# Patient Record
Sex: Female | Born: 1940 | Race: White | Hispanic: No | Marital: Married | Smoking: Never smoker
Health system: Southern US, Community
[De-identification: ages and names within clinical notes are randomized; demographics above are authoritative.]

## PROBLEM LIST (undated history)

## (undated) DIAGNOSIS — M542 Cervicalgia: Secondary | ICD-10-CM

## (undated) DIAGNOSIS — E039 Hypothyroidism, unspecified: Secondary | ICD-10-CM

## (undated) DIAGNOSIS — I951 Orthostatic hypotension: Secondary | ICD-10-CM

## (undated) DIAGNOSIS — N289 Disorder of kidney and ureter, unspecified: Secondary | ICD-10-CM

## (undated) DIAGNOSIS — F32A Depression, unspecified: Secondary | ICD-10-CM

## (undated) DIAGNOSIS — Z8659 Personal history of other mental and behavioral disorders: Secondary | ICD-10-CM

## (undated) DIAGNOSIS — G43909 Migraine, unspecified, not intractable, without status migrainosus: Secondary | ICD-10-CM

## (undated) DIAGNOSIS — F329 Major depressive disorder, single episode, unspecified: Secondary | ICD-10-CM

## (undated) DIAGNOSIS — M25512 Pain in left shoulder: Secondary | ICD-10-CM

## (undated) DIAGNOSIS — Z87898 Personal history of other specified conditions: Secondary | ICD-10-CM

## (undated) DIAGNOSIS — F419 Anxiety disorder, unspecified: Secondary | ICD-10-CM

## (undated) DIAGNOSIS — E785 Hyperlipidemia, unspecified: Secondary | ICD-10-CM

## (undated) DIAGNOSIS — I4891 Unspecified atrial fibrillation: Secondary | ICD-10-CM

## (undated) DIAGNOSIS — U071 COVID-19: Secondary | ICD-10-CM

## (undated) HISTORY — PX: COLONOSCOPY: SHX174

## (undated) HISTORY — DX: COVID-19: U07.1

## (undated) HISTORY — PX: ABDOMINAL HYSTERECTOMY: SHX81

## (undated) HISTORY — PX: THYROID SURGERY: SHX805

## (undated) HISTORY — DX: Orthostatic hypotension: I95.1

## (undated) HISTORY — PX: CHOLECYSTECTOMY: SHX55

## (undated) HISTORY — DX: Cervicalgia: M54.2

## (undated) HISTORY — PX: JOINT REPLACEMENT: SHX530

## (undated) HISTORY — DX: Anxiety disorder, unspecified: F41.9

## (undated) HISTORY — DX: Disorder of kidney and ureter, unspecified: N28.9

## (undated) HISTORY — DX: Personal history of other mental and behavioral disorders: Z86.59

## (undated) HISTORY — DX: Personal history of other specified conditions: Z87.898

## (undated) HISTORY — PX: TONSILLECTOMY: SUR1361

## (undated) HISTORY — PX: BREAST LUMPECTOMY: SHX2

## (undated) HISTORY — DX: Pain in left shoulder: M25.512

---

## 2002-09-10 ENCOUNTER — Encounter: Admission: RE | Admit: 2002-09-10 | Discharge: 2002-09-20 | Payer: Self-pay | Admitting: Orthopaedic Surgery

## 2007-12-19 ENCOUNTER — Encounter: Admission: RE | Admit: 2007-12-19 | Discharge: 2007-12-19 | Payer: Self-pay | Admitting: Neurology

## 2008-10-01 ENCOUNTER — Telehealth: Payer: Self-pay | Admitting: Internal Medicine

## 2008-10-01 ENCOUNTER — Encounter: Payer: Self-pay | Admitting: Internal Medicine

## 2008-10-02 ENCOUNTER — Ambulatory Visit: Payer: Self-pay | Admitting: Internal Medicine

## 2008-10-02 DIAGNOSIS — J302 Other seasonal allergic rhinitis: Secondary | ICD-10-CM

## 2008-10-02 DIAGNOSIS — R519 Headache, unspecified: Secondary | ICD-10-CM | POA: Insufficient documentation

## 2008-10-02 DIAGNOSIS — J42 Unspecified chronic bronchitis: Secondary | ICD-10-CM | POA: Insufficient documentation

## 2008-10-02 DIAGNOSIS — J329 Chronic sinusitis, unspecified: Secondary | ICD-10-CM | POA: Insufficient documentation

## 2008-10-02 DIAGNOSIS — R51 Headache: Secondary | ICD-10-CM

## 2008-10-02 DIAGNOSIS — J3089 Other allergic rhinitis: Secondary | ICD-10-CM

## 2008-10-02 LAB — CONVERTED CEMR LAB
Basophils Absolute: 0 10*3/uL (ref 0.0–0.1)
Basophils Relative: 0.2 % (ref 0.0–3.0)
Eosinophils Absolute: 0.3 10*3/uL (ref 0.0–0.7)
Eosinophils Relative: 2.8 % (ref 0.0–5.0)
HCT: 39 % (ref 36.0–46.0)
Hemoglobin: 13.5 g/dL (ref 12.0–15.0)
IgE (Immunoglobulin E), Serum: 3 intl units/mL (ref 0.0–180.0)
Lymphocytes Relative: 28.3 % (ref 12.0–46.0)
Lymphs Abs: 3.5 10*3/uL (ref 0.7–4.0)
MCHC: 34.6 g/dL (ref 30.0–36.0)
MCV: 91.9 fL (ref 78.0–100.0)
Monocytes Absolute: 1.5 10*3/uL — ABNORMAL HIGH (ref 0.1–1.0)
Monocytes Relative: 12.2 % — ABNORMAL HIGH (ref 3.0–12.0)
Neutro Abs: 7 10*3/uL (ref 1.4–7.7)
Neutrophils Relative %: 56.5 % (ref 43.0–77.0)
Platelets: 266 10*3/uL (ref 150.0–400.0)
RBC: 4.24 M/uL (ref 3.87–5.11)
RDW: 13 % (ref 11.5–14.6)
WBC: 12.3 10*3/uL — ABNORMAL HIGH (ref 4.5–10.5)

## 2008-10-04 ENCOUNTER — Telehealth: Payer: Self-pay | Admitting: Internal Medicine

## 2008-10-09 ENCOUNTER — Ambulatory Visit: Payer: Self-pay | Admitting: Internal Medicine

## 2008-10-15 ENCOUNTER — Telehealth (INDEPENDENT_AMBULATORY_CARE_PROVIDER_SITE_OTHER): Payer: Self-pay | Admitting: *Deleted

## 2008-10-16 ENCOUNTER — Ambulatory Visit: Payer: Self-pay | Admitting: Internal Medicine

## 2008-10-28 ENCOUNTER — Ambulatory Visit: Payer: Self-pay | Admitting: Internal Medicine

## 2009-03-20 ENCOUNTER — Ambulatory Visit: Payer: Self-pay | Admitting: Internal Medicine

## 2009-04-30 ENCOUNTER — Ambulatory Visit: Payer: Self-pay | Admitting: Internal Medicine

## 2009-09-04 ENCOUNTER — Encounter: Admission: RE | Admit: 2009-09-04 | Discharge: 2009-09-04 | Payer: Self-pay | Admitting: Neurology

## 2010-04-30 ENCOUNTER — Ambulatory Visit: Payer: Self-pay | Admitting: Internal Medicine

## 2011-10-20 ENCOUNTER — Other Ambulatory Visit: Payer: Self-pay | Admitting: Neurology

## 2011-10-20 DIAGNOSIS — R51 Headache: Secondary | ICD-10-CM

## 2011-10-20 DIAGNOSIS — R42 Dizziness and giddiness: Secondary | ICD-10-CM

## 2011-10-23 ENCOUNTER — Ambulatory Visit
Admission: RE | Admit: 2011-10-23 | Discharge: 2011-10-23 | Disposition: A | Discharge status: Home / Self Care | Payer: Medicare Other | Source: Ambulatory Visit | Attending: Neurology | Admitting: Neurology

## 2011-10-23 DIAGNOSIS — R51 Headache: Secondary | ICD-10-CM

## 2011-10-23 DIAGNOSIS — R42 Dizziness and giddiness: Secondary | ICD-10-CM

## 2012-01-11 ENCOUNTER — Emergency Department (HOSPITAL_BASED_OUTPATIENT_CLINIC_OR_DEPARTMENT_OTHER)
Admission: EM | Admit: 2012-01-11 | Discharge: 2012-01-11 | Disposition: A | Discharge status: Home / Self Care | Payer: Medicare Other | Attending: Emergency Medicine | Admitting: Emergency Medicine

## 2012-01-11 ENCOUNTER — Encounter (HOSPITAL_BASED_OUTPATIENT_CLINIC_OR_DEPARTMENT_OTHER): Payer: Self-pay

## 2012-01-11 ENCOUNTER — Emergency Department (HOSPITAL_BASED_OUTPATIENT_CLINIC_OR_DEPARTMENT_OTHER): Payer: Medicare Other

## 2012-01-11 DIAGNOSIS — E785 Hyperlipidemia, unspecified: Secondary | ICD-10-CM | POA: Insufficient documentation

## 2012-01-11 DIAGNOSIS — G43809 Other migraine, not intractable, without status migrainosus: Secondary | ICD-10-CM

## 2012-01-11 DIAGNOSIS — E079 Disorder of thyroid, unspecified: Secondary | ICD-10-CM | POA: Insufficient documentation

## 2012-01-11 DIAGNOSIS — R4182 Altered mental status, unspecified: Secondary | ICD-10-CM

## 2012-01-11 HISTORY — DX: Depression, unspecified: F32.A

## 2012-01-11 HISTORY — DX: Hyperlipidemia, unspecified: E78.5

## 2012-01-11 HISTORY — DX: Migraine, unspecified, not intractable, without status migrainosus: G43.909

## 2012-01-11 HISTORY — DX: Major depressive disorder, single episode, unspecified: F32.9

## 2012-01-11 HISTORY — DX: Hypothyroidism, unspecified: E03.9

## 2012-01-11 LAB — CBC WITH DIFFERENTIAL/PLATELET
Basophils Absolute: 0 10*3/uL (ref 0.0–0.1)
Basophils Relative: 0 % (ref 0–1)
Eosinophils Absolute: 0.2 10*3/uL (ref 0.0–0.7)
Eosinophils Relative: 3 % (ref 0–5)
Hemoglobin: 13.4 g/dL (ref 12.0–15.0)
MCV: 90.7 fL (ref 78.0–100.0)
Monocytes Absolute: 0.5 10*3/uL (ref 0.1–1.0)
Neutro Abs: 3.4 10*3/uL (ref 1.7–7.7)
RBC: 4.51 MIL/uL (ref 3.87–5.11)
WBC: 5.6 10*3/uL (ref 4.0–10.5)

## 2012-01-11 LAB — COMPREHENSIVE METABOLIC PANEL
ALT: 11 U/L (ref 0–35)
AST: 21 U/L (ref 0–37)
Alkaline Phosphatase: 58 U/L (ref 39–117)
GFR calc Af Amer: 73 mL/min — ABNORMAL LOW (ref >90)
Sodium: 137 mEq/L (ref 135–145)
Total Bilirubin: 0.3 mg/dL (ref 0.3–1.2)
Total Protein: 6.4 g/dL (ref 6.0–8.3)

## 2012-01-11 LAB — URINALYSIS, ROUTINE W REFLEX MICROSCOPIC
Hgb urine dipstick: NEGATIVE
Ketones, ur: NEGATIVE mg/dL
Leukocytes, UA: NEGATIVE
Nitrite: NEGATIVE
Protein, ur: NEGATIVE mg/dL
Urobilinogen, UA: 0.2 mg/dL (ref 0.0–1.0)
pH: 7 (ref 5.0–8.0)

## 2012-01-11 MED ORDER — SODIUM CHLORIDE 0.9 % IV BOLUS (SEPSIS)
1000.0000 mL | Freq: Once | INTRAVENOUS | Status: AC
  Administered 2012-01-11: 1000 mL via INTRAVENOUS

## 2012-01-11 NOTE — ED Notes (Signed)
Husband reports pt was CAOx4 at 11am today, he left the house, returned at 1400 and was confused and not acting right.

## 2012-01-11 NOTE — ED Notes (Signed)
Patient ambulatory to the restroom with assistance

## 2012-01-11 NOTE — ED Provider Notes (Signed)
History     CSN: [id number]  Arrival date & time 01/11/12  1557   First MD Initiated Contact with Patient 01/11/12 1630      Chief Complaint  Patient presents with  . Altered Mental Status    (Consider location/radiation/quality/duration/timing/severity/associated sxs/prior treatment) HPI Comments: Patient presents after husband found her to be confused and disoriented.  She was sitting in a recliner with spilled food on her chest and abdomen.  He left her around 11AM and returned to find her this way at about 2PM.  She reports having had a headache this morning but denies this at present.  There is no arm or leg weakness.    She has a history of migraines and recently was seen by Dr. Anne Hahn.  She had an mri about 3 months ago that was okay.    Patient is a 71 y.o. female presenting with altered mental status. The history is provided by the patient.  Altered Mental Status This is a new problem. The problem occurs constantly. The problem has not changed since onset.Associated symptoms include headaches. Pertinent negatives include no chest pain. Nothing aggravates the symptoms. Nothing relieves the symptoms. She has tried nothing for the symptoms. The treatment provided no relief.    Past Medical History  Diagnosis Date  . Migraines   . Thyroid disease   . Hyperlipidemia   . Depression   . Hypothyroid     Past Surgical History  Procedure Date  . Joint replacement   . Abdominal hysterectomy   . Thyroid surgery   . Cholecystectomy     No family history on file.  History  Substance Use Topics  . Smoking status: Never Smoker   . Smokeless tobacco: Never Used  . Alcohol Use: No    OB History    Grav Para Term Preterm Abortions TAB SAB Ect Mult Living                  Review of Systems  Cardiovascular: Negative for chest pain.  Neurological: Positive for headaches.  Psychiatric/Behavioral: Positive for altered mental status.  All other systems reviewed and are  negative.    Allergies  Review of patient's allergies indicates no known allergies.  Home Medications  No current outpatient prescriptions on file.  BP 122/67  Pulse 65  Resp 16  Ht 5\' 8"  (1.727 m)  Wt 210 lb (95.255 kg)  BMI 31.93 kg/m2  SpO2 97%  Physical Exam  Nursing note and vitals reviewed. Constitutional: She is oriented to person, place, and time. She appears well-developed and well-nourished. No distress.  HENT:  Head: Normocephalic and atraumatic.  Mouth/Throat: Oropharynx is clear and moist.  Eyes: EOM are normal. Pupils are equal, round, and reactive to light.  Neck: Normal range of motion. Neck supple.  Cardiovascular: Normal rate and regular rhythm.   No murmur heard. Pulmonary/Chest: Effort normal and breath sounds normal. No respiratory distress. She has no wheezes.  Abdominal: Soft. Bowel sounds are normal. She exhibits no distension. There is no tenderness.  Musculoskeletal: Normal range of motion. She exhibits no edema.  Neurological: She is alert and oriented to person, place, and time. No cranial nerve deficit. Coordination normal.  Skin: Skin is warm and dry. She is not diaphoretic.    ED Course  Procedures (including critical care time)   Labs Reviewed  CBC WITH DIFFERENTIAL  COMPREHENSIVE METABOLIC PANEL  URINALYSIS, ROUTINE W REFLEX MICROSCOPIC   No results found.   No diagnosis found.  MDM  The patient presents here for eval after her husband left for a while and found her altered at home in a recliner.  She came here with confusion, then complained of double vision.  Her ct of the head was okay and the labs were unremarkable.  Her symptoms persisted and I elected to pursue this with an mri even though she had a negative in the recent past.  This was unremarkable today as well.  She is feeling better and her symptoms have resolved.  I believe she is stable for discharge with the presumptive diagnosis of complex migraine.  Also considered  was tia/cva which I feel the mri has ruled out.  She was encourage to expedite follow up with Dr. Anne Hahn in the future.        Geoffery Lyons, MD 01/11/12 2004

## 2012-01-11 NOTE — ED Notes (Signed)
Patient transported to MRI 

## 2012-01-11 NOTE — ED Notes (Signed)
Pt. Reports for 1 wk she has been having diarrhea every morning.  Pt. Husband said he came home and found her in the recliner asleep.  Pt. At times is unable to remember things.  Pt. Daughter is nurse at [employer] long.

## 2012-01-11 NOTE — ED Notes (Signed)
Pt. Is unstedy on her feet when standing.

## 2012-03-01 ENCOUNTER — Ambulatory Visit (INDEPENDENT_AMBULATORY_CARE_PROVIDER_SITE_OTHER): Payer: Medicare Other | Admitting: Cardiology

## 2012-03-01 ENCOUNTER — Encounter: Payer: Self-pay | Admitting: Cardiology

## 2012-03-01 VITALS — BP 110/82 | HR 77 | Ht 67.0 in | Wt 205.0 lb

## 2012-03-01 DIAGNOSIS — E785 Hyperlipidemia, unspecified: Secondary | ICD-10-CM

## 2012-03-01 DIAGNOSIS — R51 Headache: Secondary | ICD-10-CM

## 2012-03-01 DIAGNOSIS — I951 Orthostatic hypotension: Secondary | ICD-10-CM

## 2012-03-01 NOTE — Progress Notes (Signed)
HPI: 71 year old female for evaluation of syncope and orthostasis. The patient typically does not have dyspnea on exertion, orthopnea, PND, pedal edema, chest pain or palpitations. On 01/11/2012 the patient was in her kitchen fixing lunch. She has no recall of any events for the subsequent 5 hours. Her husband found her confused. She was taken to the emergency room. She ultimately had an MRI/MRA that was normal. An EEG was unremarkable. Hemoglobin and renal function were normal. Urinalysis negative. She has had no further events. She does state that over the past 4-5 months she notices dizziness with standing. She has not had syncope. There is no associated chest pain or palpitations. Her symptoms resolved after standing for several minutes. If she works for 30 minutes she does begin to notice lightheadedness requiring her to sit down as well. She has lost 10 pounds over the past one year. Her appetite is not good. She has loose bowel movements but no diarrhea. She does not consume significant amounts of fluid. Because of the above cardiology is asked to evaluate. Note neurology has ordered a cortisol level with results pending.  Current Outpatient Prescriptions  Medication Sig Dispense Refill  . buPROPion (WELLBUTRIN XL) 150 MG 24 hr tablet Take 150 mg by mouth daily.      . divalproex (DEPAKOTE) 500 MG DR tablet Take 500 mg by mouth daily.      Marland Kitchen gabapentin (NEURONTIN) 300 MG capsule Take 600 mg by mouth 2 (two) times daily.      Marland Kitchen levothyroxine (SYNTHROID, LEVOTHROID) 150 MCG tablet Take 150 mcg by mouth daily.      . Multiple Vitamins-Minerals (ICAPS PO) Take 1 tablet by mouth daily.      . Omega-3 Fatty Acids (FISH OIL PO) Take 4 tablets by mouth daily.      . sertraline (ZOLOFT) 100 MG tablet 1 1/2  Tab po qd      . simvastatin (ZOCOR) 40 MG tablet Take 40 mg by mouth every evening.      . SUMAtriptan (IMITREX) 100 MG tablet Take 100 mg by mouth every 2 (two) hours as needed.      . zolpidem  (AMBIEN) 10 MG tablet Take 10 mg by mouth at bedtime as needed.        No Known Allergies  Past Medical History  Diagnosis Date  . Migraines   . Hyperlipidemia   . Depression   . Hypothyroid   . Renal insufficiency     Past Surgical History  Procedure Date  . Joint replacement   . Abdominal hysterectomy   . Thyroid surgery   . Cholecystectomy   . Tonsillectomy     History   Social History  . Marital Status: Married    Spouse Name: N/A    Number of Children: 2  . Years of Education: N/A   Occupational History  .     Social History Main Topics  . Smoking status: Never Smoker   . Smokeless tobacco: Never Used  . Alcohol Use: No  . Drug Use: No  . Sexually Active:    Other Topics Concern  . Not on file   Social History Narrative  . No narrative on file    Family History  Problem Relation Age of Onset  . Heart disease Mother     Angina    ROS: no fevers or chills, productive cough, hemoptysis, dysphasia, odynophagia, melena, hematochezia, dysuria, hematuria, rash, seizure activity, orthopnea, PND, pedal edema, claudication. Remaining systems are negative.  Physical  Exam:   Blood pressure 110/82, pulse 77, height 5\' 7"  (1.702 m), weight 205 lb (92.987 kg).  General:  Well developed/well nourished in NAD Skin warm/dry Patient not depressed No peripheral clubbing Back-normal HEENT-normal/normal eyelids Neck supple/normal carotid upstroke bilaterally; no bruits; no JVD; no thyromegaly chest - CTA/ normal expansion CV - RRR/normal S1 and S2; no murmurs, rubs or gallops;  PMI nondisplaced Abdomen -NT/ND, no HSM, no mass, + bowel sounds, no bruit 2+ femoral pulses, no bruits Ext-no edema, chords, 2+ DP Neuro-grossly nonfocal  ECG sinus rhythm at a rate of 78. Nonspecific ST changes.

## 2012-03-01 NOTE — Assessment & Plan Note (Signed)
Patient is describing orthostatic symptoms. She was noted to be orthostatic when evaluated by neurology and is mildly orthostatic today with SBP lying 109 and standing 98. We will plan an echocardiogram to assess LV function. Note it is not clear that she had syncope on August 27 as she apparently was confused for 5 hours. This would make cardiac etiology seem unlikely. Because of her orthostasis I have asked her to increase her by mouth fluid intake and sodium intake. Her laboratories were normal. A cortisol level is pending. If she has continuing symptoms despite the above measures we will plan potentially to Florinef or midodrine.

## 2012-03-01 NOTE — Assessment & Plan Note (Signed)
Continue statin. 

## 2012-03-01 NOTE — Patient Instructions (Addendum)
Your physician recommends that you schedule a follow-up appointment in: 6-8 WEEKS WITH DR Jens Som IN [place]  Your physician has requested that you have an echocardiogram. Echocardiography is a painless test that uses sound waves to create images of your heart. It provides your doctor with information about the size and shape of your heart and how well your heart's chambers and valves are working. This procedure takes approximately one hour. There are no restrictions for this procedure.AT THE [place] OFFICE

## 2012-03-01 NOTE — Assessment & Plan Note (Signed)
Management per neurology.

## 2012-03-09 ENCOUNTER — Ambulatory Visit (HOSPITAL_COMMUNITY): Payer: Medicare Other | Attending: Cardiovascular Disease

## 2012-03-09 DIAGNOSIS — I951 Orthostatic hypotension: Secondary | ICD-10-CM | POA: Insufficient documentation

## 2012-03-09 DIAGNOSIS — R55 Syncope and collapse: Secondary | ICD-10-CM

## 2012-03-09 DIAGNOSIS — I369 Nonrheumatic tricuspid valve disorder, unspecified: Secondary | ICD-10-CM | POA: Insufficient documentation

## 2012-03-09 DIAGNOSIS — I379 Nonrheumatic pulmonary valve disorder, unspecified: Secondary | ICD-10-CM | POA: Insufficient documentation

## 2012-03-09 DIAGNOSIS — E785 Hyperlipidemia, unspecified: Secondary | ICD-10-CM

## 2012-03-09 DIAGNOSIS — R51 Headache: Secondary | ICD-10-CM

## 2012-03-09 NOTE — Progress Notes (Signed)
Echocardiogram performed.  

## 2012-03-13 ENCOUNTER — Ambulatory Visit: Payer: 59 | Admitting: Cardiology

## 2012-03-16 ENCOUNTER — Telehealth: Payer: Self-pay | Admitting: Cardiology

## 2012-03-16 NOTE — Telephone Encounter (Signed)
Spoke with pt, aware of echo results. 

## 2012-03-16 NOTE — Telephone Encounter (Signed)
New Problem:    Patient returned your call regarding her latest ECHO.  Please call back.

## 2012-03-17 ENCOUNTER — Encounter: Payer: Self-pay | Admitting: Internal Medicine

## 2012-03-17 ENCOUNTER — Ambulatory Visit (INDEPENDENT_AMBULATORY_CARE_PROVIDER_SITE_OTHER)
Admission: RE | Admit: 2012-03-17 | Discharge: 2012-03-17 | Disposition: A | Discharge status: Home / Self Care | Payer: Medicare Other | Source: Ambulatory Visit | Attending: Internal Medicine | Admitting: Internal Medicine

## 2012-03-17 ENCOUNTER — Telehealth: Payer: Self-pay | Admitting: Internal Medicine

## 2012-03-17 ENCOUNTER — Ambulatory Visit (INDEPENDENT_AMBULATORY_CARE_PROVIDER_SITE_OTHER): Payer: Medicare Other | Admitting: Internal Medicine

## 2012-03-17 VITALS — BP 112/74 | HR 97 | Ht 67.0 in | Wt 208.0 lb

## 2012-03-17 DIAGNOSIS — J209 Acute bronchitis, unspecified: Secondary | ICD-10-CM

## 2012-03-17 DIAGNOSIS — J329 Chronic sinusitis, unspecified: Secondary | ICD-10-CM

## 2012-03-17 DIAGNOSIS — J42 Unspecified chronic bronchitis: Secondary | ICD-10-CM

## 2012-03-17 MED ORDER — DOXYCYCLINE HYCLATE 100 MG PO TABS: ORAL_TABLET | ORAL | Status: DC

## 2012-03-17 MED ORDER — METHYLPREDNISOLONE ACETATE 80 MG/ML IJ SUSP
80.0000 mg | Freq: Once | INTRAMUSCULAR | Status: AC
  Administered 2012-03-17: 80 mg via INTRAMUSCULAR

## 2012-03-17 MED ORDER — PROMETHAZINE-CODEINE 6.25-10 MG/5ML PO SYRP: 5.0000 mL | ORAL_SOLUTION | Freq: Four times a day (QID) | ORAL | Status: DC | PRN

## 2012-03-17 MED ORDER — LEVALBUTEROL HCL 0.63 MG/3ML IN NEBU
0.6300 mg | INHALATION_SOLUTION | Freq: Once | RESPIRATORY_TRACT | Status: AC
  Administered 2012-03-17: 0.63 mg via RESPIRATORY_TRACT

## 2012-03-17 NOTE — Telephone Encounter (Signed)
Pt's daughter, Angelique Blonder,  called back.  Set appt today w/ CY at 1:45 pm.  Verbalized understanding & stated nothing further needed at this time.  Antionette Fairy

## 2012-03-17 NOTE — Patient Instructions (Addendum)
Neb xop 0.63  Depo 80  Order CXR- dx acute bronchitis  Script for promethazine with codeine cough syrup for use if needed

## 2012-03-17 NOTE — Telephone Encounter (Signed)
[id number] to offer 1:45 appt today with CY. Carron Curie, CMA

## 2012-03-17 NOTE — Progress Notes (Signed)
03/17/12- 71 yoF never smoker followed for rhinitis, bronchitis  Husband here LOV- 04/30/09  has had flu vaccine. Had pneumonia vaccine 2011. PCP Dr Clide Cliff [place]ut[place]/ [place] Acute visit-for 2 weeks has had sore throat progressing to chest congestion sinus congestion. Had pneumonia treated as an outpatient in spring of 2013 so she is concerned. 1 week ago her primary physician gave Z-Pak and albuterol which are now finished. She felt worse 3 days ago but today complains of wheeze, scant phlegm with dry cough and no fever or sweat. Frontal headache with pressure has resolved. She did take Sudafed  ROS-see HPI Constitutional:   No-   weight loss, night sweats, fevers, chills, fatigue, lassitude. HEENT:   No-  headaches, difficulty swallowing, tooth/dental problems, sore throat,       No-  sneezing, itching, ear ache, +nasal congestion, post nasal drip,  CV:  No-   chest pain, orthopnea, PND, swelling in lower extremities, anasarca, dizziness, palpitations Resp: + shortness of breath with exertion or at rest.              No-   productive cough,  + non-productive cough,  No- coughing up of blood.              No-   change in color of mucus.  No- wheezing.   Skin: No-   rash or lesions. GI:  No-   heartburn, indigestion, abdominal pain, nausea, vomiting, GU:  MS:  No-   joint pain or swelling.   Neuro-     nothing unusual Psych:  No- change in mood or affect. No depression or anxiety.  No memory loss.  OBJ- Physical Exam General- Alert, Oriented, Affect-appropriate, Distress- none acute. Looks pale and tired Skin- rash-none, lesions- none, excoriation- none Lymphadenopathy- none Head- atraumatic            Eyes- Gross vision intact, PERRLA, conjunctivae and secretions clear            Ears- Hearing, canals-normal            Nose- Clear, no-Septal dev, mucus, polyps, erosion, perforation             Throat- Mallampati II , mucosa clear , drainage- none, tonsils- atrophic Neck- flexible ,  trachea midline, no stridor , thyroid nl, carotid no bruit Chest - symmetrical excursion , unlabored           Heart/CV- RRR , no murmur , no gallop  , no rub, nl s1 s2                           - JVD- none , edema- none, stasis changes- none, varices- none           Lung- clear to P&A, wheeze-+ mild, + coarse raspy cough , dullness-none, rub- none           Chest wall-  Abd-  Br/ Gen/ Rectal- Not done, not indicated Extrem- cyanosis- none, clubbing, none, atrophy- none, strength- nl Neuro- grossly intact to observation

## 2012-03-20 ENCOUNTER — Telehealth: Payer: Self-pay | Admitting: Internal Medicine

## 2012-03-20 MED ORDER — HYDROCOD POLST-CHLORPHEN POLST 10-8 MG/5ML PO LQCR: ORAL | Status: DC

## 2012-03-20 MED ORDER — AMOXICILLIN 500 MG PO TABS: 500.0000 mg | ORAL_TABLET | Freq: Three times a day (TID) | ORAL | Status: DC

## 2012-03-20 NOTE — Telephone Encounter (Signed)
Per CY - Tussionex 1 tsp Q 12hrs PRN, Amoxicillin 500mg  1 TID x7days #21  I have spoke to pt husband and he is aware that we will send in medications.

## 2012-03-20 NOTE — Telephone Encounter (Signed)
I spoke with spouse and he stated pt saw CDY on 03/17/12. Her breathing is slightly better this morning but she is still wheezing, and coughing up yellow-brown phlem. Sometimes pt coughs for a constant hour at a time. The promethazine w/ codeine cough syrup is not helping. Spouse is requesting further recs. Please advise Dr. Maple Hudson thanks  No Known Allergies

## 2012-03-23 ENCOUNTER — Telehealth: Payer: Self-pay | Admitting: Internal Medicine

## 2012-03-23 MED ORDER — AZITHROMYCIN 250 MG PO TABS: ORAL_TABLET | ORAL | Status: DC

## 2012-03-23 MED ORDER — PHENYLEPH-PROMETHAZINE-COD 5-6.25-10 MG/5ML PO SYRP: ORAL_SOLUTION | ORAL | Status: DC

## 2012-03-23 NOTE — Telephone Encounter (Signed)
I have spoke with patients husband, he states that patients cough is still not improving even with the added amoxicillin and tussionex on 03/20/12.  Patient is still having nonproductive dry cough, wheezing and now has low grade temp at 100 degrees. Only meds patient has taken for these symptoms are the ones prescribed above. I have asked husband does patient feel she needs to come back in to be seen, however patient would like to know what we be different in her coming in.  Patient would like more recs from Dr. Maple Hudson for these symptoms.  Dr. Maple Hudson please advise, thank you.  Last OV: 03/17/12 Next OV: 03/20/13  No Known Allergies

## 2012-03-23 NOTE — Telephone Encounter (Signed)
Per CY-please have patient continue Amoxicillin and add Zpak #1 take as directed with it. Also, give Promethazine with codeine cough syrup # 1 tsp every 6 hours prn cough no refills. Lots of FLUIDS

## 2012-03-23 NOTE — Telephone Encounter (Signed)
Pt's husband given Dr Roxy Cedar instructions and verified that rx was sent to [facility].

## 2012-03-29 NOTE — Assessment & Plan Note (Signed)
Mild rhinitis today should respond to some of the treatment being given for chest. We will wait to see how she does

## 2012-03-29 NOTE — Assessment & Plan Note (Signed)
Acute asthmatic bronchitis Plan cough syrup, nebulizer treatment, Depo-Medrol, chest x-ray

## 2012-04-18 DIAGNOSIS — F29 Unspecified psychosis not due to a substance or known physiological condition: Secondary | ICD-10-CM | POA: Insufficient documentation

## 2012-04-18 DIAGNOSIS — M79609 Pain in unspecified limb: Secondary | ICD-10-CM | POA: Insufficient documentation

## 2012-04-18 DIAGNOSIS — M19019 Primary osteoarthritis, unspecified shoulder: Secondary | ICD-10-CM | POA: Insufficient documentation

## 2012-04-18 DIAGNOSIS — Z5181 Encounter for therapeutic drug level monitoring: Secondary | ICD-10-CM | POA: Insufficient documentation

## 2012-04-18 DIAGNOSIS — M47812 Spondylosis without myelopathy or radiculopathy, cervical region: Secondary | ICD-10-CM | POA: Insufficient documentation

## 2012-04-26 ENCOUNTER — Encounter: Payer: Self-pay | Admitting: Cardiology

## 2012-04-26 ENCOUNTER — Ambulatory Visit (INDEPENDENT_AMBULATORY_CARE_PROVIDER_SITE_OTHER): Payer: Medicare Other | Admitting: Cardiology

## 2012-04-26 VITALS — BP 122/80 | HR 94 | Ht 67.0 in | Wt 187.0 lb

## 2012-04-26 DIAGNOSIS — R55 Syncope and collapse: Secondary | ICD-10-CM

## 2012-04-26 DIAGNOSIS — E785 Hyperlipidemia, unspecified: Secondary | ICD-10-CM

## 2012-04-26 DIAGNOSIS — R42 Dizziness and giddiness: Secondary | ICD-10-CM

## 2012-04-26 NOTE — Patient Instructions (Addendum)
Your physician recommends that you schedule a follow-up appointment in: 8 WEEKS WITH DR CRENSHAW IN [place]  Your physician has recommended that you wear an event monitor. Event monitors are medical devices that record the heart's electrical activity. Doctors most often [place] these monitors to diagnose arrhythmias. Arrhythmias are problems with the speed or rhythm of the heartbeat. The monitor is a small, portable device. You can wear one while you do your normal daily activities. This is usually used to diagnose what is causing palpitations/syncope (passing out). WILL BE MAILED TO YOUR HOME.

## 2012-04-26 NOTE — Assessment & Plan Note (Signed)
Management per primary care. 

## 2012-04-26 NOTE — Progress Notes (Signed)
HPI: 71 year old female for fu of orthostasic hypotension. On 01/11/2012 the patient was in her kitchen fixing lunch. She has no recall of any events for the subsequent 5 hours. Her husband found her confused. She was taken to the emergency room. She ultimately had an MRI/MRA that was normal. An EEG was unremarkable. Hemoglobin and renal function were normal. Urinalysis negative. Echocardiogram in October 2013 showed normal LV function. When I previously saw her we asked her to increase her sodium and fluid intake. Note further laboratories showed a low TSH and her cortisol level was 17.6. Since she was last seen, she feels somewhat better. She does state that she has had several less severe episodes of dizziness. She was walking in the store and suddenly felt the energy rushing out. She felt like she was going to pass out. She felt her heart rate slowed down. No chest pain or dyspnea. No syncope. She sat down and she felt like she was going to pass out for approximately 10 minutes and then fell week for another 30 minutes. She has some dyspnea on exertion but no orthopnea or PND. No exertional chest pain.   Current Outpatient Prescriptions  Medication Sig Dispense Refill  . buPROPion (WELLBUTRIN XL) 150 MG 24 hr tablet Take 150 mg by mouth daily.      . divalproex (DEPAKOTE) 500 MG DR tablet Take 500 mg by mouth daily.      Marland Kitchen gabapentin (NEURONTIN) 300 MG capsule Take 600 mg by mouth 2 (two) times daily.      Marland Kitchen levothyroxine (SYNTHROID, LEVOTHROID) 150 MCG tablet Take 150 mcg by mouth daily.      . NON FORMULARY EYE VITAMIN  1 TAB PO QD      . Omega-3 Fatty Acids (FISH OIL PO) Take 4 tablets by mouth daily.      . sertraline (ZOLOFT) 100 MG tablet 1 1/2  Tab po qd      . simvastatin (ZOCOR) 40 MG tablet Take 40 mg by mouth every evening.      . SUMAtriptan (IMITREX) 100 MG tablet Take 100 mg by mouth every 2 (two) hours as needed.      . zolpidem (AMBIEN) 10 MG tablet Take 10 mg by mouth at  bedtime as needed.         Past Medical History  Diagnosis Date  . Migraines   . Hyperlipidemia   . Depression   . Hypothyroid   . Renal insufficiency   . Orthostatic hypotension     Past Surgical History  Procedure Date  . Joint replacement   . Abdominal hysterectomy   . Thyroid surgery   . Cholecystectomy   . Tonsillectomy     History   Social History  . Marital Status: Married    Spouse Name: N/A    Number of Children: 2  . Years of Education: N/A   Occupational History  .     Social History Main Topics  . Smoking status: Never Smoker   . Smokeless tobacco: Never Used  . Alcohol Use: No  . Drug Use: No  . Sexually Active:    Other Topics Concern  . Not on file   Social History Narrative  . No narrative on file    ROS: no fevers or chills, productive cough, hemoptysis, dysphasia, odynophagia, melena, hematochezia, dysuria, hematuria, rash, seizure activity, orthopnea, PND, pedal edema, claudication. Remaining systems are negative.  Physical Exam: Well-developed well-nourished in no acute distress.  Skin is warm and  dry.  HEENT is normal.  Neck is supple.  Chest is clear to auscultation with normal expansion.  Cardiovascular exam is regular rate and rhythm.  Abdominal exam nontender or distended. No masses palpated. Extremities show no edema. neuro grossly intact

## 2012-04-26 NOTE — Assessment & Plan Note (Signed)
Etiology unclear. She is a difficult historian. She does state that she feels transiently like she will pass out but those symptoms last 10 minutes. She also stated her heart rate slows down. I will put an event monitor in place. Note her LV function is normal.

## 2012-04-28 ENCOUNTER — Telehealth: Payer: Self-pay | Admitting: Internal Medicine

## 2012-04-28 MED ORDER — AZITHROMYCIN 250 MG PO TABS: ORAL_TABLET | ORAL | Status: DC

## 2012-04-28 MED ORDER — PREDNISONE 10 MG PO TABS: ORAL_TABLET | ORAL | Status: DC

## 2012-04-28 NOTE — Telephone Encounter (Signed)
Per CY---pred 8 day taper and zpak.  This has been sent to the pts pharmacy and i called and spoke with pt and she is aware of meds sent to her pharmacy.  Nothing further is needed.

## 2012-04-28 NOTE — Telephone Encounter (Signed)
Called and spoke with pt and she stated that she has had a cough x 2-3 days but last night and this morning she is worse.  Cough with yellow sputum, wheezing last night was bad.  Denies any fever.  Pt is requesting recs from CY.  Please advise. Thanks  Last ov  03/17/2012 Next ov--03/20/2013   No Known Allergies

## 2012-05-01 ENCOUNTER — Telehealth: Payer: Self-pay | Admitting: *Deleted

## 2012-05-01 NOTE — Telephone Encounter (Signed)
Pt was enrolled for event monitor to be mailed home. 05/01/12. TK

## 2012-05-06 ENCOUNTER — Encounter: Payer: Self-pay | Admitting: Cardiology

## 2012-05-31 ENCOUNTER — Encounter: Payer: Self-pay | Admitting: Cardiology

## 2012-05-31 ENCOUNTER — Ambulatory Visit (INDEPENDENT_AMBULATORY_CARE_PROVIDER_SITE_OTHER): Payer: Medicare Other | Admitting: Cardiology

## 2012-05-31 VITALS — BP 122/82 | HR 90 | Ht 67.0 in | Wt 209.0 lb

## 2012-05-31 DIAGNOSIS — R42 Dizziness and giddiness: Secondary | ICD-10-CM

## 2012-05-31 DIAGNOSIS — E785 Hyperlipidemia, unspecified: Secondary | ICD-10-CM

## 2012-05-31 NOTE — Assessment & Plan Note (Signed)
Patient is orthostatic in the office today. Her systolic blood pressure lying was 135 with a diastolic of 79. Pulse 59. Standing she was 100/71 with a pulse of 81. She did begin to feel weakness with this. Her monitor was unremarkable. Echocardiogram showed normal LV function. Long discussion today about measures to avoid above. She's not drinking fluid as much as she should by her report. Increase by mouth fluid intake and sodium intake. I recommended compression hose. If her symptoms do not improve we will consider midodrine.

## 2012-05-31 NOTE — Patient Instructions (Addendum)
Your physician recommends that you schedule a follow-up appointment in: 3 MONTHS WITH DR CRENSHAW IN [place]  

## 2012-05-31 NOTE — Assessment & Plan Note (Signed)
Management per primary care. 

## 2012-05-31 NOTE — Progress Notes (Signed)
HPI: 72 year old female for fu of dizziness. On 01/11/2012 the patient was in her kitchen fixing lunch. She has no recall of any events for the subsequent 5 hours. Her husband found her confused. She was taken to the emergency room. She ultimately had an MRI/MRA that was normal. An EEG was unremarkable. Hemoglobin and renal function were normal. Urinalysis negative. Echocardiogram in October 2013 showed normal LV function. Note further laboratories showed a low TSH and her cortisol level was 17.6. We previously recommended increase sodium and fluid intake for possible orthostasis. A monitor was performed in December 2013 and showed sinus rhythm with a rare PVC. Since last saw her she continues to have dizzy spells. These always occur when she is standing but she can be up for 30 minutes prior to the event. She suddenly feels weak and lightheaded. There is no frank syncope. Her symptoms improved with sitting down. There is some dyspnea but no chest pain. No palpitations. There are no other neurological symptoms.   Current Outpatient Prescriptions  Medication Sig Dispense Refill  . buPROPion (WELLBUTRIN XL) 150 MG 24 hr tablet Take 150 mg by mouth daily.      . divalproex (DEPAKOTE) 500 MG DR tablet Take 500 mg by mouth daily.      Marland Kitchen gabapentin (NEURONTIN) 300 MG capsule Take 600 mg by mouth 2 (two) times daily.      Marland Kitchen levothyroxine (SYNTHROID, LEVOTHROID) 150 MCG tablet Take 150 mcg by mouth daily.      . NON FORMULARY EYE VITAMIN  1 TAB PO QD      . Omega-3 Fatty Acids (FISH OIL PO) Take 4 tablets by mouth daily.      . sertraline (ZOLOFT) 100 MG tablet 1 1/2  Tab po qd      . simvastatin (ZOCOR) 40 MG tablet Take 40 mg by mouth every evening.      . SUMAtriptan (IMITREX) 100 MG tablet Take 100 mg by mouth every 2 (two) hours as needed.      . zolpidem (AMBIEN) 10 MG tablet Take 10 mg by mouth at bedtime as needed.         Past Medical History  Diagnosis Date  . Migraines   . Hyperlipidemia    . Depression   . Hypothyroid   . Renal insufficiency   . Orthostatic hypotension     Past Surgical History  Procedure Date  . Joint replacement   . Abdominal hysterectomy   . Thyroid surgery   . Cholecystectomy   . Tonsillectomy     History   Social History  . Marital Status: Married    Spouse Name: N/A    Number of Children: 2  . Years of Education: N/A   Occupational History  .     Social History Main Topics  . Smoking status: Never Smoker   . Smokeless tobacco: Never Used  . Alcohol Use: No  . Drug Use: No  . Sexually Active:    Other Topics Concern  . Not on file   Social History Narrative  . No narrative on file    ROS: no fevers or chills, productive cough, hemoptysis, dysphasia, odynophagia, melena, hematochezia, dysuria, hematuria, rash, seizure activity, orthopnea, PND, pedal edema, claudication. Remaining systems are negative.  Physical Exam: Well-developed well-nourished in no acute distress.  Skin is warm and dry.  HEENT is normal.  Neck is supple.  Chest is clear to auscultation with normal expansion.  Cardiovascular exam is regular rate and rhythm.  Abdominal exam nontender or distended. No masses palpated. Extremities show no edema. neuro grossly intact

## 2012-06-28 ENCOUNTER — Ambulatory Visit: Payer: Medicare Other | Admitting: Cardiology

## 2012-08-23 ENCOUNTER — Telehealth: Payer: Self-pay | Admitting: *Deleted

## 2012-08-23 MED ORDER — PREDNISONE 5 MG PO TABS: ORAL_TABLET | ORAL | Status: DC

## 2012-08-23 MED ORDER — CYCLOBENZAPRINE HCL 10 MG PO TABS: 10.0000 mg | ORAL_TABLET | Freq: Every day | ORAL | Status: DC

## 2012-08-23 NOTE — Telephone Encounter (Signed)
I called patient. The patient has had an increase in her neck pain over the last 4 or 5 days. Patient has chronic neck discomfort, and a history of cervical spondylosis. Within the last day or 2, the patient has had intermittent dull pain behind the ear on the left. This may be a manifestation of an occipital nerve irritation involving the lesser branch. The episodes of pain lasts only a few moments, and then clears. The patient will be placed on a prednisone Dosepak, and Flexeril to take 10 mg at night over the next 2 weeks. The patient will contact me if her symptoms worsen, and she will be sent for physical therapy, and MRI evaluation of the cervical spine. The patient is not having pain down the arm at this point.

## 2012-08-23 NOTE — Telephone Encounter (Signed)
Patient called statng she is having one spot on her head that hurts and the pain is coming from her neck to the spot on her head. Patient would like to speak with physician.

## 2012-08-31 ENCOUNTER — Telehealth: Payer: Self-pay | Admitting: *Deleted

## 2012-08-31 MED ORDER — OXCARBAZEPINE 150 MG PO TABS: 150.0000 mg | ORAL_TABLET | Freq: Two times a day (BID) | ORAL | Status: DC

## 2012-08-31 NOTE — Telephone Encounter (Signed)
I called patient. The patient has had good improvement with the prednisone in terms of treating the neck pain. Initially, the right side occipital neuralgia went away, but now it has come back. The patient is unable to take nonsteroidal anti-inflammatory medications secondary to chronic renal insufficiency. The patient will be placed on low-dose Trileptal. If this is not effective, the patient may require injections.

## 2012-08-31 NOTE — Telephone Encounter (Signed)
Patient called stating the pain she was having in her head that traveled up from her neck has returned, but the neck pain itself is okay.  The one nerve has returned.

## 2012-09-06 ENCOUNTER — Encounter: Payer: Self-pay | Admitting: Cardiology

## 2012-09-06 ENCOUNTER — Ambulatory Visit: Payer: Medicare Other | Admitting: Cardiology

## 2012-09-06 ENCOUNTER — Ambulatory Visit (INDEPENDENT_AMBULATORY_CARE_PROVIDER_SITE_OTHER): Payer: Medicare Other | Admitting: Cardiology

## 2012-09-06 VITALS — BP 124/86 | HR 72 | Wt 217.0 lb

## 2012-09-06 DIAGNOSIS — E785 Hyperlipidemia, unspecified: Secondary | ICD-10-CM

## 2012-09-06 DIAGNOSIS — I951 Orthostatic hypotension: Secondary | ICD-10-CM

## 2012-09-06 NOTE — Assessment & Plan Note (Signed)
Her symptoms are much improved with forcing herself to increase by mouth fluid intake and increasing sodium intake. She also wears compression hose. She will continue with the above maneuvers. If she has symptoms despite the above measures in the future we could consider midodrine.

## 2012-09-06 NOTE — Patient Instructions (Signed)
Your physician wants you to follow-up in: 6 MONTHS WITH DR CRENSHAW IN [place] You will receive a reminder letter in the mail two months in advance. If you don't receive a letter, please call our office to schedule the follow-up appointment.  

## 2012-09-06 NOTE — Assessment & Plan Note (Signed)
Continue statin. Management per primary care. 

## 2012-09-06 NOTE — Progress Notes (Signed)
HPI: Pleasant female for fu of orthostasis. On 01/11/2012 the patient was in her kitchen fixing lunch. She has no recall of any events for the subsequent 5 hours. Her husband found her confused. She was taken to the emergency room. She ultimately had an MRI/MRA that was normal. An EEG was unremarkable. Hemoglobin and renal function were normal. Urinalysis negative. Echocardiogram in October 2013 showed normal LV function. Note further laboratories showed a low TSH and her cortisol level was 17.6. We previously recommended increase sodium and fluid intake for possible orthostasis. A monitor was performed in December 2013 and showed sinus rhythm with a rare PVC. Patient noted to be orthostatic at time of last ov in Jan 2014. Increased fluid intake recommended. Since then, the patient has dyspnea with more extreme activities but not with routine activities. It is relieved with rest. It is not associated with chest pain. There is no orthopnea, PND or pedal edema. There is no syncope or palpitations. There is no exertional chest pain. Her dizziness with standing is much improved and she is forcing herself to increase fluid and sodium intake. She is also wearing compression hose.     Current Outpatient Prescriptions  Medication Sig Dispense Refill  . buPROPion (WELLBUTRIN XL) 150 MG 24 hr tablet Take 150 mg by mouth daily.      . cyclobenzaprine (FLEXERIL) 10 MG tablet Take 1 tablet (10 mg total) by mouth at bedtime.  20 tablet  1  . divalproex (DEPAKOTE) 500 MG DR tablet Take 500 mg by mouth daily.      Marland Kitchen gabapentin (NEURONTIN) 300 MG capsule Take 600 mg by mouth 2 (two) times daily.      Marland Kitchen levothyroxine (SYNTHROID, LEVOTHROID) 150 MCG tablet Take 150 mcg by mouth daily.      . NON FORMULARY EYE VITAMIN  1 TAB PO QD      . Omega-3 Fatty Acids (FISH OIL PO) Take 4 tablets by mouth daily.      . OXcarbazepine (TRILEPTAL) 150 MG tablet Take 1 tablet (150 mg total) by mouth 2 (two) times daily.  60 tablet   1  . sertraline (ZOLOFT) 100 MG tablet 1 1/2  Tab po qd      . simvastatin (ZOCOR) 40 MG tablet Take 40 mg by mouth every evening.      . SUMAtriptan (IMITREX) 100 MG tablet Take 100 mg by mouth every 2 (two) hours as needed.      . zolpidem (AMBIEN) 10 MG tablet Take 10 mg by mouth at bedtime as needed.       No current facility-administered medications for this visit.     Past Medical History  Diagnosis Date  . Migraines   . Hyperlipidemia   . Depression   . Hypothyroid   . Renal insufficiency   . Orthostatic hypotension     Past Surgical History  Procedure Laterality Date  . Joint replacement    . Abdominal hysterectomy    . Thyroid surgery    . Cholecystectomy    . Tonsillectomy      History   Social History  . Marital Status: Married    Spouse Name: N/A    Number of Children: 2  . Years of Education: N/A   Occupational History  .     Social History Main Topics  . Smoking status: Never Smoker   . Smokeless tobacco: Never Used  . Alcohol Use: No  . Drug Use: No  . Sexually Active:  Other Topics Concern  . Not on file   Social History Narrative  . No narrative on file    ROS: no fevers or chills, productive cough, hemoptysis, dysphasia, odynophagia, melena, hematochezia, dysuria, hematuria, rash, seizure activity, orthopnea, PND, pedal edema, claudication. Remaining systems are negative.  Physical Exam: Well-developed well-nourished in no acute distress.  Skin is warm and dry.  HEENT is normal.  Neck is supple.  Chest is clear to auscultation with normal expansion.  Cardiovascular exam is regular rate and rhythm.  Abdominal exam nontender or distended. No masses palpated. Extremities show no edema. neuro grossly intact  ECG sinus rhythm at a rate of 72. RV conduction delay.

## 2012-09-20 ENCOUNTER — Encounter: Payer: Self-pay | Admitting: Neurology

## 2012-09-20 DIAGNOSIS — M19019 Primary osteoarthritis, unspecified shoulder: Secondary | ICD-10-CM

## 2012-09-20 DIAGNOSIS — M79609 Pain in unspecified limb: Secondary | ICD-10-CM

## 2012-09-20 DIAGNOSIS — F29 Unspecified psychosis not due to a substance or known physiological condition: Secondary | ICD-10-CM

## 2012-09-20 DIAGNOSIS — M47812 Spondylosis without myelopathy or radiculopathy, cervical region: Secondary | ICD-10-CM

## 2012-09-20 DIAGNOSIS — Z5181 Encounter for therapeutic drug level monitoring: Secondary | ICD-10-CM

## 2012-09-21 ENCOUNTER — Ambulatory Visit: Payer: Self-pay | Admitting: Neurology

## 2012-10-02 ENCOUNTER — Ambulatory Visit (INDEPENDENT_AMBULATORY_CARE_PROVIDER_SITE_OTHER): Payer: Medicare Other | Admitting: Neurology

## 2012-10-02 ENCOUNTER — Encounter: Payer: Self-pay | Admitting: Neurology

## 2012-10-02 VITALS — BP 133/75 | HR 78 | Wt 218.0 lb

## 2012-10-02 DIAGNOSIS — R51 Headache: Secondary | ICD-10-CM

## 2012-10-02 DIAGNOSIS — M47812 Spondylosis without myelopathy or radiculopathy, cervical region: Secondary | ICD-10-CM

## 2012-10-02 MED ORDER — MELOXICAM 15 MG PO TABS: 15.0000 mg | ORAL_TABLET | Freq: Every day | ORAL | Status: DC

## 2012-10-02 NOTE — Progress Notes (Signed)
Reason for visit: Headache  Mallory Durham is an 72 y.o. female  History of present illness:  Mallory Durham is a 72 year old white female with a history of migraine headache and cervical spondylosis. She has been having symptoms consistent with a left occipital neuralgia and left neck stiffness over the last several weeks. She gained benefit with prednisone, and she is on trileptal. She is having drowsiness and no definite benefit with the Trileptal. She may be getting more headaches on this medication, one or two a week. The patient does not wish to undergo PT for the neck. She has flexeril, but she has not been taking the medication.   Past Medical History  Diagnosis Date  . Migraines   . Hyperlipidemia   . Depression   . Hypothyroid   . Renal insufficiency   . Orthostatic hypotension   . Anxiety   . Neck pain   . Left shoulder pain   . Hx of vertigo   . Confusion, hx of, without neuro findings     Past Surgical History  Procedure Laterality Date  . Joint replacement    . Abdominal hysterectomy    . Thyroid surgery    . Cholecystectomy    . Tonsillectomy    . Abdominal hysterectomy    . Breast lumpectomy      Family History  Problem Relation Age of Onset  . Heart disease Mother     Angina  . Cancer Mother   . Cancer Father   . Heart disease Father   . Cancer Maternal Aunt     Social history:  reports that she has never smoked. She has never used smokeless tobacco. She reports that she does not drink alcohol or use illicit drugs.  Allergies: No Known Allergies  Medications:  Current Outpatient Prescriptions on File Prior to Visit  Medication Sig Dispense Refill  . buPROPion (WELLBUTRIN XL) 150 MG 24 hr tablet Take 150 mg by mouth daily.      . divalproex (DEPAKOTE) 500 MG DR tablet Take 500 mg by mouth daily.      Marland Kitchen gabapentin (NEURONTIN) 300 MG capsule Take 600 mg by mouth 2 (two) times daily.      Marland Kitchen levothyroxine (SYNTHROID, LEVOTHROID) 150 MCG tablet Take  150 mcg by mouth daily.      . NON FORMULARY EYE VITAMIN  1 TAB PO QD      . Omega-3 Fatty Acids (FISH OIL PO) Take 4 tablets by mouth daily.      . sertraline (ZOLOFT) 100 MG tablet 1 1/2  Tab po qd      . SUMAtriptan (IMITREX) 100 MG tablet Take 100 mg by mouth every 2 (two) hours as needed.      . zolpidem (AMBIEN) 10 MG tablet Take 10 mg by mouth at bedtime as needed.       No current facility-administered medications on file prior to visit.    ROS:  Out of a complete 14 system review of symptoms, the patient complains only of the following symptoms, and all other reviewed systems are negative.  Hearing loss Vertigo Snoring Easy bruising Runny nose  Blood pressure 133/75, pulse 78, weight 218 lb (98.884 kg).  Physical Exam  General: The patient is alert and cooperative at the time of the examination.  Neuromuscular: The patient lacks 30 degrees of lateral rotation of the neck to the left, 10 degrees to the right.  Skin: No significant peripheral edema is noted.   Neurologic  Exam  Cranial nerves: Facial symmetry is present. Speech is normal, no aphasia or dysarthria is noted. Extraocular movements are full. Visual fields are full.  Motor: The patient has good strength in all 4 extremities.  Coordination: The patient has good finger-nose-finger and heel-to-shin bilaterally.  Gait and station: The patient has a normal gait. Tandem gait is normal. Romberg is negative. No drift is seen.  Reflexes: Deep tendon reflexes are symmetric.   Assessment/Plan:  1. Migraine headache  2. Left occipital neuralgia  3. Cervical spondylosis  The patient is having ongoing left neck pain, but she does not wish to have physical therapy. I will place her on Mobic, and she will come off of the Trileptal given the side effects. She will follow up in 6 months.  Marlan Palau MD 10/02/2012 1:13 PM  [facility] [address] [place], [place]  [address]  Phone [phone] Fax [phone]

## 2012-10-03 ENCOUNTER — Telehealth: Payer: Self-pay | Admitting: *Deleted

## 2012-10-03 DIAGNOSIS — S139XXD Sprain of joints and ligaments of unspecified parts of neck, subsequent encounter: Secondary | ICD-10-CM

## 2012-10-03 NOTE — Telephone Encounter (Signed)
I called the patient. She indicates that she is followed by [facility], and she was told not to go on NSAIDS due to mild CRI. BUN/CR from 8/13 was 10/0.9. We will not use MOBIC, and go with PT of the cervical spine.

## 2012-10-03 NOTE — Telephone Encounter (Signed)
Patient called stating she can't Mobic because her kidney. Patient would like PT.

## 2012-10-16 ENCOUNTER — Telehealth: Payer: Self-pay | Admitting: Internal Medicine

## 2012-10-16 MED ORDER — DOXYCYCLINE HYCLATE 100 MG PO TABS: ORAL_TABLET | ORAL | Status: DC

## 2012-10-16 NOTE — Telephone Encounter (Signed)
Spoke with patient, Patient aware Rx has been sent to verified pharmacy. Nothing further needed at this time.

## 2012-10-16 NOTE — Telephone Encounter (Signed)
Per Cy-okay to give Doxycycline 100 mg #8 take 2 today then 1 daily no refills.

## 2012-10-16 NOTE — Telephone Encounter (Signed)
Last OV 03-17-2012, next OV 1 year. Pt c/o having sore throat, runny nose with clear mucus, productive cough with pale yellow phlegm x 1 week. Pt denies any wheezing, sinus congestion, Sob at this time. Please advise. Carron Curie, CMA No Known Allergies

## 2012-10-31 ENCOUNTER — Telehealth: Payer: Self-pay | Admitting: Internal Medicine

## 2012-10-31 MED ORDER — DOXYCYCLINE HYCLATE 100 MG PO TABS: ORAL_TABLET | ORAL | Status: DC

## 2012-10-31 NOTE — Telephone Encounter (Signed)
Doxycycline 100 mg #8 take 2 today then 1 daily no refills.  Sent to [facility] in [place]  Pt aware.

## 2012-10-31 NOTE — Telephone Encounter (Signed)
Pt c/o increased chest congestion, prod cough with yellow mucus, PND since completing round of abx. Pt denies sinus pressure, HA, Fever and sore throat.   Pt given abx 10/16/12 for Doxycycline 100 mg #8 take 2 today then 1 daily no refills.  Completed abx and symptoms improved and are now returning. Pt requesting another round of abx.  [facility] [place]  No Known Allergies  Please advise recs Dr Maple Hudson. Thanks.

## 2012-10-31 NOTE — Telephone Encounter (Signed)
Per CY-okay to refill Doxycycline Rx.

## 2013-03-20 ENCOUNTER — Ambulatory Visit: Payer: Medicare Other | Admitting: Internal Medicine

## 2013-04-05 ENCOUNTER — Ambulatory Visit (INDEPENDENT_AMBULATORY_CARE_PROVIDER_SITE_OTHER): Payer: Medicare Other | Admitting: Neurology

## 2013-04-05 ENCOUNTER — Encounter (INDEPENDENT_AMBULATORY_CARE_PROVIDER_SITE_OTHER): Payer: Self-pay

## 2013-04-05 ENCOUNTER — Encounter: Payer: Self-pay | Admitting: Neurology

## 2013-04-05 VITALS — BP 115/74 | HR 71 | Ht 67.0 in | Wt 230.0 lb

## 2013-04-05 DIAGNOSIS — M47812 Spondylosis without myelopathy or radiculopathy, cervical region: Secondary | ICD-10-CM

## 2013-04-05 MED ORDER — HYDROCODONE-ACETAMINOPHEN 5-325 MG PO TABS: 1.0000 | ORAL_TABLET | Freq: Four times a day (QID) | ORAL | Status: DC | PRN

## 2013-04-05 NOTE — Progress Notes (Signed)
Reason for visit: Neck pain  Mallory Durham is an 72 y.o. female  History of present illness:  Mallory Durham is a 72 year old right-handed white female with a history of cervical spondylosis associated with cervicogenic headaches on occasion. Since last seen, the patient was referred to physical therapy for neuromuscular therapy, but this apparently never occurred. The patient has been placed on Mobic, but it is felt that she has mild chronic renal insufficiency, and she never took the medication. The patient indicates that she no longer is having cervicogenic headaches, but she does have some discomfort in the neck when she turns her head. The patient mainly has left sided neck pain, sometimes on the right. The patient denies pain down arms, but she does have some intermittent numbness involving the fourth and fifth fingers of the left hand. The patient denies any weakness of the extremities. The patient comes to this office for an evaluation. The patient remains on gabapentin taking 600 mg twice daily, and she might take Flexeril 10 mg at night on occasion.  Past Medical History  Diagnosis Date  . Migraines   . Hyperlipidemia   . Depression   . Hypothyroid   . Renal insufficiency   . Orthostatic hypotension   . Anxiety   . Neck pain   . Left shoulder pain   . Hx of vertigo   . Confusion, hx of, without neuro findings     Past Surgical History  Procedure Laterality Date  . Joint replacement    . Abdominal hysterectomy    . Thyroid surgery    . Cholecystectomy    . Tonsillectomy    . Abdominal hysterectomy    . Breast lumpectomy      Family History  Problem Relation Age of Onset  . Heart disease Mother     Angina  . Cancer Mother   . Cancer Father   . Heart disease Father   . Cancer Maternal Aunt     Social history:  reports that she has never smoked. She has never used smokeless tobacco. She reports that she does not drink alcohol or use illicit drugs.   No Known  Allergies  Medications:  Current Outpatient Prescriptions on File Prior to Visit  Medication Sig Dispense Refill  . buPROPion (WELLBUTRIN XL) 150 MG 24 hr tablet Take 150 mg by mouth daily.      . cyclobenzaprine (FLEXERIL) 10 MG tablet Take 10 mg by mouth as needed.      . divalproex (DEPAKOTE) 500 MG DR tablet Take 500 mg by mouth daily.      Marland Kitchen gabapentin (NEURONTIN) 300 MG capsule Take 600 mg by mouth 2 (two) times daily.      Marland Kitchen levothyroxine (SYNTHROID, LEVOTHROID) 150 MCG tablet Take 150 mcg by mouth daily.      . NON FORMULARY EYE VITAMIN  1 TAB PO QD      . Omega-3 Fatty Acids (FISH OIL PO) Take 4 tablets by mouth daily.      . sertraline (ZOLOFT) 100 MG tablet 1 1/2  Tab po qd      . simvastatin (ZOCOR) 20 MG tablet Take 20 mg by mouth every evening.      . SUMAtriptan (IMITREX) 100 MG tablet Take 100 mg by mouth every 2 (two) hours as needed.      . zolpidem (AMBIEN) 10 MG tablet Take 10 mg by mouth at bedtime as needed.       No current facility-administered medications on  file prior to visit.    ROS:  Out of a complete 14 system review of symptoms, the patient complains only of the following symptoms, and all other reviewed systems are negative.  Hearing loss Snoring Easy bruising Runny nose Memory loss, headache, numbness Depression  Blood pressure 115/74, pulse 71, height 5\' 7"  (1.702 m), weight 230 lb (104.327 kg).  Physical Exam  General: The patient is alert and cooperative at the time of the examination.  Neuromuscular: The patient lacks about 40 of lateral rotation of the cervical spine bilaterally.  Skin: No significant peripheral edema is noted.   Neurologic Exam  Mental status: The patient is oriented x 3.  Cranial nerves: Facial symmetry is present. Speech is normal, no aphasia or dysarthria is noted. Extraocular movements are full. Visual fields are full.  Motor: The patient has good strength in all 4 extremities.  Sensory examination: Soft  touch sensation on the face, arms, and legs is symmetric.  Coordination: The patient has good finger-nose-finger and heel-to-shin bilaterally.  Gait and station: The patient has a normal gait. Tandem gait is normal. Romberg is negative. No drift is seen.  Reflexes: Deep tendon reflexes are symmetric.   Assessment/Plan:  1. Cervical spondylosis  2. Cervicogenic headache  The patient is doing a bit better with the headaches at this point. The patient has never had MRI evaluation of the cervical spine, but she has had plain x-ray that shows multilevel degenerative disc disease and spondylosis. The patient has limitation of range of motion of the neck. The patient will be set up again for neuromuscular therapy on the neck and shoulders. The patient followup in 6 months. If the pain worsens, epidural steroid injections may be considered.  Marlan Palau MD 04/05/2013 7:35 PM  [facility] [address] [place], [place] [address]  Phone [phone] Fax [phone]

## 2013-04-17 LAB — HM MAMMOGRAPHY: HM MAMMO: NORMAL

## 2013-04-30 ENCOUNTER — Ambulatory Visit: Payer: Medicare Other | Admitting: Internal Medicine

## 2013-05-07 ENCOUNTER — Ambulatory Visit (INDEPENDENT_AMBULATORY_CARE_PROVIDER_SITE_OTHER): Payer: Medicare Other | Admitting: Internal Medicine

## 2013-05-07 ENCOUNTER — Encounter: Payer: Self-pay | Admitting: Internal Medicine

## 2013-05-07 VITALS — BP 120/76 | HR 79 | Ht 67.0 in | Wt 225.0 lb

## 2013-05-07 DIAGNOSIS — J42 Unspecified chronic bronchitis: Secondary | ICD-10-CM

## 2013-05-07 DIAGNOSIS — J309 Allergic rhinitis, unspecified: Secondary | ICD-10-CM

## 2013-05-07 MED ORDER — IPRATROPIUM BROMIDE 0.03 % NA SOLN: NASAL | Status: DC

## 2013-05-07 NOTE — Progress Notes (Signed)
03/17/12- 71 yoF never smoker followed for rhinitis, bronchitis  Husband here LOV- 04/30/09  has had flu vaccine. Had pneumonia vaccine 2011. PCP Dr Clide Cliff [place]/ [place] Acute visit-for 2 weeks has had sore throat progressing to chest congestion sinus congestion. Had pneumonia treated as an outpatient in spring of 2013 so she is concerned. 1 week ago her primary physician gave Z-Pak and albuterol which are now finished. She felt worse 3 days ago but today complains of wheeze, scant phlegm with dry cough and no fever or sweat. Frontal headache with pressure has resolved. She did take Sudafed  05/07/13- 72 yoF never smoker followed for rhinitis, bronchitis   FOLLOWS FOR:  Having cough some days and runny nose x2 months Cough during fall season. Runny nose since spring. Aggravated by 18 or moving her jaw. No wheeze. Coughs with reflux. Scant clear mucus. CXR 03/20/12 IMPRESSION:  Linear right middle lobe opacity may represent atelectasis or  scarring. Otherwise, no acute cardiopulmonary disease.  Original Report Authenticated By: Malachy Moan, M.D  ROS-see HPI Constitutional:   No-   weight loss, night sweats, fevers, chills, fatigue, lassitude. HEENT:   No-  headaches, difficulty swallowing, tooth/dental problems, sore throat,       No-  sneezing, itching, ear ache, +nasal congestion, post nasal drip,  CV:  No-   chest pain, orthopnea, PND, swelling in lower extremities, anasarca, dizziness, palpitations Resp: + shortness of breath with exertion or at rest.            + productive cough,  + non-productive cough,  No- coughing up of blood.              No-   change in color of mucus.  No- wheezing.   Skin: No-   rash or lesions. GI:  + heartburn, indigestion, no-abdominal pain, nausea, vomiting, GU:  MS:  No-   joint pain or swelling.   Neuro-     nothing unusual Psych:  No- change in mood or affect. No depression or anxiety.  No memory loss.  OBJ- Physical Exam General- Alert,  Oriented, Affect-appropriate, Distress- none acute.  Skin- rash-none, lesions- none, excoriation- none Lymphadenopathy- none Head- atraumatic            Eyes- Gross vision intact, PERRLA, conjunctivae and secretions clear            Ears- Hearing, canals-normal            Nose- Clear, no-Septal dev, mucus, polyps, erosion, perforation             Throat- Mallampati II-III , mucosa clear , drainage- none, tonsils- atrophic Neck- flexible , trachea midline, no stridor , thyroid+scar, carotid no bruit Chest - symmetrical excursion , unlabored           Heart/CV- RRR , no murmur , no gallop  , no rub, nl s1 s2                           - JVD- none , edema- none, stasis changes- none, varices- none           Lung- clear to P&A, wheeze-none,  Cough-none , dullness-none, rub- none           Chest wall-  Abd-  Br/ Gen/ Rectal- Not done, not indicated Extrem- cyanosis- none, clubbing, none, atrophy- none, strength- nl Neuro- grossly intact to observation

## 2013-05-07 NOTE — Patient Instructions (Addendum)
Sample Spiriva inhaler   Inhale 1, once daily    Watch to see if this helps with the chronic cough  Try to minimize reflux from stomach. Avoid mints. Don't lie down for 2 hours after eating.  Script for ipratropium nasal spray to use as needed for watery nose. Try using before meals.

## 2013-05-14 ENCOUNTER — Telehealth: Payer: Self-pay | Admitting: Internal Medicine

## 2013-05-14 MED ORDER — DOXYCYCLINE HYCLATE 100 MG PO TABS: ORAL_TABLET | ORAL | Status: DC

## 2013-05-14 NOTE — Telephone Encounter (Signed)
Per CY-give patient Doxycycline 100 mg #8 take 2 today then 1 daily until gone no refills; and if she feels she needs a cough syrup she can have Phenergan with codeine syrup #180 ml 1 tsp every 6 hours prn cough no refills. Thanks.

## 2013-05-14 NOTE — Telephone Encounter (Signed)
Pt aware of recs. She will hold off on the cough syrup for now. Nothing further eeded

## 2013-05-14 NOTE — Telephone Encounter (Signed)
I called spoke with pt. She reports after she started the spiriva she noticed her cough was worse and deeper. She has now developed a HA and coughs up pale yellow phlem. Please advise Dr. Maple Hudson any recs. Thanks  No Known Allergies   Current Outpatient Prescriptions on File Prior to Visit  Medication Sig Dispense Refill  . buPROPion (WELLBUTRIN XL) 150 MG 24 hr tablet Take 150 mg by mouth daily.      . celecoxib (CELEBREX) 200 MG capsule Take 200 mg by mouth 2 (two) times daily.      . divalproex (DEPAKOTE) 500 MG DR tablet Take 500 mg by mouth daily.      Marland Kitchen gabapentin (NEURONTIN) 300 MG capsule Take 600 mg by mouth 2 (two) times daily.      Marland Kitchen HYDROcodone-acetaminophen (NORCO/VICODIN) 5-325 MG per tablet Take 1 tablet by mouth every 6 (six) hours as needed for moderate pain.  30 tablet  0  . ipratropium (ATROVENT) 0.03 % nasal spray 1-2 puffs each nostril up to 3 times daily as needed  30 mL  12  . levothyroxine (SYNTHROID, LEVOTHROID) 150 MCG tablet Take 150 mcg by mouth daily.      . NON FORMULARY EYE VITAMIN  1 TAB PO QD      . Omega-3 Fatty Acids (FISH OIL PO) Take 4 tablets by mouth daily.      . sertraline (ZOLOFT) 100 MG tablet 1 1/2  Tab po qd      . simvastatin (ZOCOR) 20 MG tablet Take 20 mg by mouth every evening.      . SUMAtriptan (IMITREX) 100 MG tablet Take 100 mg by mouth every 2 (two) hours as needed.      . zolpidem (AMBIEN) 10 MG tablet Take 10 mg by mouth at bedtime as needed.       No current facility-administered medications on file prior to visit.

## 2013-06-02 NOTE — Assessment & Plan Note (Addendum)
Tracheobronchitis probably aggravated by postnasal drip and low-grade reflux Plan-education done. Sample Spiriva for trial

## 2013-06-02 NOTE — Assessment & Plan Note (Signed)
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 Plan-ipratropium nasal spray 0.03%

## 2013-06-04 DIAGNOSIS — M7730 Calcaneal spur, unspecified foot: Secondary | ICD-10-CM | POA: Diagnosis not present

## 2013-06-04 DIAGNOSIS — M7660 Achilles tendinitis, unspecified leg: Secondary | ICD-10-CM | POA: Diagnosis not present

## 2013-06-13 DIAGNOSIS — Z1231 Encounter for screening mammogram for malignant neoplasm of breast: Secondary | ICD-10-CM | POA: Diagnosis not present

## 2013-06-20 DIAGNOSIS — R928 Other abnormal and inconclusive findings on diagnostic imaging of breast: Secondary | ICD-10-CM | POA: Diagnosis not present

## 2013-07-18 DIAGNOSIS — IMO0002 Reserved for concepts with insufficient information to code with codable children: Secondary | ICD-10-CM | POA: Diagnosis not present

## 2013-07-18 DIAGNOSIS — M7730 Calcaneal spur, unspecified foot: Secondary | ICD-10-CM | POA: Diagnosis not present

## 2013-08-24 ENCOUNTER — Telehealth: Payer: Self-pay | Admitting: Internal Medicine

## 2013-08-24 MED ORDER — DOXYCYCLINE HYCLATE 100 MG PO TABS: ORAL_TABLET | ORAL | Status: DC

## 2013-08-24 NOTE — Telephone Encounter (Signed)
Spoke with pt. C/o chest congestion, prod cough (yellow), sinus drainage (clear), sorethroat, some wheezing, SOB about the same.  Denies fever. [facility] [place].  Please advise  No Known Allergies  Current Outpatient Prescriptions on File Prior to Visit  Medication Sig Dispense Refill  . buPROPion (WELLBUTRIN XL) 150 MG 24 hr tablet Take 150 mg by mouth daily.      . celecoxib (CELEBREX) 200 MG capsule Take 200 mg by mouth 2 (two) times daily.      . divalproex (DEPAKOTE) 500 MG DR tablet Take 500 mg by mouth daily.      Marland Kitchen. doxycycline (VIBRA-TABS) 100 MG tablet Take 2 today then 1 daily until gone  8 tablet  0  . gabapentin (NEURONTIN) 300 MG capsule Take 600 mg by mouth 2 (two) times daily.      Marland Kitchen. HYDROcodone-acetaminophen (NORCO/VICODIN) 5-325 MG per tablet Take 1 tablet by mouth every 6 (six) hours as needed for moderate pain.  30 tablet  0  . ipratropium (ATROVENT) 0.03 % nasal spray 1-2 puffs each nostril up to 3 times daily as needed  30 mL  12  . levothyroxine (SYNTHROID, LEVOTHROID) 150 MCG tablet Take 150 mcg by mouth daily.      . NON FORMULARY EYE VITAMIN  1 TAB PO QD      . Omega-3 Fatty Acids (FISH OIL PO) Take 4 tablets by mouth daily.      . sertraline (ZOLOFT) 100 MG tablet 1 1/2  Tab po qd      . simvastatin (ZOCOR) 20 MG tablet Take 20 mg by mouth every evening.      . SUMAtriptan (IMITREX) 100 MG tablet Take 100 mg by mouth every 2 (two) hours as needed.      . zolpidem (AMBIEN) 10 MG tablet Take 10 mg by mouth at bedtime as needed.       No current facility-administered medications on file prior to visit.

## 2013-08-24 NOTE — Telephone Encounter (Signed)
Offer refill doxycycline 100 mg, # 8, 2 today then one daily  Also suggest Allegra-D from pharmacist counter- 1 each morning while needed

## 2013-08-24 NOTE — Telephone Encounter (Signed)
Pt aware of recs. RX sent. Nothing further needed 

## 2013-09-19 DIAGNOSIS — M7730 Calcaneal spur, unspecified foot: Secondary | ICD-10-CM | POA: Diagnosis not present

## 2013-09-19 DIAGNOSIS — IMO0002 Reserved for concepts with insufficient information to code with codable children: Secondary | ICD-10-CM | POA: Diagnosis not present

## 2013-09-25 DIAGNOSIS — M25579 Pain in unspecified ankle and joints of unspecified foot: Secondary | ICD-10-CM | POA: Diagnosis not present

## 2013-10-01 ENCOUNTER — Telehealth: Payer: Self-pay | Admitting: Nurse Practitioner

## 2013-10-01 ENCOUNTER — Ambulatory Visit: Payer: Medicare Other | Admitting: Nurse Practitioner

## 2013-10-01 NOTE — Telephone Encounter (Signed)
Patient's husband called to state that his wife is too sick to make her appointment this afternoon with stomach upset and vomiting.

## 2013-10-03 DIAGNOSIS — R946 Abnormal results of thyroid function studies: Secondary | ICD-10-CM | POA: Diagnosis not present

## 2013-10-03 DIAGNOSIS — M6790 Unspecified disorder of synovium and tendon, unspecified site: Secondary | ICD-10-CM | POA: Diagnosis not present

## 2013-10-03 DIAGNOSIS — N181 Chronic kidney disease, stage 1: Secondary | ICD-10-CM | POA: Diagnosis not present

## 2013-10-03 DIAGNOSIS — M719 Bursopathy, unspecified: Secondary | ICD-10-CM | POA: Diagnosis not present

## 2013-10-03 DIAGNOSIS — E038 Other specified hypothyroidism: Secondary | ICD-10-CM | POA: Diagnosis not present

## 2013-10-03 DIAGNOSIS — E785 Hyperlipidemia, unspecified: Secondary | ICD-10-CM | POA: Diagnosis not present

## 2013-10-03 DIAGNOSIS — E0789 Other specified disorders of thyroid: Secondary | ICD-10-CM | POA: Diagnosis not present

## 2013-10-03 LAB — LIPID PANEL
CHOLESTEROL: 151 mg/dL (ref 0–200)
HDL: 61 mg/dL (ref 35–70)
LDL CALC: 70 mg/dL
TRIGLYCERIDES: 100 mg/dL (ref 40–160)

## 2013-10-04 DIAGNOSIS — H353190 Nonexudative age-related macular degeneration, unspecified eye, stage unspecified: Secondary | ICD-10-CM | POA: Diagnosis not present

## 2013-10-04 DIAGNOSIS — H43819 Vitreous degeneration, unspecified eye: Secondary | ICD-10-CM | POA: Diagnosis not present

## 2013-10-04 DIAGNOSIS — Z961 Presence of intraocular lens: Secondary | ICD-10-CM | POA: Diagnosis not present

## 2013-10-04 DIAGNOSIS — H40019 Open angle with borderline findings, low risk, unspecified eye: Secondary | ICD-10-CM | POA: Diagnosis not present

## 2013-10-09 ENCOUNTER — Other Ambulatory Visit: Payer: Self-pay

## 2013-10-16 ENCOUNTER — Ambulatory Visit: Payer: Medicare Other | Admitting: Nurse Practitioner

## 2013-11-07 DIAGNOSIS — M79609 Pain in unspecified limb: Secondary | ICD-10-CM | POA: Diagnosis not present

## 2013-11-15 DIAGNOSIS — M7660 Achilles tendinitis, unspecified leg: Secondary | ICD-10-CM | POA: Diagnosis not present

## 2013-11-15 DIAGNOSIS — Z4789 Encounter for other orthopedic aftercare: Secondary | ICD-10-CM | POA: Diagnosis not present

## 2013-11-15 DIAGNOSIS — E785 Hyperlipidemia, unspecified: Secondary | ICD-10-CM | POA: Diagnosis not present

## 2013-11-27 DIAGNOSIS — Z9889 Other specified postprocedural states: Secondary | ICD-10-CM | POA: Diagnosis not present

## 2013-11-27 DIAGNOSIS — M7660 Achilles tendinitis, unspecified leg: Secondary | ICD-10-CM | POA: Diagnosis not present

## 2013-12-05 DIAGNOSIS — Z9889 Other specified postprocedural states: Secondary | ICD-10-CM | POA: Diagnosis not present

## 2013-12-05 DIAGNOSIS — M7660 Achilles tendinitis, unspecified leg: Secondary | ICD-10-CM | POA: Diagnosis not present

## 2013-12-12 DIAGNOSIS — G890 Central pain syndrome: Secondary | ICD-10-CM | POA: Diagnosis not present

## 2013-12-18 DIAGNOSIS — Z9889 Other specified postprocedural states: Secondary | ICD-10-CM | POA: Diagnosis not present

## 2013-12-18 DIAGNOSIS — Z5189 Encounter for other specified aftercare: Secondary | ICD-10-CM | POA: Diagnosis not present

## 2014-01-14 DIAGNOSIS — R6889 Other general symptoms and signs: Secondary | ICD-10-CM | POA: Diagnosis not present

## 2014-01-14 DIAGNOSIS — M7660 Achilles tendinitis, unspecified leg: Secondary | ICD-10-CM | POA: Diagnosis not present

## 2014-01-14 DIAGNOSIS — M25676 Stiffness of unspecified foot, not elsewhere classified: Secondary | ICD-10-CM | POA: Diagnosis not present

## 2014-01-14 DIAGNOSIS — M25673 Stiffness of unspecified ankle, not elsewhere classified: Secondary | ICD-10-CM | POA: Diagnosis not present

## 2014-01-14 DIAGNOSIS — M6281 Muscle weakness (generalized): Secondary | ICD-10-CM | POA: Diagnosis not present

## 2014-01-23 DIAGNOSIS — R6889 Other general symptoms and signs: Secondary | ICD-10-CM | POA: Diagnosis not present

## 2014-01-23 DIAGNOSIS — M25676 Stiffness of unspecified foot, not elsewhere classified: Secondary | ICD-10-CM | POA: Diagnosis not present

## 2014-01-23 DIAGNOSIS — M7660 Achilles tendinitis, unspecified leg: Secondary | ICD-10-CM | POA: Diagnosis not present

## 2014-01-23 DIAGNOSIS — M25673 Stiffness of unspecified ankle, not elsewhere classified: Secondary | ICD-10-CM | POA: Diagnosis not present

## 2014-01-23 DIAGNOSIS — M6281 Muscle weakness (generalized): Secondary | ICD-10-CM | POA: Diagnosis not present

## 2014-01-30 DIAGNOSIS — Z23 Encounter for immunization: Secondary | ICD-10-CM | POA: Diagnosis not present

## 2014-01-31 DIAGNOSIS — M6281 Muscle weakness (generalized): Secondary | ICD-10-CM | POA: Diagnosis not present

## 2014-01-31 DIAGNOSIS — M25676 Stiffness of unspecified foot, not elsewhere classified: Secondary | ICD-10-CM | POA: Diagnosis not present

## 2014-01-31 DIAGNOSIS — M25673 Stiffness of unspecified ankle, not elsewhere classified: Secondary | ICD-10-CM | POA: Diagnosis not present

## 2014-02-04 DIAGNOSIS — M79609 Pain in unspecified limb: Secondary | ICD-10-CM | POA: Diagnosis not present

## 2014-02-04 DIAGNOSIS — Z9889 Other specified postprocedural states: Secondary | ICD-10-CM | POA: Diagnosis not present

## 2014-02-18 DIAGNOSIS — Z23 Encounter for immunization: Secondary | ICD-10-CM | POA: Diagnosis not present

## 2014-02-20 DIAGNOSIS — M79672 Pain in left foot: Secondary | ICD-10-CM | POA: Diagnosis not present

## 2014-02-20 DIAGNOSIS — Z9889 Other specified postprocedural states: Secondary | ICD-10-CM | POA: Diagnosis not present

## 2014-03-05 DIAGNOSIS — H40011 Open angle with borderline findings, low risk, right eye: Secondary | ICD-10-CM | POA: Diagnosis not present

## 2014-03-06 DIAGNOSIS — S92352S Displaced fracture of fifth metatarsal bone, left foot, sequela: Secondary | ICD-10-CM | POA: Diagnosis not present

## 2014-03-29 DIAGNOSIS — M79672 Pain in left foot: Secondary | ICD-10-CM | POA: Diagnosis not present

## 2014-04-05 DIAGNOSIS — S39012A Strain of muscle, fascia and tendon of lower back, initial encounter: Secondary | ICD-10-CM | POA: Diagnosis not present

## 2014-04-05 DIAGNOSIS — R8290 Unspecified abnormal findings in urine: Secondary | ICD-10-CM | POA: Diagnosis not present

## 2014-04-05 DIAGNOSIS — Z0000 Encounter for general adult medical examination without abnormal findings: Secondary | ICD-10-CM | POA: Diagnosis not present

## 2014-04-05 DIAGNOSIS — R0602 Shortness of breath: Secondary | ICD-10-CM | POA: Diagnosis not present

## 2014-04-05 DIAGNOSIS — R5383 Other fatigue: Secondary | ICD-10-CM | POA: Diagnosis not present

## 2014-04-05 DIAGNOSIS — E039 Hypothyroidism, unspecified: Secondary | ICD-10-CM | POA: Diagnosis not present

## 2014-04-05 DIAGNOSIS — E785 Hyperlipidemia, unspecified: Secondary | ICD-10-CM | POA: Diagnosis not present

## 2014-04-19 DIAGNOSIS — S92352S Displaced fracture of fifth metatarsal bone, left foot, sequela: Secondary | ICD-10-CM | POA: Diagnosis not present

## 2014-04-26 DIAGNOSIS — M1611 Unilateral primary osteoarthritis, right hip: Secondary | ICD-10-CM | POA: Diagnosis not present

## 2014-04-26 DIAGNOSIS — M25551 Pain in right hip: Secondary | ICD-10-CM | POA: Diagnosis not present

## 2014-05-06 DIAGNOSIS — M545 Low back pain: Secondary | ICD-10-CM | POA: Diagnosis not present

## 2014-05-06 DIAGNOSIS — M25551 Pain in right hip: Secondary | ICD-10-CM | POA: Diagnosis not present

## 2014-05-07 ENCOUNTER — Ambulatory Visit (INDEPENDENT_AMBULATORY_CARE_PROVIDER_SITE_OTHER): Payer: Medicare Other | Admitting: Internal Medicine

## 2014-05-07 ENCOUNTER — Encounter: Payer: Self-pay | Admitting: Internal Medicine

## 2014-05-07 VITALS — BP 124/78 | HR 66 | Ht 67.0 in | Wt 223.4 lb

## 2014-05-07 DIAGNOSIS — J410 Simple chronic bronchitis: Secondary | ICD-10-CM | POA: Diagnosis not present

## 2014-05-07 DIAGNOSIS — J309 Allergic rhinitis, unspecified: Secondary | ICD-10-CM | POA: Diagnosis not present

## 2014-05-07 DIAGNOSIS — J302 Other seasonal allergic rhinitis: Secondary | ICD-10-CM

## 2014-05-07 DIAGNOSIS — J3089 Other allergic rhinitis: Principal | ICD-10-CM

## 2014-05-07 NOTE — Assessment & Plan Note (Signed)
Better control with fewer acute exacerbations. Not clear how much of this is due to medication and how much due to weather and other factors. Plan-as long as she remains stable, we can see her back as needed and she can follow with her primary physician for routine refills.

## 2014-05-07 NOTE — Patient Instructions (Signed)
Good luck with everything ! We will be happy to see you again if we can help.

## 2014-05-07 NOTE — Assessment & Plan Note (Signed)
Antihistamines have been sufficient

## 2014-05-07 NOTE — Progress Notes (Signed)
03/17/12- 71 yoF never smoker followed for rhinitis, bronchitis  Husband here LOV- 04/30/09  has had flu vaccine. Had pneumonia vaccine 2011. PCP Dr Clide Clifficky [place]/ [place] Acute visit-for 2 weeks has had sore throat progressing to chest congestion sinus congestion. Had pneumonia treated as an outpatient in spring of 2013 so she is concerned. 1 week ago her primary physician gave Z-Pak and albuterol which are now finished. She felt worse 3 days ago but today complains of wheeze, scant phlegm with dry cough and no fever or sweat. Frontal headache with pressure has resolved. She did take Sudafed  05/07/13- 72 yoF never smoker followed for rhinitis, bronchitis   FOLLOWS FOR:  Having cough some days and runny nose x2 months Cough during fall season. Runny nose since spring. Aggravated by eating or moving her jaw. No wheeze. Coughs with reflux. Scant clear mucus. CXR 03/20/12 IMPRESSION:  Linear right middle lobe opacity may represent atelectasis or  scarring. Otherwise, no acute cardiopulmonary disease.  Original Report Authenticated By: Malachy MoanHeath McCullough, M.D  05/07/14- 89 yoF never smoker followed for rhinitis, bronchitis   FOLLOWS FOR: Cough at times; denies any wheezing, SOB, or congestion. Less nasal drip. Says her breathing is doing well. Se has had more orthopedic problems this year.  ROS-see HPI Constitutional:   No-   weight loss, night sweats, fevers, chills, fatigue, lassitude. HEENT:   No-  headaches, difficulty swallowing, tooth/dental problems, sore throat,       No-  sneezing, itching, ear ache, +nasal congestion, post nasal drip,  CV:  No-   chest pain, orthopnea, PND, swelling in lower extremities, anasarca, dizziness, palpitations Resp: no-shortness of breath with exertion or at rest.            No- productive cough,  N0- non-productive cough,  No- coughing up of blood.              No-   change in color of mucus.  No- wheezing.   Skin: No-   rash or lesions. GI:  +  heartburn, indigestion, no-abdominal pain, nausea, vomiting, GU:  MS:  + joint pain or swelling.   Neuro-     nothing unusual Psych:  No- change in mood or affect. No depression or anxiety.  No memory loss.  05/07/14-   OBJ- Physical Exam General- Alert, Oriented, Affect-appropriate, Distress- none acute.  Skin- rash-none, lesions- none, excoriation- none Lymphadenopathy- none Head- atraumatic            Eyes- Gross vision intact, PERRLA, conjunctivae and secretions clear            Ears- Hearing, canals-normal            Nose- Clear, no-Septal dev, mucus, polyps, erosion, perforation             Throat- Mallampati II-III , mucosa clear , drainage- none, tonsils- atrophic Neck- flexible , trachea midline, no stridor , thyroid+scar, carotid no bruit Chest - symmetrical excursion , unlabored           Heart/CV- RRR , no murmur , no gallop  , no rub, nl s1 s2                           - JVD- none , edema- none, stasis changes- none, varices- none           Lung- clear to P&A, wheeze-none,  Cough-none , dullness-none, rub- none  Chest wall-  Abd-  Br/ Gen/ Rectal- Not done, not indicated Extrem- cyanosis- none, clubbing, none, atrophy- none, strength- nl Neuro- grossly intact to observation

## 2014-05-14 ENCOUNTER — Ambulatory Visit: Payer: Medicare Other | Admitting: Nurse Practitioner

## 2014-05-16 DIAGNOSIS — M47817 Spondylosis without myelopathy or radiculopathy, lumbosacral region: Secondary | ICD-10-CM | POA: Diagnosis not present

## 2014-05-16 DIAGNOSIS — M545 Low back pain: Secondary | ICD-10-CM | POA: Diagnosis not present

## 2014-05-16 DIAGNOSIS — M47816 Spondylosis without myelopathy or radiculopathy, lumbar region: Secondary | ICD-10-CM | POA: Diagnosis not present

## 2014-05-21 DIAGNOSIS — F5104 Psychophysiologic insomnia: Secondary | ICD-10-CM | POA: Diagnosis not present

## 2014-05-21 DIAGNOSIS — R946 Abnormal results of thyroid function studies: Secondary | ICD-10-CM | POA: Diagnosis not present

## 2014-05-29 DIAGNOSIS — M5136 Other intervertebral disc degeneration, lumbar region: Secondary | ICD-10-CM | POA: Diagnosis not present

## 2014-05-29 DIAGNOSIS — M545 Low back pain: Secondary | ICD-10-CM | POA: Diagnosis not present

## 2014-06-19 DIAGNOSIS — N189 Chronic kidney disease, unspecified: Secondary | ICD-10-CM | POA: Diagnosis not present

## 2014-06-19 DIAGNOSIS — R8290 Unspecified abnormal findings in urine: Secondary | ICD-10-CM | POA: Diagnosis not present

## 2014-06-19 DIAGNOSIS — E039 Hypothyroidism, unspecified: Secondary | ICD-10-CM | POA: Diagnosis not present

## 2014-07-16 DIAGNOSIS — Z1231 Encounter for screening mammogram for malignant neoplasm of breast: Secondary | ICD-10-CM | POA: Diagnosis not present

## 2014-09-16 DIAGNOSIS — Z8669 Personal history of other diseases of the nervous system and sense organs: Secondary | ICD-10-CM | POA: Diagnosis not present

## 2014-09-16 DIAGNOSIS — R110 Nausea: Secondary | ICD-10-CM | POA: Diagnosis not present

## 2014-09-16 DIAGNOSIS — G43019 Migraine without aura, intractable, without status migrainosus: Secondary | ICD-10-CM | POA: Diagnosis not present

## 2014-09-29 DIAGNOSIS — N390 Urinary tract infection, site not specified: Secondary | ICD-10-CM | POA: Diagnosis not present

## 2014-09-29 DIAGNOSIS — R300 Dysuria: Secondary | ICD-10-CM | POA: Diagnosis not present

## 2014-10-07 DIAGNOSIS — R300 Dysuria: Secondary | ICD-10-CM | POA: Diagnosis not present

## 2014-10-07 DIAGNOSIS — S92514A Nondisplaced fracture of proximal phalanx of right lesser toe(s), initial encounter for closed fracture: Secondary | ICD-10-CM | POA: Diagnosis not present

## 2014-10-07 DIAGNOSIS — E039 Hypothyroidism, unspecified: Secondary | ICD-10-CM | POA: Diagnosis not present

## 2014-10-07 DIAGNOSIS — S99921A Unspecified injury of right foot, initial encounter: Secondary | ICD-10-CM | POA: Diagnosis not present

## 2014-10-07 DIAGNOSIS — M545 Low back pain: Secondary | ICD-10-CM | POA: Diagnosis not present

## 2014-10-17 DIAGNOSIS — H35033 Hypertensive retinopathy, bilateral: Secondary | ICD-10-CM | POA: Diagnosis not present

## 2014-10-17 DIAGNOSIS — Z961 Presence of intraocular lens: Secondary | ICD-10-CM | POA: Diagnosis not present

## 2014-10-17 DIAGNOSIS — H3531 Nonexudative age-related macular degeneration: Secondary | ICD-10-CM | POA: Diagnosis not present

## 2014-10-17 DIAGNOSIS — H04123 Dry eye syndrome of bilateral lacrimal glands: Secondary | ICD-10-CM | POA: Diagnosis not present

## 2014-10-17 DIAGNOSIS — H40011 Open angle with borderline findings, low risk, right eye: Secondary | ICD-10-CM | POA: Diagnosis not present

## 2014-11-28 DIAGNOSIS — R7989 Other specified abnormal findings of blood chemistry: Secondary | ICD-10-CM | POA: Diagnosis not present

## 2014-11-28 DIAGNOSIS — E039 Hypothyroidism, unspecified: Secondary | ICD-10-CM | POA: Diagnosis not present

## 2015-01-22 DIAGNOSIS — R5383 Other fatigue: Secondary | ICD-10-CM | POA: Diagnosis not present

## 2015-01-22 DIAGNOSIS — Z23 Encounter for immunization: Secondary | ICD-10-CM | POA: Diagnosis not present

## 2015-01-22 DIAGNOSIS — E039 Hypothyroidism, unspecified: Secondary | ICD-10-CM | POA: Diagnosis not present

## 2015-01-22 DIAGNOSIS — M25551 Pain in right hip: Secondary | ICD-10-CM | POA: Diagnosis not present

## 2015-01-22 DIAGNOSIS — M545 Low back pain: Secondary | ICD-10-CM | POA: Diagnosis not present

## 2015-01-28 DIAGNOSIS — Z23 Encounter for immunization: Secondary | ICD-10-CM | POA: Diagnosis not present

## 2015-03-31 DIAGNOSIS — R5382 Chronic fatigue, unspecified: Secondary | ICD-10-CM | POA: Diagnosis not present

## 2015-03-31 DIAGNOSIS — E782 Mixed hyperlipidemia: Secondary | ICD-10-CM | POA: Diagnosis not present

## 2015-03-31 DIAGNOSIS — K581 Irritable bowel syndrome with constipation: Secondary | ICD-10-CM | POA: Diagnosis not present

## 2015-03-31 DIAGNOSIS — F5104 Psychophysiologic insomnia: Secondary | ICD-10-CM | POA: Diagnosis not present

## 2015-03-31 DIAGNOSIS — E890 Postprocedural hypothyroidism: Secondary | ICD-10-CM | POA: Diagnosis not present

## 2015-03-31 DIAGNOSIS — R946 Abnormal results of thyroid function studies: Secondary | ICD-10-CM | POA: Diagnosis not present

## 2015-04-24 DIAGNOSIS — G8929 Other chronic pain: Secondary | ICD-10-CM | POA: Diagnosis not present

## 2015-04-24 DIAGNOSIS — M545 Low back pain: Secondary | ICD-10-CM | POA: Diagnosis not present

## 2015-04-24 DIAGNOSIS — M533 Sacrococcygeal disorders, not elsewhere classified: Secondary | ICD-10-CM | POA: Diagnosis not present

## 2015-04-24 DIAGNOSIS — M25551 Pain in right hip: Secondary | ICD-10-CM | POA: Diagnosis not present

## 2015-05-29 ENCOUNTER — Encounter: Payer: Self-pay | Admitting: Medical

## 2015-05-29 ENCOUNTER — Ambulatory Visit (INDEPENDENT_AMBULATORY_CARE_PROVIDER_SITE_OTHER): Payer: Medicare Other | Admitting: Medical

## 2015-05-29 VITALS — BP 116/80 | HR 86 | Temp 97.7°F | Ht 67.5 in | Wt 211.8 lb

## 2015-05-29 DIAGNOSIS — J0100 Acute maxillary sinusitis, unspecified: Secondary | ICD-10-CM

## 2015-05-29 DIAGNOSIS — J209 Acute bronchitis, unspecified: Secondary | ICD-10-CM

## 2015-05-29 MED ORDER — AZITHROMYCIN 250 MG PO TABS: ORAL_TABLET | ORAL | Status: DC

## 2015-05-29 MED ORDER — FLUTICASONE PROPIONATE 50 MCG/ACT NA SUSP: 2.0000 | Freq: Every day | NASAL | Status: DC

## 2015-05-29 MED ORDER — BENZONATATE 100 MG PO CAPS
100.0000 mg | ORAL_CAPSULE | Freq: Three times a day (TID) | ORAL | Status: DC | PRN
Start: 2015-05-29 — End: 2015-06-05

## 2015-05-29 MED FILL — FLUTICASONE PROP 50 MCG SPR: 50 | 30 days supply | Qty: 16 | Fill #0

## 2015-05-29 MED FILL — AZITHROMYCIN 250 MG TABLET: 250 | 5 days supply | Qty: 6 | Fill #0

## 2015-05-29 MED FILL — BENZONATATE 100 MG CAPSULE: 100 | 7 days supply | Qty: 21 | Fill #0

## 2015-05-29 NOTE — Progress Notes (Signed)
Pre visit review using our clinic review tool, if applicable. No additional management support is needed unless otherwise documented below in the visit note. 

## 2015-05-29 NOTE — Patient Instructions (Addendum)
You appear to have bronchitis and sinusitis. Rest hydrate and tylenol for fever. I am prescribing cough medicine bezonatate, and azithromycin antibiotic. For your nasal congestion I prescribed flonase.  You should gradually get better. If not then notify us and would consider a chest xray.  Follow up in 7-10 days or as needed(for above illness)   Please schedule appointment with me end of February. Will get thyroid study and review health maintenance when we get your prior records.

## 2015-05-29 NOTE — Progress Notes (Signed)
Subjective:    Patient ID: Mallory RoyaltyLinda Becherer, female    DOB: 06-04-1940, 75 y.o.   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  Pt in for sinus pressure and pain. Some chest congestion. Symptoms since Saturday. Pt states she has history of sinus infection and bronchitis pretty easily. No sinus infection or bronchitis for past 2 years. Mild wheeze last night. Hx of seeing pulmonologist in the past.  No fever, no chills or sweats. When blows nose will get mucous. When cough gets up mucous.   Pt has no recent inhaler use.   Pt has pcp with [facility]. She has had some labs with them over past past year. Pt hs hyperlipidemia and  hypothyroid. Pt is scheduled to have check end of February.  Last lipid check was within last year. She states lipid were farily well controlled.  Pt has some depression in the past. She is on Wellbutrin and sertraline.  Pt is new and we don't have full records yet.    Review of Systems  Constitutional: Negative for chills and fatigue.  HENT: Positive for congestion and sinus pressure. Negative for ear pain, facial swelling, hearing loss, postnasal drip, rhinorrhea and sneezing.   Respiratory: Positive for cough. Negative for shortness of breath and wheezing.   Cardiovascular: Negative for chest pain and palpitations.  Musculoskeletal: Negative for back pain.  Neurological: Negative for dizziness and headaches.  Hematological: Negative for adenopathy. Does not bruise/bleed easily.  Psychiatric/Behavioral: Negative for behavioral problems and confusion.    Past Medical History  Diagnosis Date  . Migraines   . Hyperlipidemia   . Depression   . Hypothyroid   . Renal insufficiency   . Orthostatic hypotension   . Anxiety   . Neck pain   . Left shoulder pain   . Hx of vertigo   . Confusion, hx of, without neuro findings     Social History   Social History  .  Marital Status: Married    Spouse Name: N/A  . Number of Children: 2  . Years of Education: N/A   Occupational History  .     Social History Main Topics  . Smoking status: Never Smoker   . Smokeless tobacco: Never Used  . Alcohol Use: No  . Drug Use: No  . Sexual Activity: Not on file   Other Topics Concern  . Not on file   Social History Narrative    Past Surgical History  Procedure Laterality Date  . Joint replacement    . Abdominal hysterectomy    . Thyroid surgery    . Cholecystectomy    . Tonsillectomy    . Abdominal hysterectomy    . Breast lumpectomy      Family History  Problem Relation Age of Onset  . Heart disease Mother     Angina  . Cancer Mother   . Cancer Father   . Heart disease Father   . Cancer Maternal Aunt     No Known Allergies  Current Outpatient Prescriptions on File Prior to Visit  Medication Sig Dispense Refill  . buPROPion (WELLBUTRIN XL) 150 MG 24 hr tablet Take 150 mg by mouth daily.    . divalproex (DEPAKOTE) 500 MG DR tablet Take 500 mg by mouth daily.    Marland Kitchen. gabapentin (NEURONTIN) 300 MG capsule Take 600 mg by mouth 2 (two) times daily.    Marland Kitchen. HYDROcodone-acetaminophen (NORCO/VICODIN) 5-325 MG  per tablet Take 1 tablet by mouth every 6 (six) hours as needed for moderate pain. 30 tablet 0  . sertraline (ZOLOFT) 100 MG tablet 1 1/2  Tab po qd    . simvastatin (ZOCOR) 20 MG tablet Take 20 mg by mouth every evening.    . SUMAtriptan (IMITREX) 100 MG tablet Take 100 mg by mouth every 2 (two) hours as needed.    . zolpidem (AMBIEN) 10 MG tablet Take 10 mg by mouth at bedtime as needed.     No current facility-administered medications on file prior to visit.    BP 116/80 mmHg  Pulse 86  Temp(Src) 97.7 F (36.5 C) (Oral)  Ht 5' 7.5" (1.715 m)  Wt 211 lb 12.8 oz (96.072 kg)  BMI 32.66 kg/m2  SpO2 97%  LMP        Objective:   Physical Exam  General  Mental Status - Alert. General Appearance - Well groomed. Not in acute  distress.  Skin Rashes- No Rashes.  HEENT Head- Normal. Ear Auditory Canal - Left- Normal. Right - Normal.Tympanic Membrane- Left- Normal. Right- Normal. Eye Sclera/Conjunctiva- Left- Normal. Right- Normal. Nose & Sinuses Nasal Mucosa- Left-  Boggy and Congested. Right-  Boggy and  Congested.Bilateral maxillary and frontal sinus pressure. Mouth & Throat Lips: Upper Lip- Normal: no dryness, cracking, pallor, cyanosis, or vesicular eruption. Lower Lip-Normal: no dryness, cracking, pallor, cyanosis or vesicular eruption. Buccal Mucosa- Bilateral- No Aphthous ulcers. Oropharynx- No Discharge or Erythema. Tonsils: Characteristics- Bilateral- No Erythema or Congestion. Size/Enlargement- Bilateral- No enlargement. Discharge- bilateral-None.  Neck Neck- Supple. No Masses.   Chest and Lung Exam Auscultation: Breath Sounds:-Clear even and unlabored.  Cardiovascular Auscultation:Rythm- Regular, rate and rhythm. Murmurs & Other Heart Sounds:Ausculatation of the heart reveal- No Murmurs.  Lymphatic Head & Neck General Head & Neck Lymphatics: Bilateral: Description- No Localized lymphadenopathy.       Assessment & Plan:  You appear to have bronchitis and sinusitis. Rest hydrate and tylenol for fever. I am prescribing cough medicine bezonatate, and azithromycin antibiotic. For your nasal congestion I prescribed flonase.  You should gradually get better. If not then notify us and would consider a chest xray.  Follow up in 7-10 days or as needed(for above illness)   Please schedule appointment with me end of February. Will get thyroid study and review health maintenance when we get your prior records.

## 2015-06-05 ENCOUNTER — Ambulatory Visit (INDEPENDENT_AMBULATORY_CARE_PROVIDER_SITE_OTHER): Payer: Medicare Other | Admitting: Medical

## 2015-06-05 ENCOUNTER — Encounter: Payer: Self-pay | Admitting: Medical

## 2015-06-05 VITALS — BP 110/70 | HR 81 | Temp 97.1°F | Ht 67.5 in | Wt 211.0 lb

## 2015-06-05 DIAGNOSIS — R5383 Other fatigue: Secondary | ICD-10-CM

## 2015-06-05 DIAGNOSIS — J0100 Acute maxillary sinusitis, unspecified: Secondary | ICD-10-CM

## 2015-06-05 LAB — COMPREHENSIVE METABOLIC PANEL
ALBUMIN: 3.7 g/dL (ref 3.5–5.2)
ALT: 11 U/L (ref 0–35)
AST: 16 U/L (ref 0–37)
Alkaline Phosphatase: 76 U/L (ref 39–117)
BUN: 7 mg/dL (ref 6–23)
CALCIUM: 8.7 mg/dL (ref 8.4–10.5)
CHLORIDE: 106 meq/L (ref 96–112)
CO2: 26 meq/L (ref 19–32)
CREATININE: 0.93 mg/dL (ref 0.40–1.20)
GFR: 62.55 mL/min (ref >60.00)
Glucose, Bld: 82 mg/dL (ref 70–99)
POTASSIUM: 3.7 meq/L (ref 3.5–5.1)
SODIUM: 141 meq/L (ref 135–145)
Total Bilirubin: 0.3 mg/dL (ref 0.2–1.2)
Total Protein: 6.4 g/dL (ref 6.0–8.3)

## 2015-06-05 LAB — CBC WITH DIFFERENTIAL/PLATELET
BASOS PCT: 0.6 % (ref 0.0–3.0)
Basophils Absolute: 0 10*3/uL (ref 0.0–0.1)
EOS ABS: 0.2 10*3/uL (ref 0.0–0.7)
EOS PCT: 3.3 % (ref 0.0–5.0)
HEMATOCRIT: 41.3 % (ref 36.0–46.0)
HEMOGLOBIN: 13.2 g/dL (ref 12.0–15.0)
Lymphocytes Relative: 29.7 % (ref 12.0–46.0)
Lymphs Abs: 1.9 10*3/uL (ref 0.7–4.0)
MCHC: 32.1 g/dL (ref 30.0–36.0)
MCV: 89.2 fl (ref 78.0–100.0)
MONO ABS: 0.5 10*3/uL (ref 0.1–1.0)
Monocytes Relative: 8.1 % (ref 3.0–12.0)
NEUTROS ABS: 3.7 10*3/uL (ref 1.4–7.7)
Neutrophils Relative %: 58.3 % (ref 43.0–77.0)
PLATELETS: 318 10*3/uL (ref 150.0–400.0)
RBC: 4.63 Mil/uL (ref 3.87–5.11)
RDW: 13 % (ref 11.5–15.5)
WBC: 6.3 10*3/uL (ref 4.0–10.5)

## 2015-06-05 LAB — TSH: TSH: 0.17 u[IU]/mL — AB (ref 0.35–4.50)

## 2015-06-05 MED ORDER — DOXYCYCLINE HYCLATE 100 MG PO TABS: 100.0000 mg | ORAL_TABLET | Freq: Two times a day (BID) | ORAL | Status: DC

## 2015-06-05 MED FILL — DOXYCYCLINE 100 MG TABLET: 100 | 10 days supply | Qty: 20 | Fill #0

## 2015-06-05 NOTE — Progress Notes (Signed)
Pre visit review using our clinic review tool, if applicable. No additional management support is needed unless otherwise documented below in the visit note. 

## 2015-06-05 NOTE — Progress Notes (Signed)
Subjective:    Patient ID: Mallory Durham, female    DOB: 11-Aug-1940, 75 y.o.   MRN: [id number]  HPI  Pt in for follow up. Yesterday she states had severe sinus pressure. Pt states on monday this week like she got better after the 5 day of zithromycin. Today her sinus feel better but some pressure still.  Hx of chronic sinus infection in past. So she states sometimes infection will persist.   Mild cough on and off. Not keeping her up at night. Pt not wheezing.   Pt states some fatigue since illness.       Review of Systems  Constitutional: Positive for fatigue. Negative for fever and chills.  HENT: Positive for congestion and sinus pressure. Negative for ear pain, hearing loss, nosebleeds and sneezing.   Respiratory: Positive for cough. Negative for choking, chest tightness, shortness of breath and wheezing.   Cardiovascular: Negative for chest pain and palpitations.  Musculoskeletal: Negative for back pain.  Neurological: Negative for dizziness, seizures, weakness and headaches.  Hematological: Negative for adenopathy. Does not bruise/bleed easily.  Psychiatric/Behavioral: Negative for behavioral problems and confusion.     Past Medical History  Diagnosis Date  . Migraines   . Hyperlipidemia   . Depression   . Hypothyroid   . Renal insufficiency   . Orthostatic hypotension   . Anxiety   . Neck pain   . Left shoulder pain   . Hx of vertigo   . Confusion, hx of, without neuro findings     Social History   Social History  . Marital Status: Married    Spouse Name: N/A  . Number of Children: 2  . Years of Education: N/A   Occupational History  .     Social History Main Topics  . Smoking status: Never Smoker   . Smokeless tobacco: Never Used  . Alcohol Use: No  . Drug Use: No  . Sexual Activity: Not on file   Other Topics Concern  . Not on file   Social History Narrative    Past Surgical History  Procedure Laterality Date  . Joint replacement    .  Abdominal hysterectomy    . Thyroid surgery    . Cholecystectomy    . Tonsillectomy    . Abdominal hysterectomy    . Breast lumpectomy      Family History  Problem Relation Age of Onset  . Heart disease Mother     Angina  . Cancer Mother   . Cancer Father   . Heart disease Father   . Cancer Maternal Aunt     No Known Allergies  Current Outpatient Prescriptions on File Prior to Visit  Medication Sig Dispense Refill  . buPROPion (WELLBUTRIN XL) 150 MG 24 hr tablet Take 150 mg by mouth daily.    . divalproex (DEPAKOTE) 500 MG DR tablet Take 500 mg by mouth daily.    . fluticasone (FLONASE) 50 MCG/ACT nasal spray Place 2 sprays into both nostrils daily. 16 g 1  . gabapentin (NEURONTIN) 300 MG capsule Take 600 mg by mouth 2 (two) times daily.    Marland Kitchen HYDROcodone-acetaminophen (NORCO/VICODIN) 5-325 MG per tablet Take 1 tablet by mouth every 6 (six) hours as needed for moderate pain. 30 tablet 0  . levothyroxine (SYNTHROID, LEVOTHROID) 200 MCG tablet Take 200 mcg by mouth daily before breakfast.    . Multiple Vitamins-Minerals (PRESERVISION AREDS 2) CAPS Take 2 capsules by mouth.    . sertraline (ZOLOFT) 100 MG tablet 1  1/2  Tab po qd    . simvastatin (ZOCOR) 20 MG tablet Take 20 mg by mouth every evening.    . SUMAtriptan (IMITREX) 100 MG tablet Take 100 mg by mouth every 2 (two) hours as needed.    . zolpidem (AMBIEN) 10 MG tablet Take 10 mg by mouth at bedtime as needed.     No current facility-administered medications on file prior to visit.    BP 110/70 mmHg  Pulse 81  Temp(Src) 97.1 F (36.2 C) (Oral)  Ht 5' 7.5" (1.715 m)  Wt 211 lb (95.709 kg)  BMI 32.54 kg/m2  SpO2 98%       Objective:   Physical Exam   General  Mental Status - Alert. General Appearance - Well groomed. Not in acute distress.  Skin Rashes- No Rashes.  HEENT Head- Normal. Ear Auditory Canal - Left- Normal. Right - Normal.Tympanic Membrane- Left- Normal. Right- Normal. Eye  Sclera/Conjunctiva- Left- Normal. Right- Normal. Nose & Sinuses Nasal Mucosa- Left-  Boggy and Congested. Right-  Boggy and  Congested.Bilateral no maxillary and  No frontal sinus pressure. Mouth & Throat Lips: Upper Lip- Normal: no dryness, cracking, pallor, cyanosis, or vesicular eruption. Lower Lip-Normal: no dryness, cracking, pallor, cyanosis or vesicular eruption. Buccal Mucosa- Bilateral- No Aphthous ulcers. Oropharynx- No Discharge or Erythema. Tonsils: Characteristics- Bilateral- No Erythema or Congestion. Size/Enlargement- Bilateral- No enlargement. Discharge- bilateral-None.  Neck Neck- Supple. No Masses.   Chest and Lung Exam Auscultation: Breath Sounds:-Clear even and unlabored.  Cardiovascular Auscultation:Rythm- Regular, rate and rhythm. Murmurs & Other Heart Sounds:Ausculatation of the heart reveal- No Murmurs.  Lymphatic Head & Neck General Head & Neck Lymphatics: Bilateral: Description- No Localized lymphadenopathy.      Assessment & Plan:  You appear to have possible persisting  sinus infection . I am prescribing doxycycline  antibiotic for the infection. To help with the nasal congestion congestion continue flonase. For your associated cough, I prescribed cough medicine benzonatate.(continue that last prescription if needed)  For your fatigue will get cbc and cmp. Include tsh  Rest, hydrate, tylenol for fever.  Follow up in 7 days or as needed.

## 2015-06-05 NOTE — Patient Instructions (Addendum)
You appear to have possible persisting  sinus infection . I am prescribing doxycycline  antibiotic for the infection. To help with the nasal congestion congestion continue your flonase. For your associated cough, I prescribed cough medicine benzonatate.(continue that last prescription if needed)  For your fatigue will get cbc and cmp. Include tsh  Rest, hydrate, tylenol for fever.  Follow up in 7 days or as needed.  Otc probiotics over next month

## 2015-06-06 ENCOUNTER — Telehealth: Payer: Self-pay | Admitting: Medical

## 2015-06-06 NOTE — Telephone Encounter (Signed)
Left message about labs and for pt to call back.

## 2015-06-06 NOTE — Telephone Encounter (Signed)
Caller name: Self  Can be reached: [phone]  Reason for call: Requesting lab results from yesterday

## 2015-06-09 ENCOUNTER — Telehealth: Payer: Self-pay | Admitting: Medical

## 2015-06-09 DIAGNOSIS — E039 Hypothyroidism, unspecified: Secondary | ICD-10-CM

## 2015-06-09 NOTE — Telephone Encounter (Signed)
When she calls ask about when she sees specialist. And ask her about any adjustment she has made recently to dosing. She told me she had recently taken more than recommended. If she does not have appointment with any specialist/endocrinologist then I need to write new reduced thyroid dose. I left message with pt on June 09, 2015.

## 2015-06-09 NOTE — Telephone Encounter (Signed)
Patient returned your call from Friday.  FYI: The strength of Synthroid that she currently takes is .

## 2015-06-09 NOTE — Telephone Encounter (Signed)
Mailed letter to patient 06/09/15.

## 2015-06-09 NOTE — Telephone Encounter (Signed)
error:[id number] ° °

## 2015-06-11 NOTE — Telephone Encounter (Signed)
See note below

## 2015-06-11 NOTE — Telephone Encounter (Signed)
Pt called in. Informed pt of message below. Pt says that she doesn't have an appt with a specialist. She says that she hasn't seen a specialist since 2000. She says that she never took the initiative to change her own dosage she says that she has always been under a providers care when adjustments has been made. Pt says that if PCP suggest that she sees a specialist then she will be okay with it. Pt also states that she feels better on her current dosage. Better than any other dosage that she has been on.

## 2015-06-12 MED ORDER — LEVOTHYROXINE SODIUM 150 MCG PO TABS: 150.0000 ug | ORAL_TABLET | Freq: Every day | ORAL | Status: DC

## 2015-06-12 NOTE — Telephone Encounter (Signed)
I want her to stop her 200 mcg dose of levothyroxine. Start on lower dose 150 mcg 1 tab po q day. Repeat tsh in one month. Sooner if with dose change she has any new signs or symptoms.

## 2015-06-12 NOTE — Telephone Encounter (Signed)
Did you get message on her new levothyroxine dose.

## 2015-06-13 NOTE — Telephone Encounter (Signed)
Pt returned a call.   CB: [phone]

## 2015-06-13 NOTE — Telephone Encounter (Signed)
Spoke to the patient and informed of instructions.  She feels better on the 200 mcg than she has in a long time.  She wants to stay on the 200 mcg until you are able to review her medical records once you receive.  She also wondered if she should see an endocrinologist?  Advise once records received.

## 2015-06-13 NOTE — Telephone Encounter (Signed)
Called left message to call back 

## 2015-06-13 NOTE — Telephone Encounter (Signed)
Pt returning call again - try home # first [phone], if no answer call cell [phone].

## 2015-06-13 NOTE — Telephone Encounter (Signed)
With pt tsh being low. I would want endocrinologist opinion before recommending 200 mcg dose or writing rx for that dose. So if she is not going to take the 150 mcg dose let me know and I will go ahead and write referral to endocrinologist.

## 2015-06-16 NOTE — Telephone Encounter (Signed)
See previous note from [place]

## 2015-06-16 NOTE — Telephone Encounter (Signed)
Spoke with pt's spouse and advised him that PCP had put in a referral to endocrinology.

## 2015-06-16 NOTE — Telephone Encounter (Signed)
Pt came to me on thyroid med dose 200 mcg. Her tsh is low. I want to decrease dose. Pt wants to maintain same high dose. Will refer to endocrinologist. During the interim. Will repeat tsh, t3 and t4. Order of labs placed.

## 2015-07-07 ENCOUNTER — Ambulatory Visit: Payer: Medicare Other | Admitting: Endocrinology

## 2015-07-16 ENCOUNTER — Telehealth: Payer: Self-pay | Admitting: Medical

## 2015-07-16 ENCOUNTER — Encounter: Payer: Medicare Other | Admitting: Medical

## 2015-07-16 NOTE — Progress Notes (Signed)
This encounter was created in error - please disregard.

## 2015-07-17 ENCOUNTER — Encounter: Payer: Self-pay | Admitting: Internal Medicine

## 2015-07-17 ENCOUNTER — Ambulatory Visit (INDEPENDENT_AMBULATORY_CARE_PROVIDER_SITE_OTHER): Payer: Medicare Other | Admitting: Internal Medicine

## 2015-07-17 VITALS — BP 112/68 | HR 91 | Temp 97.7°F | Resp 12 | Ht 67.0 in | Wt 214.0 lb

## 2015-07-17 DIAGNOSIS — E890 Postprocedural hypothyroidism: Secondary | ICD-10-CM | POA: Diagnosis not present

## 2015-07-17 MED ORDER — SYNTHROID 175 MCG PO TABS: 175.0000 ug | ORAL_TABLET | Freq: Every day | ORAL | Status: DC

## 2015-07-17 MED FILL — SYNTHROID 175 MCG TABLET: 175 | 45 days supply | Qty: 45 | Fill #0

## 2015-07-17 NOTE — Telephone Encounter (Signed)
Pt was no show 07/16/15 11:00am for follow up appt, pt has not rescheduled, 1st no show, charge or no charge?

## 2015-07-17 NOTE — Progress Notes (Signed)
Patient ID: Mallory Durham, female   DOB: 1940/08/23, 75 y.o.   MRN: [id number]   HPI  Mallory Durham is a 75 y.o.-year-old female, referred by her PCP, Saguier, Kateri Mc, for management of hypothyroidism.  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, complete thyroidectomy in 2000 - recurring thyroid cyst); is on Levothyroxine 200 (frequently fluctuating doses) mcg, taken: - fasting - with water - separated by >30 min from b'fast  - + calcium - Tums at night (occasionally) - no iron - + PPIs OTC at night (occasionally) - + multivitamins at night  I reviewed pt's thyroid tests - last TSH was low -she was advised by PCP to decrease the dose of her levothyroxine from 200 to 150 g, but she did not do this pending this appointment:  Lab Results  Component Value Date   TSH 0.17* 06/05/2015  03/31/2015: TSH 6.82, fT4 1.24 01/22/2015: TSH 0.420 11/28/2014: TSH 5.440  TPO ABs per [facility] records - ? Why checked since pt had thyroidectomy in 2000...:  03/31/2015: TPO Abs <6 10/03/2013: TPO Abs 7 07/02/2010: TPO Abs <6  Pt describes: - no weight gain - + fatigue, + weakness (improved now, not uses a walker anymore) - no cold intolerance - + depression - + constipation - no dry skin - no hair loss  Pt denies feeling nodules in neck, hoarseness,  she does have occasional dysphagia/no odynophagia,  no SOB with lying down.  She has + FH of thyroid disorders in mother and sister: hypothyroidism. No FH of thyroid cancer.  No h/o radiation tx to head or neck. No recent use of iodine supplements.  I reviewed her chart and she also has a history of hysterectomy in 1994. Also, HL, GERD, depression, ruptured Achilles tendon 2015.  ROS: Constitutional: + weight gain, + fatigue, + hot flushes, + poor sleep, no nocturia Eyes: + blurry vision, no xerophthalmia ENT: no sore throat, no nodules palpated in throat, + occasional dysphagia/no  odynophagia, no hoarseness, + tinnitus, + hypoacusis Cardiovascular: no CP/+ SOB/no palpitations/+ leg swelling Respiratory: no cough/+ SOB Gastrointestinal: No N/+ V/D/+ C, + heartburn Musculoskeletal: + muscle aches/+ joint aches Skin: no rashes, + easy bruising, no hair loss Neurological: no tremors/numbness/tingling/dizziness, + HA Psychiatric: + depression/no anxiety + Low libido  Past Medical History  Diagnosis Date  . Migraines   . Hyperlipidemia   . Depression   . Hypothyroid   . Renal insufficiency   . Orthostatic hypotension   . Anxiety   . Neck pain   . Left shoulder pain   . Hx of vertigo   . Confusion, hx of, without neuro findings    Past Surgical History  Procedure Laterality Date  . Joint replacement    . Abdominal hysterectomy    . Thyroid surgery    . Cholecystectomy    . Tonsillectomy    . Abdominal hysterectomy    . Breast lumpectomy     Social History   Social History  . Marital Status: Married    Spouse Name: N/A  . Number of Children: 2  . Years of Education: N/A   Occupational History  .     Social History Main Topics  . Smoking status: Never Smoker   . Smokeless tobacco: Never Used  . Alcohol Use: No  . Drug Use: No  . Sexual Activity: Not on file   Other Topics Concern  . Not on file   Social History Narrative   Current  Outpatient Prescriptions on File Prior to Visit  Medication Sig Dispense Refill  . buPROPion (WELLBUTRIN XL) 150 MG 24 hr tablet Take 150 mg by mouth daily.    . divalproex (DEPAKOTE) 500 MG DR tablet Take 500 mg by mouth daily.    Marland Kitchen doxycycline (VIBRA-TABS) 100 MG tablet Take 1 tablet (100 mg total) by mouth 2 (two) times daily. 20 tablet 0  . fluticasone (FLONASE) 50 MCG/ACT nasal spray Place 2 sprays into both nostrils daily. 16 g 1  . gabapentin (NEURONTIN) 300 MG capsule Take 600 mg by mouth 2 (two) times daily.    Marland Kitchen HYDROcodone-acetaminophen (NORCO/VICODIN) 5-325 MG per tablet Take 1 tablet by mouth every  6 (six) hours as needed for moderate pain. 30 tablet 0  . levothyroxine (SYNTHROID, LEVOTHROID) 150 MCG tablet Take 1 tablet (150 mcg total) by mouth daily. 30 tablet 0  . Multiple Vitamins-Minerals (PRESERVISION AREDS 2) CAPS Take 2 capsules by mouth.    . sertraline (ZOLOFT) 100 MG tablet 1 1/2  Tab po qd    . simvastatin (ZOCOR) 20 MG tablet Take 20 mg by mouth every evening.    . SUMAtriptan (IMITREX) 100 MG tablet Take 100 mg by mouth every 2 (two) hours as needed.    . zolpidem (AMBIEN) 10 MG tablet Take 10 mg by mouth at bedtime as needed.     No current facility-administered medications on file prior to visit.   No Known Allergies Family History  Problem Relation Age of Onset  . Heart disease Mother     Angina  . Cancer Mother   . Cancer Father   . Heart disease Father   . Cancer Maternal Aunt    PE: BP 112/68 mmHg  Pulse 91  Temp(Src) 97.7 F (36.5 C) (Oral)  Resp 12  Ht  (1.702 m)  Wt 214 lb (97.07 kg)  BMI 33.51 kg/m2  SpO2 96% Wt Readings from Last 3 Encounters:  07/17/15 214 lb (97.07 kg)  06/05/15 211 lb (95.709 kg)  05/29/15 211 lb 12.8 oz (96.072 kg)   Constitutional: overweight, in NAD Eyes: PERRLA, EOMI, no exophthalmos ENT: moist mucous membranes, no thyromegaly, thyroid scar healed, no cervical lymphadenopathy Cardiovascular:Tachycardia, RR, No MRG Respiratory: CTA B Gastrointestinal: abdomen soft, NT, ND, BS+ Musculoskeletal: no deformities, strength intact in all 4 Skin: moist, warm, no rashes Neurological: no tremor with outstretched hands, DTR normal in all 4  ASSESSMENT: 1.  Postsurgical Hypothyroidism  PLAN:  1. Patient with long-standing hypothyroidism, on levothyroxine therapy. She had fluctuating levels of TSH over time, with the last level being suppressed. She complains of increased fatigue, shortness of breath, and feeling her heart beating fast during walking.  - We discussed about the fact that we need to try to reduce the TSH  variability. I suggested that she switches from generic levothyroxine to brand name Synthroid. As she has some possible symptoms of thyrotoxicosis (shortness of breath, tachycardia), I suggested to decrease the dose to 175 g daily. She agrees with this changes. - We discussed about correct intake of levothyroxine, fasting, with water, separated by at least 30 minutes from breakfast, and separated by more than 4 hours from calcium, iron, multivitamins, acid reflux medications (PPIs).She is taking it correctly. - will check thyroid testsin 5-6 weeks on Synthroid 175 g daily: TSH, free T4 - I will see her back in 4 months

## 2015-07-17 NOTE — Telephone Encounter (Signed)
Patient confirmed

## 2015-07-17 NOTE — Telephone Encounter (Signed)
No charge. 

## 2015-07-17 NOTE — Patient Instructions (Signed)
Please stop Levothyroxine and start Synthroid 175 mcg daily.  Take the thyroid hormone every day, with water, at least 30 minutes before breakfast, separated by at least 4 hours from: - acid reflux medications - calcium - iron - multivitamins  Please come back for labs in 5-6 weeks.  Please return in 4 months.

## 2015-07-23 MED FILL — SIMVASTATIN 20 MG TABLET: 20 | 30 days supply | Qty: 30 | Fill #0

## 2015-07-23 MED FILL — GABAPENTIN 300 MG CAPSULE: 300 | 30 days supply | Qty: 120 | Fill #0

## 2015-07-23 MED FILL — BUPROPION HCL XL 150 MG TAB: 150 | 30 days supply | Qty: 30 | Fill #0

## 2015-07-24 ENCOUNTER — Encounter: Payer: Self-pay | Admitting: Medical

## 2015-07-24 ENCOUNTER — Ambulatory Visit (INDEPENDENT_AMBULATORY_CARE_PROVIDER_SITE_OTHER): Payer: Medicare Other | Admitting: Medical

## 2015-07-24 VITALS — BP 110/78 | HR 67 | Temp 97.7°F | Ht 67.0 in | Wt 215.8 lb

## 2015-07-24 DIAGNOSIS — R059 Cough, unspecified: Secondary | ICD-10-CM

## 2015-07-24 DIAGNOSIS — R05 Cough: Secondary | ICD-10-CM | POA: Diagnosis not present

## 2015-07-24 MED ORDER — ALBUTEROL SULFATE HFA 108 (90 BASE) MCG/ACT IN AERS: 2.0000 | INHALATION_SPRAY | Freq: Four times a day (QID) | RESPIRATORY_TRACT | Status: DC | PRN

## 2015-07-24 MED ORDER — AZITHROMYCIN 250 MG PO TABS: ORAL_TABLET | ORAL | Status: DC

## 2015-07-24 MED ORDER — BENZONATATE 200 MG PO CAPS: 200.0000 mg | ORAL_CAPSULE | Freq: Three times a day (TID) | ORAL | Status: DC | PRN

## 2015-07-24 MED FILL — BENZONATATE 200 MG CAPSULE: 200 | 10 days supply | Qty: 30 | Fill #0

## 2015-07-24 MED FILL — VENTOLIN HFA 90 MCG INHALER: 108 (90 BAS | 30 days supply | Qty: 18 | Fill #0

## 2015-07-24 MED FILL — AZITHROMYCIN 250 MG TABLET: 250 | 5 days supply | Qty: 6 | Fill #0

## 2015-07-24 NOTE — Patient Instructions (Addendum)
Early cough for one day. Possibly early bronchitis by lung exam. Rest and hydrate. Take benzonatate for cough. Since so early in illness would wait one day and see how your feel. But if same or worse by tomorrow  start zpack.  If any wheezing use albuterol.  If any worse chest congestion by ealry next week then chest xray.  Follow up in 7 days or as needed

## 2015-07-24 NOTE — Progress Notes (Signed)
Subjective:    Patient ID: Mallory Durham, female    DOB: [birth year], 75 y.o.   MRN: [id number]  HPI  Pt states last night some cough. It is dry but getting worse. Feels like has mucous in her chest  but not bringing anything up.  No fever,no chills, no sweats or body aches. No sneezing or itchy eyes.  No nasal congestion. Feels chest congestion.  Pt has history of sinusitis and bronchitis this year. She is concerned will get worse. She states she needed to rounds of antibiotic early this year since was not treated early.  Pt admits to use of inhalers in past with bronchitis but not recently.   Review of Systems  Constitutional: Negative for fever, chills and fatigue.  Respiratory: Positive for cough. Negative for chest tightness, shortness of breath and wheezing.   Cardiovascular: Negative for chest pain and palpitations.  Gastrointestinal: Negative for abdominal pain.  Musculoskeletal: Negative for myalgias and back pain.  Neurological: Negative for dizziness and headaches.  Hematological: Negative for adenopathy. Does not bruise/bleed easily.  Psychiatric/Behavioral: Negative for behavioral problems and confusion.   Past Medical History  Diagnosis Date  . Migraines   . Hyperlipidemia   . Depression   . Hypothyroid   . Renal insufficiency   . Orthostatic hypotension   . Anxiety   . Neck pain   . Left shoulder pain   . Hx of vertigo   . Confusion, hx of, without neuro findings     Social History   Social History  . Marital Status: Married    Spouse Name: N/A  . Number of Children: 2  . Years of Education: N/A   Occupational History  .     Social History Main Topics  . Smoking status: Never Smoker   . Smokeless tobacco: Never Used  . Alcohol Use: No  . Drug Use: No  . Sexual Activity: Not on file   Other Topics Concern  . Not on file   Social History Narrative    Past Surgical History  Procedure Laterality Date  . Joint replacement    . Abdominal  hysterectomy    . Thyroid surgery    . Cholecystectomy    . Tonsillectomy    . Abdominal hysterectomy    . Breast lumpectomy      Family History  Problem Relation Age of Onset  . Heart disease Mother     Angina  . Cancer Mother   . Cancer Father   . Heart disease Father   . Cancer Maternal Aunt     No Known Allergies  Current Outpatient Prescriptions on File Prior to Visit  Medication Sig Dispense Refill  . buPROPion (WELLBUTRIN XL) 150 MG 24 hr tablet Take 150 mg by mouth daily.    . divalproex (DEPAKOTE) 500 MG DR tablet Take 500 mg by mouth daily.    . fluticasone (FLONASE) 50 MCG/ACT nasal spray Place 2 sprays into both nostrils daily. 16 g 1  . gabapentin (NEURONTIN) 300 MG capsule Take 600 mg by mouth 2 (two) times daily.    . Multiple Vitamins-Minerals (PRESERVISION AREDS 2) CAPS Take 2 capsules by mouth.    . sertraline (ZOLOFT) 100 MG tablet 1 1/2  Tab po qd    . simvastatin (ZOCOR) 20 MG tablet Take 20 mg by mouth every evening.    . SUMAtriptan (IMITREX) 100 MG tablet Take 100 mg by mouth every 2 (two) hours as needed.    Marland Kitchen SYNTHROID 175 MCG  tablet Take 1 tablet (175 mcg total) by mouth daily before breakfast. 45 tablet 1  . zolpidem (AMBIEN) 10 MG tablet Take 10 mg by mouth at bedtime as needed.     No current facility-administered medications on file prior to visit.    BP 110/78 mmHg  Pulse 67  Temp(Src) 97.7 F (36.5 C) (Oral)  Ht 5\' 7"  (1.702 m)  Wt 215 lb 12.8 oz (97.886 kg)  BMI 33.79 kg/m2  SpO2 98%       Objective:   Physical Exam  General  Mental Status - Alert. General Appearance - Well groomed. Not in acute distress.  Skin Rashes- No Rashes.  HEENT Head- Normal. Ear Auditory Canal - Left- Normal. Right - Normal.Tympanic Membrane- Left- Normal. Right- Normal. Eye Sclera/Conjunctiva- Left- Normal. Right- Normal. Nose & Sinuses Nasal Mucosa- Left-  Not boggy or Congested. Right-  Not  boggy or Congested. No sinus pressure Mouth &  Throat Lips: Upper Lip- Normal: no dryness, cracking, pallor, cyanosis, or vesicular eruption. Lower Lip-Normal: no dryness, cracking, pallor, cyanosis or vesicular eruption. Buccal Mucosa- Bilateral- No Aphthous ulcers. Oropharynx- No Discharge or Erythema. Tonsils: Characteristics- Bilateral- No Erythema or Congestion. Size/Enlargement- Bilateral- No enlargement. Discharge- bilateral-None.  Neck Neck- Supple. No Masses.   Chest and Lung Exam Auscultation: Breath Sounds:- even and unlabored, but mild rough breath sounds at base of both lungs.  Cardiovascular Auscultation:Rythm- Regular, rate and rhythm. Murmurs & Other Heart Sounds:Ausculatation of the heart reveal- No Murmurs.  Lymphatic Head & Neck General Head & Neck Lymphatics: Bilateral: Description- No Localized lymphadenopathy. .      Assessment & Plan:  Early cough for one day. Possibly early bronchitis by lung exam. Rest and hydrate. Take benzonatate for cough. Since so early in illness would wait one day and see how your feel. But if same or worse by tomorrow  start zpack.  If any wheezing use albuterol.  If any worse chest congestion by ealry next week then chest xray.  Follow up in 7 days or as needed

## 2015-07-24 NOTE — Progress Notes (Signed)
Pre visit review using our clinic review tool, if applicable. No additional management support is needed unless otherwise documented below in the visit note. 

## 2015-07-28 ENCOUNTER — Ambulatory Visit (HOSPITAL_BASED_OUTPATIENT_CLINIC_OR_DEPARTMENT_OTHER)
Admission: RE | Admit: 2015-07-28 | Discharge: 2015-07-28 | Disposition: A | Discharge status: Home / Self Care | Payer: Medicare Other | Source: Ambulatory Visit | Attending: Medical | Admitting: Medical

## 2015-07-28 ENCOUNTER — Telehealth: Payer: Self-pay | Admitting: Medical

## 2015-07-28 ENCOUNTER — Ambulatory Visit (INDEPENDENT_AMBULATORY_CARE_PROVIDER_SITE_OTHER): Payer: Medicare Other | Admitting: Medical

## 2015-07-28 ENCOUNTER — Encounter: Payer: Self-pay | Admitting: Medical

## 2015-07-28 VITALS — BP 118/74 | HR 78 | Temp 98.1°F | Ht 67.0 in | Wt 214.4 lb

## 2015-07-28 DIAGNOSIS — J209 Acute bronchitis, unspecified: Secondary | ICD-10-CM

## 2015-07-28 DIAGNOSIS — R05 Cough: Secondary | ICD-10-CM

## 2015-07-28 DIAGNOSIS — R062 Wheezing: Secondary | ICD-10-CM

## 2015-07-28 DIAGNOSIS — R059 Cough, unspecified: Secondary | ICD-10-CM

## 2015-07-28 DIAGNOSIS — R0602 Shortness of breath: Secondary | ICD-10-CM | POA: Diagnosis not present

## 2015-07-28 MED ORDER — BECLOMETHASONE DIPROPIONATE 40 MCG/ACT IN AERS: 2.0000 | INHALATION_SPRAY | Freq: Two times a day (BID) | RESPIRATORY_TRACT | Status: DC

## 2015-07-28 MED ORDER — PREDNISONE 10 MG PO TABS: ORAL_TABLET | ORAL | Status: DC

## 2015-07-28 MED ORDER — SUMATRIPTAN SUCCINATE 100 MG PO TABS: 100.0000 mg | ORAL_TABLET | ORAL | Status: DC | PRN

## 2015-07-28 MED ORDER — HYDROCODONE-HOMATROPINE 5-1.5 MG/5ML PO SYRP: 5.0000 mL | ORAL_SOLUTION | Freq: Three times a day (TID) | ORAL | Status: DC | PRN

## 2015-07-28 MED FILL — SUMATRIPTAN SUCC 100 MG TAB: 100 | 5 days supply | Qty: 10 | Fill #0

## 2015-07-28 MED FILL — predniSONE 10 MG TABS: 10 | 5 days supply | Qty: 15 | Fill #0

## 2015-07-28 MED FILL — HYDROCODONE-HOMATROPINE SYR: 5-1.5 | 8 days supply | Qty: 120 | Fill #0

## 2015-07-28 NOTE — Patient Instructions (Addendum)
For cough stop benzonatate and start hycodan.  For wheezing add qvar inhaler and 5 day taper prednisone.  Will get chest xray today.  Consider second round antibiotic if xray + or if sinus pressure persists/worsens  Follow up in 7 days or as needed

## 2015-07-28 NOTE — Telephone Encounter (Signed)
Pt states that she is not feeling any better and wants to know what pcp advises. Per pcp to have pt come in for an evaluation.

## 2015-07-28 NOTE — Telephone Encounter (Signed)
Relation to ZO:XWRUpt:self Call back number: [phone] Pharmacy: [facility] [facility] - [place], [place] - [address] [phone] (Phone) [phone])         Reason for call:  Patient last seen 07/24/2015 and states she's experiencing coughing and wheezing in need of clinical advice

## 2015-07-28 NOTE — Progress Notes (Signed)
Pre visit review using our clinic review tool, if applicable. No additional management support is needed unless otherwise documented below in the visit note. 

## 2015-07-28 NOTE — Progress Notes (Signed)
Subjective:    Patient ID: Mallory Durham, female    DOB: February 23, 1941, 75 y.o.   MRN: [id number]  HPI  Pt in worse symptoms. She has moderate aggressive cough and occasional wheezing. Worse wheezing this morning. Pt came in past Thursday and I treated for very early bronchitis. I rx'd zithromax. Despite zpack her above symptoms worsened.  Pt has been using albuterol 2-3 times a day.  Pt states cough and breathing got worse on Friday.  Pt o2 sat 97% today.  No body aches or myalgias. No chills. Cough is dry. Not productive.    Review of Systems  Constitutional: Positive for fever. Negative for chills and fatigue.       Pt thought low grade fever on Friday.  HENT: Positive for sinus pressure. Negative for congestion, ear pain, hearing loss, nosebleeds, postnasal drip, rhinorrhea and sneezing.   Respiratory: Positive for cough and wheezing. Negative for chest tightness.   Gastrointestinal: Negative for abdominal pain and anal bleeding.  Musculoskeletal: Negative for back pain.  Neurological: Negative for dizziness, seizures, speech difficulty, weakness, light-headedness and headaches.  Hematological: Negative for adenopathy.  Psychiatric/Behavioral: Negative for behavioral problems, confusion, self-injury and dysphoric mood. The patient is not nervous/anxious.      Past Medical History  Diagnosis Date  . Migraines   . Hyperlipidemia   . Depression   . Hypothyroid   . Renal insufficiency   . Orthostatic hypotension   . Anxiety   . Neck pain   . Left shoulder pain   . Hx of vertigo   . Confusion, hx of, without neuro findings     Social History   Social History  . Marital Status: Married    Spouse Name: N/A  . Number of Children: 2  . Years of Education: N/A   Occupational History  .     Social History Main Topics  . Smoking status: Never Smoker   . Smokeless tobacco: Never Used  . Alcohol Use: No  . Drug Use: No  . Sexual Activity: Not on file   Other  Topics Concern  . Not on file   Social History Narrative    Past Surgical History  Procedure Laterality Date  . Joint replacement    . Abdominal hysterectomy    . Thyroid surgery    . Cholecystectomy    . Tonsillectomy    . Abdominal hysterectomy    . Breast lumpectomy      Family History  Problem Relation Age of Onset  . Heart disease Mother     Angina  . Cancer Mother   . Cancer Father   . Heart disease Father   . Cancer Maternal Aunt     No Known Allergies  Current Outpatient Prescriptions on File Prior to Visit  Medication Sig Dispense Refill  . albuterol (PROVENTIL HFA;VENTOLIN HFA) 108 (90 Base) MCG/ACT inhaler Inhale 2 puffs into the lungs every 6 (six) hours as needed for wheezing or shortness of breath. 1 Inhaler 0  . azithromycin (ZITHROMAX) 250 MG tablet Take 2 tablets by mouth on day 1, followed by 1 tablet by mouth daily for 4 days. 6 tablet 0  . benzonatate (TESSALON) 200 MG capsule Take 1 capsule (200 mg total) by mouth 3 (three) times daily as needed for cough. 30 capsule 0  . buPROPion (WELLBUTRIN XL) 150 MG 24 hr tablet Take 150 mg by mouth daily.    . divalproex (DEPAKOTE) 500 MG DR tablet Take 500 mg by mouth daily.    Marland Kitchen  fluticasone (FLONASE) 50 MCG/ACT nasal spray Place 2 sprays into both nostrils daily. 16 g 1  . gabapentin (NEURONTIN) 300 MG capsule Take 600 mg by mouth 2 (two) times daily.    . Multiple Vitamins-Minerals (PRESERVISION AREDS 2) CAPS Take 2 capsules by mouth.    . sertraline (ZOLOFT) 100 MG tablet 1 1/2  Tab po qd    . simvastatin (ZOCOR) 20 MG tablet Take 20 mg by mouth every evening.    Marland Kitchen. SYNTHROID 175 MCG tablet Take 1 tablet (175 mcg total) by mouth daily before breakfast. 45 tablet 1  . zolpidem (AMBIEN) 10 MG tablet Take 10 mg by mouth at bedtime as needed.     No current facility-administered medications on file prior to visit.    BP 118/74 mmHg  Pulse 78  Temp(Src) 98.1 F (36.7 C) (Oral)  Ht 5\' 7"  (1.702 m)  Wt 214 lb  6.4 oz (97.251 kg)  BMI 33.57 kg/m2  SpO2 97%       Objective:   Physical Exam  General  Mental Status - Alert. General Appearance - Well groomed. Not in acute distress.  Skin Rashes- No Rashes.  HEENT Head- Normal. Ear Auditory Canal - Left- Normal. Right - Normal.Tympanic Membrane- Left- Normal. Right- Normal. Eye Sclera/Conjunctiva- Left- Normal. Right- Normal. Nose & Sinuses Nasal Mucosa- Left-  Boggy and Congested. Right-  Boggy and  Congested.Bilateral faint  maxillary and faint  frontal sinus pressure. Mouth & Throat Lips: Upper Lip- Normal: no dryness, cracking, pallor, cyanosis, or vesicular eruption. Lower Lip-Normal: no dryness, cracking, pallor, cyanosis or vesicular eruption. Buccal Mucosa- Bilateral- No Aphthous ulcers. Oropharynx- No Discharge or Erythema. +pnd Tonsils: Characteristics- Bilateral- No Erythema or Congestion. Size/Enlargement- Bilateral- No enlargement. Discharge- bilateral-None.  Neck Neck- Supple. No Masses.   Chest and Lung Exam Auscultation: Breath Sounds:-even and unlabored. Only faint expiratory wheeze heard.  Cardiovascular Auscultation:Rythm- Regular, rate and rhythm. Murmurs & Other Heart Sounds:Ausculatation of the heart reveal- No Murmurs.  Lymphatic Head & Neck General Head & Neck Lymphatics: Bilateral: Description- No Localized lymphadenopathy.       Assessment & Plan:  For cough stop benzonatate and start hycodan.  For wheezing add qvar inhaler and 5 day taper prednisone.  Will get chest xray today.  Consider second round antibiotic if xray + or if sinus pressure persists/worsens  Follow up in 7 days or as needed

## 2015-07-30 ENCOUNTER — Telehealth: Payer: Self-pay | Admitting: *Deleted

## 2015-07-30 NOTE — Telephone Encounter (Signed)
Received Medical Records; forwarded to provider/SLS 03/15

## 2015-08-07 ENCOUNTER — Other Ambulatory Visit: Payer: Self-pay | Admitting: Medical

## 2015-08-11 MED FILL — SERTRALINE HCL 100 MG TAB: 100 | 30 days supply | Qty: 45 | Fill #0

## 2015-08-20 MED FILL — BUPROPION HCL XL 150 MG TAB: 150 | 30 days supply | Qty: 30 | Fill #1

## 2015-08-20 MED FILL — GABAPENTIN 300 MG CAPSULE: 300 | 30 days supply | Qty: 120 | Fill #1

## 2015-08-20 MED FILL — SIMVASTATIN 20 MG TABLET: 20 | 30 days supply | Qty: 30 | Fill #1

## 2015-08-26 ENCOUNTER — Other Ambulatory Visit (INDEPENDENT_AMBULATORY_CARE_PROVIDER_SITE_OTHER): Payer: Medicare Other

## 2015-08-26 DIAGNOSIS — E890 Postprocedural hypothyroidism: Secondary | ICD-10-CM

## 2015-08-26 LAB — TSH: TSH: 0.03 u[IU]/mL — ABNORMAL LOW (ref 0.35–4.50)

## 2015-08-26 LAB — T4, FREE: FREE T4: 1.44 ng/dL (ref 0.60–1.60)

## 2015-08-27 ENCOUNTER — Other Ambulatory Visit: Payer: Self-pay | Admitting: *Deleted

## 2015-08-27 DIAGNOSIS — E890 Postprocedural hypothyroidism: Secondary | ICD-10-CM

## 2015-08-27 MED ORDER — SYNTHROID 137 MCG PO TABS: 137.0000 ug | ORAL_TABLET | Freq: Every day | ORAL | Status: DC

## 2015-08-28 MED FILL — SYNTHROID 137 MCG TABLET: 137 | 30 days supply | Qty: 30 | Fill #0

## 2015-09-04 MED FILL — ZOLPIDEM TARTRATE 10 MG TAB: 10 | 30 days supply | Qty: 30 | Fill #0

## 2015-09-04 MED FILL — SUMATRIPTAN SUCC 100 MG TAB: 100 | 5 days supply | Qty: 10 | Fill #1

## 2015-09-15 MED FILL — DIVALPROEX SOD 500 MG TAB D: 500 | 30 days supply | Qty: 30 | Fill #0 | Status: TO

## 2015-09-15 MED FILL — SERTRALINE HCL 100 MG TAB: 100 | 30 days supply | Qty: 45 | Fill #1

## 2015-09-22 ENCOUNTER — Other Ambulatory Visit: Payer: Self-pay

## 2015-09-22 MED ORDER — BUPROPION HCL ER (XL) 150 MG PO TB24: 150.0000 mg | ORAL_TABLET | Freq: Every day | ORAL | Status: DC

## 2015-09-22 MED FILL — BUPROPION HCL XL 150 MG TAB: 150 | 30 days supply | Qty: 30 | Fill #0

## 2015-09-22 MED FILL — GABAPENTIN 300 MG CAPSULE: 300 | 30 days supply | Qty: 120 | Fill #2

## 2015-09-22 MED FILL — SIMVASTATIN 20 MG TABLET: 20 | 30 days supply | Qty: 30 | Fill #2

## 2015-10-02 ENCOUNTER — Other Ambulatory Visit: Payer: Self-pay | Admitting: Medical

## 2015-10-02 MED FILL — SYNTHROID 137 MCG TABLET: 137 | 30 days supply | Qty: 30 | Fill #1

## 2015-10-02 NOTE — Telephone Encounter (Signed)
Please advise on refill.  Last refill was 03/31/15.

## 2015-10-03 MED FILL — SUMATRIPTAN SUCC 100 MG TAB: 100 | 30 days supply | Qty: 10 | Fill #0 | Status: TO

## 2015-10-03 NOTE — Telephone Encounter (Signed)
Sent in imitrex to her pharmacy. Does she have headache now or just needs refills?

## 2015-10-06 NOTE — Telephone Encounter (Signed)
Left message for pt to call back and see if the medication was helping her headaches.

## 2015-10-07 NOTE — Telephone Encounter (Signed)
Spoke with pt and she states that the Imitrex has been helping her headaches.

## 2015-10-10 MED FILL — DIVALPROEX SOD 500 MG TAB D: 500 | 30 days supply | Qty: 30 | Fill #1 | Status: TO

## 2015-10-14 MED FILL — SERTRALINE HCL 100 MG TAB: 100 | 30 days supply | Qty: 45 | Fill #0

## 2015-10-21 MED FILL — BUPROPION HCL XL 150 MG TAB: 150 | 30 days supply | Qty: 30 | Fill #1

## 2015-10-22 DIAGNOSIS — H353112 Nonexudative age-related macular degeneration, right eye, intermediate dry stage: Secondary | ICD-10-CM | POA: Diagnosis not present

## 2015-10-22 DIAGNOSIS — H353122 Nonexudative age-related macular degeneration, left eye, intermediate dry stage: Secondary | ICD-10-CM | POA: Diagnosis not present

## 2015-10-22 DIAGNOSIS — H40011 Open angle with borderline findings, low risk, right eye: Secondary | ICD-10-CM | POA: Diagnosis not present

## 2015-10-22 DIAGNOSIS — H35031 Hypertensive retinopathy, right eye: Secondary | ICD-10-CM | POA: Diagnosis not present

## 2015-10-22 MED FILL — ZOLPIDEM TARTRATE 10 MG TAB: 10 | 30 days supply | Qty: 30 | Fill #0

## 2015-10-28 MED FILL — SIMVASTATIN 20 MG TABLET: 20 | 30 days supply | Qty: 30 | Fill #0

## 2015-11-03 MED FILL — SYNTHROID 137 MCG TABLET: 137 | 30 days supply | Qty: 30 | Fill #2

## 2015-11-03 MED FILL — GABAPENTIN 300 MG CAPSULE: 300 | 30 days supply | Qty: 120 | Fill #3

## 2015-11-20 ENCOUNTER — Ambulatory Visit (INDEPENDENT_AMBULATORY_CARE_PROVIDER_SITE_OTHER): Payer: Medicare Other | Admitting: Internal Medicine

## 2015-11-20 ENCOUNTER — Telehealth: Payer: Self-pay | Admitting: Medical

## 2015-11-20 ENCOUNTER — Encounter: Payer: Self-pay | Admitting: Internal Medicine

## 2015-11-20 VITALS — BP 90/60 | HR 88 | Ht 68.0 in | Wt 209.0 lb

## 2015-11-20 DIAGNOSIS — R031 Nonspecific low blood-pressure reading: Secondary | ICD-10-CM

## 2015-11-20 DIAGNOSIS — E890 Postprocedural hypothyroidism: Secondary | ICD-10-CM

## 2015-11-20 LAB — T4, FREE: Free T4: 0.92 ng/dL (ref 0.60–1.60)

## 2015-11-20 LAB — TSH: TSH: 0.24 u[IU]/mL — ABNORMAL LOW (ref 0.35–4.50)

## 2015-11-20 MED ORDER — SERTRALINE HCL 50 MG PO TABS: 50.0000 mg | ORAL_TABLET | Freq: Every day | ORAL | Status: DC

## 2015-11-20 NOTE — Telephone Encounter (Signed)
Relation to WU:JWJXpt:self Call back number:[phone] Pharmacy: [facility] [address], [place] [address] [phone]    Reason for call:  Patient requesting a refill sertraline (ZOLOFT) 100 MG tablet

## 2015-11-20 NOTE — Patient Instructions (Signed)
Please continue Synthroid 137 mcg daily.  Take the thyroid hormone every day, with water, at least 30 minutes before breakfast, separated by at least 4 hours from: - acid reflux medications - calcium - iron - multivitamins  Please stop at the lab.  Please come back for a follow-up appointment in 4 months. 

## 2015-11-20 NOTE — Progress Notes (Signed)
Patient ID: Mallory Durham, female   DOB: 1941-03-05, 75 y.o.   MRN: [id number]   HPI  Mallory Durham is a 75 y.o.-year-old female, returning for f/u for postsurgical hypothyroidism. Last visit 4 mo ago.  She feels weak today and BP is lower today.   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, complete thyroidectomy in 2000 - recurring thyroid cyst); is on Levothyroxine (frequently fluctuating doses) 175 >> Synthroid DAW 137 mcg (in 08/2015), taken: - fasting - with water - separated by >30 min from b'fast  - + calcium - Tums at night (occasionally) - no iron - + PPIs OTC at night (occasionally) - + AREDs 2 (no Ca or iron) 2x a day, first dose in am, with b'fast  I reviewed pt's thyroid tests:  Lab Results  Component Value Date   TSH 0.03* 08/26/2015   TSH 0.17* 06/05/2015   FREET4 1.44 08/26/2015  03/31/2015: TSH 6.82, fT4 1.24 01/22/2015: TSH 0.420 11/28/2014: TSH 5.440  TPO ABs per [facility] records - ? Why checked since pt had thyroidectomy in 2000...:  03/31/2015: TPO Abs <6 10/03/2013: TPO Abs 7 07/02/2010: TPO Abs <6  Pt describes: - no weight gain - + fatigue, + weakness (improved overall - but worse today) - no cold intolerance - no constipation - no dry skin - no hair loss  Pt denies feeling nodules in neck, hoarseness,  she does have occasional dysphagia/no odynophagia,  no SOB with lying down.  She has + FH of thyroid disorders in mother and sister: hypothyroidism. No FH of thyroid cancer.  No h/o radiation tx to head or neck. No recent use of iodine supplements.  She also has a history of hysterectomy in 1994. Also, HL, GERD, depression, ruptured Achilles tendon 2015.  ROS: Constitutional: + weight loss ([facility] and she does not enjoy the food there), + fatigue Eyes: no blurry vision, no xerophthalmia ENT: no sore throat, no nodules palpated in throat, + occasional dysphagia/no  odynophagia, no hoarseness, + tinnitus, + hypoacusis Cardiovascular: no CP/+ SOB/no palpitations/leg swelling Respiratory: no cough/+ SOB Gastrointestinal: No N/V/D/C, + heartburn Musculoskeletal: no muscle aches/ joint aches Skin: no rashes, no hair loss Neurological: no tremors/numbness/tingling/dizziness, + HA  I reviewed pt's medications, allergies, PMH, social hx, family hx, and changes were documented in the history of present illness. Otherwise, unchanged from my initial visit note.  Past Medical History  Diagnosis Date  . Migraines   . Hyperlipidemia   . Depression   . Hypothyroid   . Renal insufficiency   . Orthostatic hypotension   . Anxiety   . Neck pain   . Left shoulder pain   . Hx of vertigo   . Confusion, hx of, without neuro findings    Past Surgical History  Procedure Laterality Date  . Joint replacement    . Abdominal hysterectomy    . Thyroid surgery    . Cholecystectomy    . Tonsillectomy    . Abdominal hysterectomy    . Breast lumpectomy     Social History   Social History  . Marital Status: Married    Spouse Name: N/A  . Number of Children: 2  . Years of Education: N/A   Occupational History  .     Social History Main Topics  . Smoking status: Never Smoker   . Smokeless tobacco: Never Used  . Alcohol Use: No  . Drug Use: No  . Sexual Activity: Not  on file   Other Topics Concern  . Not on file   Social History Narrative   Current Outpatient Prescriptions on File Prior to Visit  Medication Sig Dispense Refill  . buPROPion (WELLBUTRIN XL) 150 MG 24 hr tablet Take 1 tablet (150 mg total) by mouth daily. 30 tablet 1  . divalproex (DEPAKOTE) 500 MG DR tablet Take 500 mg by mouth daily.    . fluticasone (FLONASE) 50 MCG/ACT nasal spray Place 2 sprays into both nostrils daily. 16 g 1  . gabapentin (NEURONTIN) 300 MG capsule Take 600 mg by mouth 2 (two) times daily.    . Multiple Vitamins-Minerals (PRESERVISION AREDS 2) CAPS Take 2 capsules  by mouth.    . sertraline (ZOLOFT) 100 MG tablet 1 1/2  Tab po qd    . simvastatin (ZOCOR) 20 MG tablet Take 20 mg by mouth every evening.    . SUMAtriptan (IMITREX) 100 MG tablet TAKE 1 TABLET BY MOUTH MAY REPEAT IN 2 HOURS ONE TIME IF NEEDED 10 tablet 1  . SYNTHROID 137 MCG tablet Take 1 tablet (137 mcg total) by mouth daily before breakfast. 30 tablet 2  . zolpidem (AMBIEN) 10 MG tablet Take 10 mg by mouth at bedtime as needed.     No current facility-administered medications on file prior to visit.   No Known Allergies Family History  Problem Relation Age of Onset  . Heart disease Mother     Angina  . Cancer Mother   . Cancer Father   . Heart disease Father   . Cancer Maternal Aunt    PE: BP 90/60 mmHg  Pulse 88  Ht 5\' 8"  (1.727 m)  Wt 209 lb (94.802 kg)  BMI 31.79 kg/m2  SpO2 95% Wt Readings from Last 3 Encounters:  11/20/15 209 lb (94.802 kg)  07/28/15 214 lb 6.4 oz (97.251 kg)  07/24/15 215 lb 12.8 oz (97.886 kg)   Constitutional: overweight, in NAD Eyes: PERRLA, EOMI, no exophthalmos ENT: moist mucous membranes, no thyromegaly, thyroid scar healed, no cervical lymphadenopathy Cardiovascular: RRR, No MRG Respiratory: CTA B Gastrointestinal: abdomen soft, NT, ND, BS+ Musculoskeletal: no deformities, strength intact in all 4 Skin: moist, warm, no rashes Neurological: no tremor with outstretched hands, DTR normal in all 4  ASSESSMENT: 1.  Postsurgical Hypothyroidism  2. Low BP  PLAN:  1. Patient with long-standing hypothyroidism, on levothyroxine therapy. She had fluctuating levels of TSH over time, with the last level being suppressed >> LT4 dose was decreased and changed to brand name. She is feeling better, but feels weaker today. Her BP is lower than usual (not on diuretics, beta blockers, CCBs).  - We discussed about correct intake of levothyroxine, fasting, with water, separated by at least 30 minutes from breakfast, and separated by more than 4 hours from  calcium, iron, multivitamins, acid reflux medications (PPIs). She is taking it correctly. - will check thyroid tests itoday on Synthroid 137 g daily: TSH, free T4 - I will see her back in 4 months  2. Low BP - advised her to increase hydration  - if these episodes of weakness and lower BP continue >> needs to check with PCP  Needs refills.  Component     Latest Ref Rng 11/20/2015  TSH     0.35 - 4.50 uIU/mL 0.24 (L)  T4,Free(Direct)     0.60 - 1.60 ng/dL 4.090.92  TSH is still suppressed, although greatly improved. We'll need to decrease the levothyroxine dose to 125 g daily. Since her  TSH is so close to normal, I will repeat her TFTs when she comes back in 4 months.

## 2015-11-20 NOTE — Telephone Encounter (Signed)
Left message for pt that PCP could send in a 50 mg dose of Zoloft to the pharmacy. Pt was asked to call back with questions about the pcp's dose change.

## 2015-11-21 NOTE — Telephone Encounter (Signed)
Letter mailed to pt to call and schedule a follow up.  

## 2015-11-24 ENCOUNTER — Telehealth: Payer: Self-pay

## 2015-11-24 MED ORDER — SYNTHROID 125 MCG PO TABS: 125.0000 ug | ORAL_TABLET | Freq: Every day | ORAL | Status: DC

## 2015-11-24 NOTE — Telephone Encounter (Signed)
Patient requesting a 6 month supply. Please advise

## 2015-11-24 NOTE — Telephone Encounter (Signed)
Called patient to notify of results and new medication sent into pharmacy. Left message on home phone per DPR release. Advised patient if they had questions to call back.

## 2015-11-25 NOTE — Telephone Encounter (Signed)
Spoke with pt and she voices understanding about the medication change and she has a follow up 01/06/16 with pcp.

## 2015-12-01 ENCOUNTER — Telehealth: Payer: Self-pay | Admitting: Medical

## 2015-12-01 NOTE — Telephone Encounter (Signed)
Please advise on refills.  

## 2015-12-01 NOTE — Telephone Encounter (Signed)
°  Relationship to patient: Self   Can be reached: [phone]   Pharmacy:  [facility] - [place], [place] - [address] [phone] (Phone) [phone])       Reason for call: Refill gabapentin (NEURONTIN) 300 MG capsule [[id number]][[id number]]   buPROPion (WELLBUTRIN XL) 150 MG 24 hr tablet [[id number]][[id number]]   zolpidem (AMBIEN) 10 MG tablet [[id number]][[id number]]   simvastatin (ZOCOR) 20 MG tablet [[id number]][[id number]]

## 2015-12-02 MED ORDER — ZOLPIDEM TARTRATE 10 MG PO TABS: 5.0000 mg | ORAL_TABLET | Freq: Every evening | ORAL | Status: DC | PRN

## 2015-12-02 MED ORDER — SIMVASTATIN 20 MG PO TABS: 20.0000 mg | ORAL_TABLET | Freq: Every evening | ORAL | Status: DC

## 2015-12-02 MED ORDER — BUPROPION HCL ER (XL) 150 MG PO TB24: 150.0000 mg | ORAL_TABLET | Freq: Every day | ORAL | Status: DC

## 2015-12-02 NOTE — Telephone Encounter (Signed)
Regarding her neurontin. This dose and sig she is on is quite high. Did neurologist prescribe this dose? If she has specialist who wrote the neurontin would prefer they fill.

## 2015-12-02 NOTE — Telephone Encounter (Signed)
I refilled her meds but have her make appointment for one month. Make in the morning and come in fasting for lipid check that day.

## 2015-12-03 NOTE — Telephone Encounter (Signed)
Appt scheduled with patient's husband for 7/26

## 2015-12-03 NOTE — Telephone Encounter (Signed)
Please call this patient and have her come in for a fasting appointment in a month.

## 2015-12-04 ENCOUNTER — Telehealth: Payer: Self-pay | Admitting: Medical

## 2015-12-04 NOTE — Telephone Encounter (Signed)
°  Relationship to patient: Self  Can be reached: [phone]   Pharmacy:  [facility] - [place], [place] - [address] [phone] (Phone) [phone])         Reason for call: States that her gabapentin (NEURONTIN) 300 MG capsule [[id number]][[id number]]  Was not sent in to the pharmacy

## 2015-12-04 NOTE — Telephone Encounter (Signed)
I need help. Confirm dose of neurontin. Sig. You can call in 2 weeks worth. But have her schedule follow up with me in 2 wks.

## 2015-12-04 NOTE — Telephone Encounter (Signed)
Please advise 

## 2015-12-05 MED ORDER — GABAPENTIN 300 MG PO CAPS: 600.0000 mg | ORAL_CAPSULE | Freq: Two times a day (BID) | ORAL | Status: DC

## 2015-12-05 NOTE — Telephone Encounter (Signed)
Left message for pt that Mallory Durham could send in a 30 day supply to the pharmacy and that she would need to keep the follow up in August. Pt was asked to call back with any questions.

## 2015-12-10 ENCOUNTER — Ambulatory Visit: Payer: Medicare Other | Admitting: Medical

## 2015-12-24 ENCOUNTER — Telehealth: Payer: Self-pay | Admitting: Medical

## 2015-12-24 NOTE — Telephone Encounter (Signed)
Relation to ZO:XWRUpt:self Call back number:[phone] Pharmacy: [facility] - [place],  - [address] [phone] (Phone) [phone])     Reason for call:  Patient requesting a few pills of gabapentin (NEURONTIN) 300 MG capsule to hold her over until appointment scheduled for 12/31/15. Please advise

## 2015-12-24 NOTE — Telephone Encounter (Signed)
Please advise 

## 2015-12-25 MED ORDER — GABAPENTIN 300 MG PO CAPS
600.0000 mg | ORAL_CAPSULE | Freq: Two times a day (BID) | ORAL | 0 refills | Status: DC
Start: 2015-12-25 — End: 2015-12-31

## 2015-12-25 NOTE — Telephone Encounter (Signed)
Will you see pt gabapentin request. She wants med until 01-01-2016. I thought we addressed this. Does she have appointment with neurologist coming up or with me. Will you go ahead and give her a refill. I would say enough for one month. Then review with neurlogist or with me if I am seeing her on 17th.

## 2015-12-31 ENCOUNTER — Encounter: Payer: Self-pay | Admitting: Medical

## 2015-12-31 ENCOUNTER — Ambulatory Visit (INDEPENDENT_AMBULATORY_CARE_PROVIDER_SITE_OTHER): Payer: Medicare Other | Admitting: Medical

## 2015-12-31 ENCOUNTER — Telehealth: Payer: Self-pay

## 2015-12-31 ENCOUNTER — Other Ambulatory Visit: Payer: Self-pay

## 2015-12-31 VITALS — BP 120/88 | HR 88 | Temp 98.8°F | Ht 68.0 in | Wt 208.2 lb

## 2015-12-31 DIAGNOSIS — M542 Cervicalgia: Secondary | ICD-10-CM | POA: Diagnosis not present

## 2015-12-31 DIAGNOSIS — F329 Major depressive disorder, single episode, unspecified: Secondary | ICD-10-CM

## 2015-12-31 DIAGNOSIS — M792 Neuralgia and neuritis, unspecified: Secondary | ICD-10-CM

## 2015-12-31 DIAGNOSIS — G4700 Insomnia, unspecified: Secondary | ICD-10-CM | POA: Diagnosis not present

## 2015-12-31 DIAGNOSIS — F32A Depression, unspecified: Secondary | ICD-10-CM

## 2015-12-31 MED ORDER — GABAPENTIN 300 MG PO CAPS: 600.0000 mg | ORAL_CAPSULE | Freq: Two times a day (BID) | ORAL | 4 refills | Status: DC

## 2015-12-31 MED ORDER — ZOLPIDEM TARTRATE 10 MG PO TABS: 10.0000 mg | ORAL_TABLET | Freq: Every evening | ORAL | 2 refills | Status: DC | PRN

## 2015-12-31 MED ORDER — SUMATRIPTAN SUCCINATE 100 MG PO TABS: ORAL_TABLET | ORAL | 1 refills | Status: DC

## 2015-12-31 MED ORDER — SERTRALINE HCL 100 MG PO TABS
100.0000 mg | ORAL_TABLET | Freq: Every day | ORAL | 3 refills | Status: DC
Start: 2015-12-31 — End: 2016-01-28

## 2015-12-31 NOTE — Telephone Encounter (Signed)
Patient came was concerned that her phone calls were not being answered in a timely manner and also that she cannot call and get a phone call from the nurse she calls and in return has the scheduler follow up with a call that cannot answer her questions. Another note has to be entered for the next question. She feels our process is broken and was considering look for another doctor for her and her spouse due to poor communication in this office.

## 2015-12-31 NOTE — Patient Instructions (Addendum)
For your neck pain/nerve pain will rx gabapentin and give refills.  For your depression will increase your sertaline today. With goal getting you to 200 mg a day.(rx advisement on serotonin syndrome) If pt has any of below symptoms then call Mallory Durham or be seen in ED.Pt expressed understanding.  For your insomnia will rx ambien.   Your office communication issues you could talk with Miguel DibbleEbony Martin or [place]Jordan Johnson.  Follow up in 3-6 months or as needed.   Serotonin Syndrome Serotonin is a brain chemical that regulates the nervous system, which includes the brain, spinal cord, and nerves. Serotonin appears to play a role in all types of behavior, including appetite, emotions, movement, thinking, and response to stress. Excessively high levels of serotonin in the body can cause serotonin syndrome, which is a very dangerous condition. CAUSES This condition can be caused by taking medicines or drugs that increase the level of serotonin in your body. These include:  Antidepressant medicines.  Migraine medicines.  Certain pain medicines.  Certain recreational drugs, including ecstasy, LSD, cocaine, and amphetamines.  Over-the-counter cough or cold medicines that contain dextromethorphan.  Certain herbal supplements, including St. John's wort, ginseng, and nutmeg. This condition usually occurs when you take these medicines or drugs in combination, but it can also happen with a high dose of a single medicine or drug. RISK FACTORS This condition is more likely to develop in:  People who have recently increased the dosage of medicine that increases the serotonin level.  People who just started taking medicine that increases the serotonin level. SYMPTOMS Symptoms of this condition usually happens within several hours of a medicine change. Symptoms include:  Headache.  Muscle twitching or stiffness.  Diarrhea.  Confusion.  Restlessness or agitation.  Shivering or goose bumps.  Loss of  muscle coordination.  Rapid heart rate.  Sweating. Severe cases of serotonin syndromecan cause:  Irregular heartbeat.  Seizures.  Loss of consciousness.  High fever. DIAGNOSIS This condition is diagnosed with a medical history and physical exam. You will be asked aboutyour symptoms and your use of medicines and recreational drugs. Your health care provider may also order lab work or additional tests to rule out other causes of your symptoms. TREATMENT The treatment for this condition depends on the severity of your symptoms. For mild cases, stopping the medicine that caused your condition is usually all that is needed. For moderate to severe cases, hospitalization is required to monitor you and to prevent further muscle damage. HOME CARE INSTRUCTIONS  Take over-the-counter and prescription medicines only as told by your health care provider. This is important.  Check with your health care provider before you start taking any new prescriptions, over-the-counter medicines, herbs, or supplements.  Avoid combining any medicines that can cause this condition to occur.  Keep all follow-up visits as told by your health care provider.This is important.  Maintain a healthy lifestyle.  Eat healthy foods.  Get plenty of sleep.  Exercise regularly.  Do not drink alcohol.  Do not use recreational drugs. SEEK MEDICAL CARE IF:  Medicines do not seem to be helping.  Your symptoms do not improve or they get worse.  You have trouble taking care of yourself. SEEK IMMEDIATE MEDICAL CARE IF:  You have worsening confusion, severe headache, chest pain, high fever, seizures, or loss of consciousness.  You have serious thoughts about hurting yourself or others.  You experience serious side effects of medicine, such as swelling of your face, lips, tongue, or throat.  This information is not intended to replace advice given to you by your health care provider. Make sure you discuss any  questions you have with your health care provider.   Document Released: 06/10/2004 Document Revised: 09/17/2014 Document Reviewed: 05/16/2014 Elsevier Interactive Patient Education [employer]2016 Elsevier Inc.

## 2015-12-31 NOTE — Progress Notes (Signed)
Pre visit review using our clinic tool,if applicable. No additional management support is needed unless otherwise documented below in the visit note.  

## 2015-12-31 NOTE — Progress Notes (Addendum)
Subjective:    Patient ID: Mallory Durham, female    DOB: 1941/04/28, 75 y.o.   MRN: [id number]  HPI   Pt in with neck pain that is severe. Pt has been seen by neurologist(She states Dr. Anne HahnWillis saw her in past). Pain can be severe. She has seen neurologist and current dose of gapapentin written. Pt upset since she had to come in frequently. But last seen in march. I think she has been 3 times total. She states no adverse side effects with gabapentin.  She states I cut her down on sertraline. Pt thinks she was on 200 mg in the past. Pt upset about high dose being decreased. She had been on that previously.   Pt is upset and expresses that she may leave the practice.  Pt also upset about medication ambien being cut back. She wants to be on one 10 mg tablet a day.     Review of Systems  Constitutional: Negative for chills and fatigue.  Respiratory: Negative for cough, chest tightness, shortness of breath and wheezing.   Cardiovascular: Negative for chest pain and palpitations.  Musculoskeletal: Negative for back pain.  Skin: Negative for rash.  Neurological: Negative for dizziness, weakness, numbness and headaches.  Psychiatric/Behavioral: Positive for agitation, dysphoric mood and sleep disturbance. Negative for confusion and suicidal ideas. The patient is nervous/anxious.     Past Medical History:  Diagnosis Date  . Anxiety   . Confusion, hx of, without neuro findings   . Depression   . Hx of vertigo   . Hyperlipidemia   . Hypothyroid   . Left shoulder pain   . Migraines   . Neck pain   . Orthostatic hypotension   . Renal insufficiency      Social History   Social History  . Marital status: Married    Spouse name: N/A  . Number of children: 2  . Years of education: N/A   Occupational History  .  Retired   Social History Main Topics  . Smoking status: Never Smoker  . Smokeless tobacco: Never Used  . Alcohol use No  . Drug use: No  . Sexual activity: Not on  file   Other Topics Concern  . Not on file   Social History Narrative  . No narrative on file    Past Surgical History:  Procedure Laterality Date  . ABDOMINAL HYSTERECTOMY    . ABDOMINAL HYSTERECTOMY    . BREAST LUMPECTOMY    . CHOLECYSTECTOMY    . JOINT REPLACEMENT    . THYROID SURGERY    . TONSILLECTOMY      Family History  Problem Relation Age of Onset  . Heart disease Mother     Angina  . Cancer Mother   . Cancer Father   . Heart disease Father   . Cancer Maternal Aunt     No Known Allergies  Current Outpatient Prescriptions on File Prior to Visit  Medication Sig Dispense Refill  . buPROPion (WELLBUTRIN XL) 150 MG 24 hr tablet Take 1 tablet (150 mg total) by mouth daily. 30 tablet 1  . divalproex (DEPAKOTE) 500 MG DR tablet Take 500 mg by mouth daily.    Marland Kitchen. gabapentin (NEURONTIN) 300 MG capsule Take 2 capsules (600 mg total) by mouth 2 (two) times daily. 16 capsule 0  . sertraline (ZOLOFT) 50 MG tablet Take 1 tablet (50 mg total) by mouth daily. 30 tablet 1  . simvastatin (ZOCOR) 20 MG tablet Take 1 tablet (20 mg total)  by mouth every evening. 30 tablet 1  . SYNTHROID 125 MCG tablet Take 1 tablet (125 mcg total) by mouth daily before breakfast. 90 tablet 1  . zolpidem (AMBIEN) 10 MG tablet Take 0.5 tablets (5 mg total) by mouth at bedtime as needed. 30 tablet 1  . fluticasone (FLONASE) 50 MCG/ACT nasal spray Place 2 sprays into both nostrils daily. (Patient not taking: Reported on 12/31/2015) 16 g 1  . Multiple Vitamins-Minerals (PRESERVISION AREDS 2) CAPS Take 2 capsules by mouth.     No current facility-administered medications on file prior to visit.     BP 120/88   Pulse 88   Temp 98.8 F (37.1 C) (Oral)   Ht 5\' 8"  (1.727 m)   Wt 208 lb 3.2 oz (94.4 kg)   SpO2 100%   BMI 31.66 kg/m       Objective:   Physical Exam  General Mental Status- Alert. General Appearance- Not in acute distress.   Skin General: Color- Normal Color. Moisture- Normal  Moisture.  Neck Carotid Arteries- Normal color. Moisture- Normal Moisture. No carotid bruits. No JVD.  Chest and Lung Exam Auscultation: Breath Sounds:-Normal.  Cardiovascular Auscultation:Rythm- Regular. Murmurs & Other Heart Sounds:Auscultation of the heart reveals- No Murmurs.  Abdomen Inspection:-Inspeection Normal. Palpation/Percussion:Note:No mass. Palpation and Percussion of the abdomen reveal- Non Tender, Non Distended + BS, no rebound or guarding.    Neurologic Cranial Nerve exam:- CN III-XII intact(No nystagmus), symmetric smile. Strength:- 5/5 equal and symmetric strength both upper and lower extremities.      Assessment & Plan:  For your neck pain/nerve pain will rx gabapentin and give refills.  For your depression will increase your sertaline today. With goal getting you to 200 mg a day.(rx advisement on serotonin syndrome)  For your insomnia will rx ambien.  Pt was very upset on my dosing of meds.   I reviewed pt chart what I think what may have happened was that when I went to fill her 100 mg request for sertraline that warning for serotonin syndrome came up. Pt has been seen here before but we never actually discussed her depression history. She was seen for other acute problems. So I gave her limited supply and reduced dose until I could give rx advisement and make her aware of potential med side effects.   Regarding her Remus Loffler. I wrote lower dose due to age. She was upset about this as well stating she needs higher dose.  I did introduced her to Miguel Dibble so she could voice her concerns regarding communication problems getting through to our office.  Follow up in 3-6 months or as needed.  Chantal Worthey, Ramon Dredge, PA-C    Below  placed on 02-01-2016 2:15 pm.  I saw pt messages this past week and tried to send scripts fast/same day. Regarding pt pharmacy not delivering her meds. I was under impression that pt had commuinicated to her pharmacy that she wanted  med delivered.   Pt has request to get 6 months of meds. Typically at most I write 90 days with one refill. In her case this could apply to wellbutrin, depakote, flonase, neurtontin sertraline, simivastatin. But with psychiatric meds prefer to give one month worth with refills.(Also need depakote level on pt)  However I can't write ambien for only one month at a time(since controlled med she would need contract and urine screen). Synthroid should be responsibility of endocrinologist and imitrex most insurance company only give 10 tabs at a time(I never heard of them authorizing  60 tabs).   I called pt this weekend/sunday  to convey above message. No answer on her cell or home phone. I went ahead and gave her my cell phone number. I asked her to call me back today and she could call other times she needs me in light of her complaint of extreme long wait times on our phone system.  Note also I would prefer to give pt refills on her meds and have her pharmacy deliver each month.

## 2016-01-06 ENCOUNTER — Ambulatory Visit: Payer: Medicare Other | Admitting: Medical

## 2016-01-28 ENCOUNTER — Other Ambulatory Visit: Payer: Self-pay

## 2016-01-28 ENCOUNTER — Telehealth: Payer: Self-pay

## 2016-01-28 DIAGNOSIS — Z5181 Encounter for therapeutic drug level monitoring: Secondary | ICD-10-CM

## 2016-01-28 MED ORDER — SERTRALINE HCL 100 MG PO TABS: 100.0000 mg | ORAL_TABLET | Freq: Every day | ORAL | 3 refills | Status: DC

## 2016-01-28 MED ORDER — SIMVASTATIN 20 MG PO TABS: 20.0000 mg | ORAL_TABLET | Freq: Every evening | ORAL | 1 refills | Status: DC

## 2016-01-28 MED ORDER — ZOLPIDEM TARTRATE 10 MG PO TABS: 5.0000 mg | ORAL_TABLET | Freq: Every evening | ORAL | 1 refills | Status: DC | PRN

## 2016-01-28 MED ORDER — BUPROPION HCL ER (XL) 150 MG PO TB24: 150.0000 mg | ORAL_TABLET | Freq: Every day | ORAL | 1 refills | Status: DC

## 2016-01-28 NOTE — Telephone Encounter (Signed)
Patient called upset that her medications were never received by the pharmacy ([facility]) I told her the system is showing 2 of the medications had refills at the pharmacy. She needs Divalproex 500 mg, sumatriptan, and synthroid all sent to [facility] for her to pick up asap. Patient's call back is [phone]   Patient also complained about our process said she is not happy with how we do things here. She said her medications should have been filled by now and the front office was rude to her by not promising her she would get a call back to day about these medications. I apologized for for her experience and informed her of the medication refill policy. I then got nurse on the phone to assist with her needs.

## 2016-01-29 MED ORDER — DIVALPROEX SODIUM 500 MG PO DR TAB: 500.0000 mg | DELAYED_RELEASE_TABLET | Freq: Every day | ORAL | 3 refills | Status: DC

## 2016-01-29 MED ORDER — SYNTHROID 125 MCG PO TABS: 125.0000 ug | ORAL_TABLET | Freq: Every day | ORAL | 0 refills | Status: DC

## 2016-01-29 MED ORDER — SUMATRIPTAN SUCCINATE 100 MG PO TABS: ORAL_TABLET | ORAL | 2 refills | Status: DC

## 2016-01-29 NOTE — Telephone Encounter (Addendum)
Called [facility].  All Rx's have been received.  Ambien---last filled on 01/26/16 and there is one refill left.  Called patient and made her aware.  She says that she is extremely frustrated with her experience thus far.  She says that she received a phone call from [facility] saying that her prescriptions were ready for pick up.  She says that her medications are suppose to be delivered.  She lives at an independent living facility and she would like to have medications delivered.  She also says that she would like a  6 month supply of all the medications sent in for both her and her husband (see husband's chart).  She says that she does not want to have to go through this every other month.  Called pharmacy and they said they would switch over to delivery for the patient and her husband.  Pt is aware.  Lab appointment scheduled.   Edward please advise on 6 month supply for patient.

## 2016-01-29 NOTE — Telephone Encounter (Signed)
Mallory Durham. Need some help with this patient.   Make sure synthroid. depakote and imitrex and received.  Is she due for ambien. If so then I would refill.   Call pt and ask her to get depakote level within next month.

## 2016-01-29 NOTE — Telephone Encounter (Signed)
Spoke with patiet  regarding refills.

## 2016-01-30 ENCOUNTER — Telehealth: Payer: Self-pay

## 2016-01-30 ENCOUNTER — Telehealth: Payer: Self-pay | Admitting: Medical

## 2016-01-30 NOTE — Telephone Encounter (Signed)
Called patient back to get more information this is concerning her spouse will document in his chart

## 2016-01-30 NOTE — Telephone Encounter (Signed)
Pt called in and requested to speak with either Karel JarvisEbony or Clydie BraunKaren. Advised that both are not currently available, pt would like one of the young ladies to give her a call back.    Phone # [phone]

## 2016-01-30 NOTE — Telephone Encounter (Signed)
atient called in requesting 6 month supply of medications prescribed earlier. If ok I can call; in meds that were sent over and cancel refills on others

## 2016-02-01 MED ORDER — BUPROPION HCL ER (XL) 150 MG PO TB24: 150.0000 mg | ORAL_TABLET | Freq: Every day | ORAL | 1 refills | Status: DC

## 2016-02-01 MED ORDER — SIMVASTATIN 20 MG PO TABS: 20.0000 mg | ORAL_TABLET | Freq: Every evening | ORAL | 1 refills | Status: DC

## 2016-02-01 MED ORDER — BUPROPION HCL ER (XL) 150 MG PO TB24: 150.0000 mg | ORAL_TABLET | Freq: Every day | ORAL | 5 refills | Status: DC

## 2016-02-01 MED ORDER — SUMATRIPTAN SUCCINATE 100 MG PO TABS: ORAL_TABLET | ORAL | 5 refills | Status: DC

## 2016-02-01 MED ORDER — SIMVASTATIN 20 MG PO TABS: 20.0000 mg | ORAL_TABLET | Freq: Every day | ORAL | 5 refills | Status: DC

## 2016-02-01 MED ORDER — GABAPENTIN 300 MG PO CAPS: 600.0000 mg | ORAL_CAPSULE | Freq: Two times a day (BID) | ORAL | 5 refills | Status: DC

## 2016-02-01 NOTE — Telephone Encounter (Signed)
I did talk with pt. She will come in end of sept for depakote level, sign controlled med contract and give drug screen. Will plan to refill her meds on montly basis but give refill to last full 6 months. Pt will follow up February.

## 2016-02-01 NOTE — Telephone Encounter (Signed)
I printed out future order imitirex last night. Will you get me to sign and I then fax it over to her pharmacy. Also I want to clarify that pt wants me to go all the way up to 200 mg on her sertraline. I believe she state she was on that dose in the past. Just want to check since that is the max dose. Let me know then I can sent that future order in when she runs out of current rx. If she does want 200 mg let me know. I could not find 200 mg dose sertraline   Also offer pt flu vaccine end of September when she is come in for depakote level.  The day she comes in for depakote level she will also give uds. I want her to sign controlled med that day as well. So will you let lab know she will get uds that day. But will you get her to sign contract before she gives uds.

## 2016-02-01 NOTE — Telephone Encounter (Signed)
I saw pt messages this past week and tried to send scripts fast/same day. Regarding pt pharmacy not delivering her meds. I was under impression that pt had commuinicated to her pharmacy that she wanted med delivered.   Pt has request to get 6 months of meds. Typically at most I write 90 days with one refill. In her case this could apply to wellbutrin, depakote, flonase, neurtontin sertraline, simivastatin. But with psychiatric meds prefer to give one month worth with refills.(Also need depakote level on pt)  However I can't write ambien for only one month at a time(since controlled med she would need contract and urine screen). Synthroid should be responsibility of endocrinologist and imitrex most insurance company only give 10 tabs at a time(I never heard of them authorizing 60 tabs).   I called pt this weekend/sunday  to convey above message. No answer on her cell or home phone. I went ahead and gave her my cell phone number. I asked her to call me back today and she could call other times she needs me in light of her complaint of extreme long wait times on our phone system.  Note also I would prefer to give pt refills on her meds and have her pharmacy deliver each month.   I will to paste above to her last note as well.

## 2016-02-01 NOTE — Addendum Note (Signed)
Addended by: Gwenevere AbbotSAGUIER, Valor Quaintance M on: 02/01/2016 09:46 PM   Modules accepted: Orders

## 2016-02-12 ENCOUNTER — Other Ambulatory Visit (INDEPENDENT_AMBULATORY_CARE_PROVIDER_SITE_OTHER): Payer: Medicare Other

## 2016-02-12 ENCOUNTER — Ambulatory Visit (INDEPENDENT_AMBULATORY_CARE_PROVIDER_SITE_OTHER): Payer: Medicare Other | Admitting: Behavioral Health

## 2016-02-12 DIAGNOSIS — Z5181 Encounter for therapeutic drug level monitoring: Secondary | ICD-10-CM | POA: Diagnosis not present

## 2016-02-12 DIAGNOSIS — Z23 Encounter for immunization: Secondary | ICD-10-CM

## 2016-02-12 NOTE — Progress Notes (Signed)
Pre visit review using our clinic review tool, if applicable. No additional management support is needed unless otherwise documented below in the visit note.  Patient in clinic today for Influenza vaccination. IM given in Left Deltoid. Patient tolerated injection well. 

## 2016-02-13 LAB — VALPROIC ACID LEVEL: VALPROIC ACID LVL: 46 ug/mL — AB (ref 50.0–100.0)

## 2016-02-20 ENCOUNTER — Ambulatory Visit (INDEPENDENT_AMBULATORY_CARE_PROVIDER_SITE_OTHER): Payer: Medicare Other | Admitting: Family Medicine

## 2016-02-20 ENCOUNTER — Encounter: Payer: Self-pay | Admitting: Family Medicine

## 2016-02-20 VITALS — BP 120/82 | HR 81 | Temp 98.3°F | Ht 67.0 in | Wt 209.1 lb

## 2016-02-20 DIAGNOSIS — R300 Dysuria: Secondary | ICD-10-CM

## 2016-02-20 DIAGNOSIS — R350 Frequency of micturition: Secondary | ICD-10-CM | POA: Diagnosis not present

## 2016-02-20 DIAGNOSIS — N3001 Acute cystitis with hematuria: Secondary | ICD-10-CM

## 2016-02-20 DIAGNOSIS — R6883 Chills (without fever): Secondary | ICD-10-CM | POA: Diagnosis not present

## 2016-02-20 LAB — POCT URINALYSIS DIPSTICK
Bilirubin, UA: NEGATIVE
Glucose, UA: NEGATIVE
Ketones, UA: NEGATIVE
NITRITE UA: NEGATIVE
PROTEIN UA: POSITIVE
RBC UA: POSITIVE
SPEC GRAV UA: 1.02
UROBILINOGEN UA: 4
pH, UA: 6

## 2016-02-20 MED ORDER — CEFDINIR 300 MG PO CAPS: 300.0000 mg | ORAL_CAPSULE | Freq: Two times a day (BID) | ORAL | 0 refills | Status: AC

## 2016-02-20 MED ORDER — ZOLPIDEM TARTRATE 10 MG PO TABS: 5.0000 mg | ORAL_TABLET | Freq: Every evening | ORAL | 1 refills | Status: DC | PRN

## 2016-02-20 MED ORDER — SERTRALINE HCL 100 MG PO TABS: 150.0000 mg | ORAL_TABLET | Freq: Every day | ORAL | 0 refills | Status: DC

## 2016-02-20 NOTE — Patient Instructions (Signed)
Consider a fiber supplement such as Benefiber, Citracel, metamucil once to twice  Urinary Tract Infection Urinary tract infections (UTIs) can develop anywhere along your urinary tract. Your urinary tract is your body's drainage system for removing wastes and extra water. Your urinary tract includes two kidneys, two ureters, a bladder, and a urethra. Your kidneys are a pair of bean-shaped organs. Each kidney is about the size of your fist. They are located below your ribs, one on each side of your spine. CAUSES Infections are caused by microbes, which are microscopic organisms, including fungi, viruses, and bacteria. These organisms are so small that they can only be seen through a microscope. Bacteria are the microbes that most commonly cause UTIs. SYMPTOMS  Symptoms of UTIs may vary by age and gender of the patient and by the location of the infection. Symptoms in young women typically include a frequent and intense urge to urinate and a painful, burning feeling in the bladder or urethra during urination. Older women and men are more likely to be tired, shaky, and weak and have muscle aches and abdominal pain. A fever may mean the infection is in your kidneys. Other symptoms of a kidney infection include pain in your back or sides below the ribs, nausea, and vomiting. DIAGNOSIS To diagnose a UTI, your caregiver will ask you about your symptoms. Your caregiver will also ask you to provide a urine sample. The urine sample will be tested for bacteria and white blood cells. White blood cells are made by your body to help fight infection. TREATMENT  Typically, UTIs can be treated with medication. Because most UTIs are caused by a bacterial infection, they usually can be treated with the use of antibiotics. The choice of antibiotic and length of treatment depend on your symptoms and the type of bacteria causing your infection. HOME CARE INSTRUCTIONS  If you were prescribed antibiotics, take them exactly as  your caregiver instructs you. Finish the medication even if you feel better after you have only taken some of the medication.  Drink enough water and fluids to keep your urine clear or pale yellow.  Avoid caffeine, tea, and carbonated beverages. They tend to irritate your bladder.  Empty your bladder often. Avoid holding urine for long periods of time.  Empty your bladder before and after sexual intercourse.  After a bowel movement, women should cleanse from front to back. Use each tissue only once. SEEK MEDICAL CARE IF:   You have back pain.  You develop a fever.  Your symptoms do not begin to resolve within 3 days. SEEK IMMEDIATE MEDICAL CARE IF:   You have severe back pain or lower abdominal pain.  You develop chills.  You have nausea or vomiting.  You have continued burning or discomfort with urination. MAKE SURE YOU:   Understand these instructions.  Will watch your condition.  Will get help right away if you are not doing well or get worse.   This information is not intended to replace advice given to you by your health care provider. Make sure you discuss any questions you have with your health care provider.   Document Released: 02/10/2005 Document Revised: 01/22/2015 Document Reviewed: 06/11/2011 Elsevier Interactive Patient Education [employer]2016 Elsevier Inc.

## 2016-02-20 NOTE — Progress Notes (Signed)
Pre visit review using our clinic review tool, if applicable. No additional management support is needed unless otherwise documented below in the visit note. 

## 2016-02-21 NOTE — Progress Notes (Signed)
ZOX:WRUEAVW, Ramon Dredge, PA-C Chief Complaint  Patient presents with  . Dysuria  . Urinary Frequency  . Chills    Current Issues:  Presents with 1-2 days of dysuria Associated symptoms include:  chills, urinary frequency and urgency  There is no history of of similar symptoms. Sexually active:  No   No concern for STI.  Prior to Admission medications   Medication Sig Start Date End Date Taking? Authorizing Provider  buPROPion (WELLBUTRIN XL) 150 MG 24 hr tablet Take 1 tablet (150 mg total) by mouth daily. 02/01/16  Yes Edward Saguier, PA-C  divalproex (DEPAKOTE) 500 MG DR tablet Take 1 tablet (500 mg total) by mouth daily. 01/29/16  Yes Edward Saguier, PA-C  fluticasone (FLONASE) 50 MCG/ACT nasal spray Place 2 sprays into both nostrils daily. 05/29/15  Yes Edward Saguier, PA-C  gabapentin (NEURONTIN) 300 MG capsule Take 2 capsules (600 mg total) by mouth 2 (two) times daily. 02/01/16  Yes Edward Saguier, PA-C  Multiple Vitamins-Minerals (PRESERVISION AREDS 2) CAPS Take 2 capsules by mouth.   Yes Historical Provider, MD  sertraline (ZOLOFT) 100 MG tablet Take 1.5 tablets (150 mg total) by mouth daily. 02/20/16  Yes Bradd Canary, MD  simvastatin (ZOCOR) 20 MG tablet Take 1 tablet (20 mg total) by mouth every evening. 02/01/16  Yes Edward Saguier, PA-C  SUMAtriptan (IMITREX) 100 MG tablet TAKE 1 TABLET BY MOUTH MAY REPEAT IN 2 HOURS ONE TIME IF NEEDED 02/01/16  Yes Esperanza Richters, PA-C  SYNTHROID 125 MCG tablet Take 1 tablet (125 mcg total) by mouth daily before breakfast. 01/29/16  Yes Esperanza Richters, PA-C  zolpidem (AMBIEN) 10 MG tablet Take 0.5-1 tablets (5-10 mg total) by mouth at bedtime as needed. 02/20/16  Yes Bradd Canary, MD  cefdinir (OMNICEF) 300 MG capsule Take 1 capsule (300 mg total) by mouth 2 (two) times daily. 02/20/16 03/01/16  Bradd Canary, MD    Review of Systems: Review of Systems  Constitutional: Positive for chills. Negative for fever and malaise/fatigue.  HENT: Negative  for congestion.   Eyes: Negative for blurred vision.  Respiratory: Negative for shortness of breath.   Cardiovascular: Negative for chest pain, palpitations and leg swelling.  Gastrointestinal: Negative for abdominal pain, blood in stool and nausea.  Genitourinary: Positive for dysuria, frequency and urgency. Negative for flank pain and hematuria.  Musculoskeletal: Negative for falls and myalgias.  Skin: Negative for rash.  Neurological: Negative for dizziness, loss of consciousness, weakness and headaches.  Endo/Heme/Allergies: Negative for environmental allergies.  Psychiatric/Behavioral: Negative for depression. The patient is not nervous/anxious.     PE:  BP 120/82 (BP Location: Left Arm, Patient Position: Sitting, Cuff Size: Large)   Pulse 81   Temp 98.3 F (36.8 C) (Oral)   Ht 5\' 7"  (1.702 m)   Wt 209 lb 2 oz (94.9 kg)   SpO2 97%   BMI 32.75 kg/m  Constitutionalno distress. WNWD W female HeartRRR LungsCTA b/l Back: no pain with palpation over flanks Abdomen NTND, no masses, positive BS  Results for orders placed or performed in visit on 02/20/16  POCT Urinalysis Dipstick  Result Value Ref Range   Color, UA yellow    Clarity, UA clear    Glucose, UA negative    Bilirubin, UA negative    Ketones, UA negative    Spec Grav, UA 1.020    Blood, UA positive 10    pH, UA 6.0    Protein, UA positive 0.15    Urobilinogen, UA 4.0  Nitrite, UA negative    Leukocytes, UA large (3+) (A) Negative    Assessment and Plan:  1. Dysuria Started on Cefdinir, probiotics and increase hydration - POCT Urinalysis Dipstick - Urine Culture  2. Frequency of urination   - POCT Urinalysis Dipstick - Urine Culture  3. Chills  - POCT Urinalysis Dipstick - Urine Culture

## 2016-02-23 LAB — URINE CULTURE

## 2016-03-22 ENCOUNTER — Encounter: Payer: Self-pay | Admitting: Internal Medicine

## 2016-03-22 ENCOUNTER — Ambulatory Visit (INDEPENDENT_AMBULATORY_CARE_PROVIDER_SITE_OTHER): Payer: Medicare Other | Admitting: Internal Medicine

## 2016-03-22 VITALS — BP 122/84 | HR 74 | Ht 67.0 in | Wt 213.0 lb

## 2016-03-22 DIAGNOSIS — E890 Postprocedural hypothyroidism: Secondary | ICD-10-CM | POA: Diagnosis not present

## 2016-03-22 LAB — TSH: TSH: 0.37 u[IU]/mL (ref 0.35–4.50)

## 2016-03-22 LAB — T4, FREE: Free T4: 1.22 ng/dL (ref 0.60–1.60)

## 2016-03-22 MED ORDER — SYNTHROID 125 MCG PO TABS: 125.0000 ug | ORAL_TABLET | Freq: Every day | ORAL | 1 refills | Status: DC

## 2016-03-22 NOTE — Patient Instructions (Signed)
Please stop at the lab.  Please continue Synthroid 125 mcg daily.  Take the thyroid hormone every day, with water, at least 30 minutes before breakfast, separated by at least 4 hours from: - acid reflux medications - calcium - iron - multivitamins  Please come back for a follow-up appointment in 6 months.   

## 2016-03-22 NOTE — Progress Notes (Signed)
Patient ID: Mallory Durham, female   DOB: [birth year], 75 y.o.   MRN: [id number]   HPI  Mallory Durham is a 75 y.o.-year-old female, returning for f/u for postsurgical hypothyroidism. Last visit 4 mo ago.  She feels weak today and BP is lower today.   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); was on Levothyroxine 175 >> Synthroid DAW 137 mcg (in 08/2015) >> 125 mcg (in 11/2015), taken: - fasting - with water - separated by >30 min from b'fast  - + calcium - Tums at night (occasionally) - no iron - + PPIs OTC at night (occasionally) - + AREDs 2 (no Ca or iron) 2x a day, first dose in am, with b'fast  I reviewed pt's thyroid tests:  Lab Results  Component Value Date   TSH 0.24 (L) 11/20/2015   TSH 0.03 (L) 08/26/2015   TSH 0.17 (L) 06/05/2015   FREET4 0.92 11/20/2015   FREET4 1.44 08/26/2015  03/31/2015: TSH 6.82, fT4 1.24 01/22/2015: TSH 0.420 11/28/2014: TSH 5.440  TPO ABs per [facility] records - ? Why checked since pt had thyroidectomy in 2000...:  03/31/2015: TPO Abs <6 10/03/2013: TPO Abs 7 07/02/2010: TPO Abs <6  Pt describes: - + weight gain - no fatigue, no weakness  - no cold intolerance - no constipation - no dry skin - no hair loss  Pt denies feeling nodules in neck, hoarseness,  she does have occasional dysphagia/no odynophagia,  no SOB with lying down.  She has + FH of thyroid disorders in mother and sister: hypothyroidism. No FH of thyroid cancer.  No h/o radiation tx to head or neck. No recent use of iodine supplements.  She also has a history of hysterectomy in 1994. Also, HL, GERD, depression, ruptured Achilles tendon 2015.  ROS: Constitutional: + weight gain, no fatigue Eyes: no blurry vision, no xerophthalmia ENT: no sore throat, no nodules palpated in throat, no dysphagia/no odynophagia, no hoarseness Cardiovascular: no CP/SOB/palpitations/leg  swelling Respiratory: no cough/SOB Gastrointestinal: No N/V/D/C, + heartburn Musculoskeletal: + muscle aches/no joint aches Skin: no rashes, no hair loss Neurological: no tremors/numbness/tingling/dizziness  I reviewed pt's medications, allergies, PMH, social hx, family hx, and changes were documented in the history of present illness. Otherwise, unchanged from my initial visit note.  Past Medical History:  Diagnosis Date  . Anxiety   . Confusion, hx of, without neuro findings   . Depression   . Hx of vertigo   . Hyperlipidemia   . Hypothyroid   . Left shoulder pain   . Migraines   . Neck pain   . Orthostatic hypotension   . Renal insufficiency    Past Surgical History:  Procedure Laterality Date  . ABDOMINAL HYSTERECTOMY    . ABDOMINAL HYSTERECTOMY    . BREAST LUMPECTOMY    . CHOLECYSTECTOMY    . JOINT REPLACEMENT    . THYROID SURGERY    . TONSILLECTOMY     Social History   Social History  . Marital status: Married    Spouse name: N/A  . Number of children: 2   Occupational History  .  Retired   Social History Main Topics  . Smoking status: Never Smoker  . Smokeless tobacco: Never Used  . Alcohol use No  . Drug use: No   Current Outpatient Prescriptions on File Prior to Visit  Medication Sig Dispense Refill  . buPROPion (WELLBUTRIN XL) 150 MG 24 hr tablet Take  1 tablet (150 mg total) by mouth daily. 30 tablet 5  . divalproex (DEPAKOTE) 500 MG DR tablet Take 1 tablet (500 mg total) by mouth daily. 30 tablet 3  . gabapentin (NEURONTIN) 300 MG capsule Take 2 capsules (600 mg total) by mouth 2 (two) times daily. 120 capsule 5  . Multiple Vitamins-Minerals (PRESERVISION AREDS 2) CAPS Take 2 capsules by mouth.    . sertraline (ZOLOFT) 100 MG tablet Take 1.5 tablets (150 mg total) by mouth daily.  0  . simvastatin (ZOCOR) 20 MG tablet Take 1 tablet (20 mg total) by mouth every evening. 30 tablet 1  . SUMAtriptan (IMITREX) 100 MG tablet TAKE 1 TABLET BY MOUTH MAY  REPEAT IN 2 HOURS ONE TIME IF NEEDED 10 tablet 5  . SYNTHROID 125 MCG tablet Take 1 tablet (125 mcg total) by mouth daily before breakfast. 90 tablet 0  . zolpidem (AMBIEN) 10 MG tablet Take 0.5-1 tablets (5-10 mg total) by mouth at bedtime as needed. 30 tablet 1  . fluticasone (FLONASE) 50 MCG/ACT nasal spray Place 2 sprays into both nostrils daily. (Patient not taking: Reported on 03/22/2016) 16 g 1   No current facility-administered medications on file prior to visit.    No Known Allergies Family History  Problem Relation Age of Onset  . Heart disease Mother     Angina  . Cancer Mother   . Cancer Father   . Heart disease Father   . Cancer Maternal Aunt    PE: BP 122/84 (BP Location: Left Arm, Patient Position: Sitting)   Pulse 74   Ht 5\' 7"  (1.702 m)   Wt 213 lb (96.6 kg)   BMI 33.36 kg/m  Wt Readings from Last 3 Encounters:  03/22/16 213 lb (96.6 kg)  02/20/16 209 lb 2 oz (94.9 kg)  12/31/15 208 lb 3.2 oz (94.4 kg)   Constitutional: overweight, in NAD Eyes: PERRLA, EOMI, no exophthalmos ENT: moist mucous membranes, no thyromegaly, thyroid scar healed, no cervical lymphadenopathy Cardiovascular: RRR, No MRG Respiratory: CTA B Gastrointestinal: abdomen soft, NT, ND, BS+ Musculoskeletal: no deformities, strength intact in all 4 Skin: moist, warm, no rashes Neurological: no tremor with outstretched hands, DTR normal in all 4  ASSESSMENT: 1.  Postsurgical Hypothyroidism  PLAN:  1. Patient with long-standing hypothyroidism, on levothyroxine therapy (dose decreased to 125 mcg at last visit). She had fluctuating levels of TSH over time, with the last 3 levels being suppressed.She is feeling better after the decrease in dose. - We discussed about correct intake of levothyroxine, fasting, with water, separated by at least 30 minutes from breakfast, and separated by more than 4 hours from calcium, iron, multivitamins, acid reflux medications (PPIs). She is taking it correctly. -  will check thyroid tests today on Synthroid 125 g daily: TSH, free T4 - I will see her back in 6 months  Needs refills.  Office Visit on 03/22/2016  Component Date Value Ref Range Status  . TSH 03/22/2016 0.37  0.35 - 4.50 uIU/mL Final  . Free T4 03/22/2016 1.22  0.60 - 1.60 ng/dL Final   Comment: Specimens from patients who are undergoing biotin therapy and /or ingesting biotin supplements may contain high levels of biotin.  The higher biotin concentration in these specimens interferes with this Free T4 assay.  Specimens that contain high levels  of biotin may cause false high results for this Free T4 assay.  Please interpret results in light of the total clinical presentation of the patient.  Thyroid tests now normal, we can continue with Synthroid 125 g daily.  Carlus Pavlovristina Errica Dutil, MD PhD [facility]

## 2016-03-23 ENCOUNTER — Telehealth: Payer: Self-pay

## 2016-03-23 NOTE — Telephone Encounter (Signed)
Called and left message for patient advising of lab results, and the rx we had sent in for her. Gave call back number to office if any questions.

## 2016-03-29 ENCOUNTER — Telehealth: Payer: Self-pay

## 2016-03-29 MED ORDER — ZOLPIDEM TARTRATE 10 MG PO TABS: 5.0000 mg | ORAL_TABLET | Freq: Every evening | ORAL | 3 refills | Status: DC | PRN

## 2016-03-29 NOTE — Telephone Encounter (Signed)
Please advise on Ambien refill.  

## 2016-03-29 NOTE — Telephone Encounter (Signed)
I printed rx refill of her ambien. But she needs to give a drug screen. I believe I had asked her to have one done in past.   I had asked her to sign a contract also. Will you see if she did both. Not sure if both done.  Also she needs to have standard full drug panel. Recently one drug test done and only tested for one drug. So need to talk to lab. And and ask how to order prior full panel. Recent new drug screen company has changed some things?  I am not going to sign the rx until I make sure [place] laws are followed.

## 2016-03-30 NOTE — Telephone Encounter (Signed)
I printed a rx of ambien from home on Monday night. Was going to sign the rx on Tuesday morning. I did not see the rx on printer. And holden did not see the rx either. Controlled med so I don't want to refill without knowing what happened to the rx. Would you mind investigating this. Has this happened to anyone else.   You have talked with this pt before and she may be calling asking about rx.   Did anyone fax this in without me signing the rx?

## 2016-03-31 MED ORDER — ZOLPIDEM TARTRATE 10 MG PO TABS: 5.0000 mg | ORAL_TABLET | Freq: Every evening | ORAL | 3 refills | Status: DC | PRN

## 2016-03-31 NOTE — Telephone Encounter (Addendum)
Rx did not print.  Reprinted for provider to review and sign.  CSC attached.

## 2016-03-31 NOTE — Telephone Encounter (Signed)
Called patient and left a message for call back.  Prescription placed up front for pick up.

## 2016-03-31 NOTE — Addendum Note (Signed)
Addended by: Tylene FantasiaLANGSTON, Eliese Kerwood S on: 03/31/2016 03:14 PM   Modules accepted: Orders

## 2016-04-02 NOTE — Telephone Encounter (Signed)
Patient states that this medication is supposed to be delivered to her home address however she would not let me confirm the address. She just stated that they write deliver on the medication and they know where to deliver it.

## 2016-04-05 NOTE — Telephone Encounter (Signed)
Rx phoned in to pharmacy. Pharmacist states they will deliver medication to her house.  Asked pharmacist that medication be placed on hold. Called patient to make her aware and to ask that she come in to sign CSC.  Left a message for call back.

## 2016-04-05 NOTE — Telephone Encounter (Signed)
I informed patient that her prescription is at the pharmacy but is on hold until she comes in to sign the CSC. I let her know that she could come in at her convenience to sign the contract and then the hold would release from the pharmacy and the script would be sent to her home.

## 2016-04-12 NOTE — Telephone Encounter (Addendum)
Pt returned call.  Pt states she has not come in to complete CSC, but has received a refill on her Ambien. She says that she only has a one month supply and will come in at her convenience to complete contract and contract requirements before month supply runs out.

## 2016-04-12 NOTE — Telephone Encounter (Signed)
Called to follow up with patient.  Left a message for call back.   

## 2016-04-20 ENCOUNTER — Telehealth: Payer: Self-pay | Admitting: Medical

## 2016-04-20 MED ORDER — SIMVASTATIN 20 MG PO TABS: 20.0000 mg | ORAL_TABLET | Freq: Every day | ORAL | 1 refills | Status: DC

## 2016-04-20 NOTE — Telephone Encounter (Signed)
rx of simvistatin sent to pt pharmacy.

## 2016-04-21 ENCOUNTER — Ambulatory Visit (INDEPENDENT_AMBULATORY_CARE_PROVIDER_SITE_OTHER): Payer: Medicare Other | Admitting: Family Medicine

## 2016-04-21 VITALS — BP 99/81 | HR 78 | Temp 97.9°F | Ht 67.0 in | Wt 210.6 lb

## 2016-04-21 DIAGNOSIS — N3001 Acute cystitis with hematuria: Secondary | ICD-10-CM

## 2016-04-21 LAB — POCT URINALYSIS DIPSTICK
BILIRUBIN UA: NEGATIVE
Glucose, UA: NEGATIVE
KETONES UA: NEGATIVE
Nitrite, UA: NEGATIVE
Urobilinogen, UA: 4
pH, UA: 6

## 2016-04-21 MED ORDER — NITROFURANTOIN MONOHYD MACRO 100 MG PO CAPS: 100.0000 mg | ORAL_CAPSULE | Freq: Two times a day (BID) | ORAL | 0 refills | Status: AC

## 2016-04-21 NOTE — Progress Notes (Signed)
Chief Complaint  Patient presents with  . Dysuria    with frequent urination    Mallory Durham is a 75 y.o. female here for possible UTI.  Duration: 2 days. Symptoms: urinary frequency and dysuria Denies:  Hx of recurrent UTI? No Denies new sexual partners.  ROS:  Constitutional: denies fever GU: As noted in HPI MSK: Denies back pain Abd: Denies abdominal pain  Past Medical History:  Diagnosis Date  . Anxiety   . Confusion, hx of, without neuro findings   . Depression   . Hx of vertigo   . Hyperlipidemia   . Hypothyroid   . Left shoulder pain   . Migraines   . Neck pain   . Orthostatic hypotension   . Renal insufficiency    Family History  Problem Relation Age of Onset  . Heart disease Mother     Angina  . Cancer Mother   . Cancer Father   . Heart disease Father   . Cancer Maternal Aunt    Social History   Social History  . Marital status: Married   Occupational History  .  Retired   Social History Main Topics  . Smoking status: Never Smoker  . Smokeless tobacco: Never Used  . Alcohol use No  . Drug use: No   BP 99/81   Pulse 78   Temp 97.9 F (36.6 C) (Oral)   Ht 5\' 7"  (1.702 m)   Wt 210 lb 9.6 oz (95.5 kg)   SpO2 99%   BMI 32.98 kg/m  General: Awake, alert, appears stated age HEENT: MMM Heart: RRR, no murmurs Lungs: CTAB, normal respiratory effort, no accessory muscle usage Abd: BS+, soft, NT, ND, no masses or organomegaly MSK: No CVA tenderness, neg Lloyd's sign Psych: Age appropriate judgment and insight  Acute cystitis with hematuria - Plan: nitrofurantoin, macrocrystal-monohydrate, (MACROBID) 100 MG capsule, POCT urinalysis dipstick  Orders as above. UA suggestive of infection. Reviewed renal function as of 05/2015, nml. F/u in 7 days if symptoms fail to improve, sooner if things worsen. The patient voiced understanding and agreement to the plan.  Jilda Rocheicholas Paul FerndaleWendling, DO 04/21/16 11:47 AM

## 2016-04-21 NOTE — Progress Notes (Signed)
Pre visit review using our clinic tool,if applicable. No additional management support is needed unless otherwise documented below in the visit note.  

## 2016-04-22 ENCOUNTER — Encounter: Payer: Self-pay | Admitting: Medical

## 2016-04-22 ENCOUNTER — Telehealth: Payer: Self-pay

## 2016-04-22 NOTE — Telephone Encounter (Signed)
error:[id number] ° °

## 2016-04-22 NOTE — Telephone Encounter (Signed)
Patient called in states she is still having burning with urination,started ABT yesterday. Per Dr. Carmelia RollerWendling have patient take her evening dose and if no better in the am, call office for further instructions. Patient agreed.

## 2016-04-23 ENCOUNTER — Other Ambulatory Visit: Payer: Self-pay

## 2016-04-23 MED ORDER — CIPROFLOXACIN HCL 250 MG PO TABS: 250.0000 mg | ORAL_TABLET | Freq: Two times a day (BID) | ORAL | 0 refills | Status: DC

## 2016-04-23 NOTE — Telephone Encounter (Signed)
Pt called into the office and has not been feeling better from 04/21/16. Per Dr. Carmelia RollerWendling recommendation Rx was sent to pharmacy for Cipro 250mg  twice daily for 3 days,. #6 tablets. TL/CMA

## 2016-04-28 ENCOUNTER — Telehealth: Payer: Self-pay

## 2016-04-28 ENCOUNTER — Other Ambulatory Visit (HOSPITAL_COMMUNITY)
Admission: RE | Admit: 2016-04-28 | Discharge: 2016-04-28 | Disposition: A | Discharge status: Home / Self Care | Payer: Medicare Other | Source: Ambulatory Visit | Attending: Medical | Admitting: Medical

## 2016-04-28 DIAGNOSIS — R300 Dysuria: Secondary | ICD-10-CM | POA: Insufficient documentation

## 2016-04-28 MED ORDER — PHENAZOPYRIDINE HCL 100 MG PO TABS: 100.0000 mg | ORAL_TABLET | Freq: Three times a day (TID) | ORAL | 0 refills | Status: DC | PRN

## 2016-04-28 NOTE — Telephone Encounter (Signed)
rx pyridium sent to her pharmacy.

## 2016-04-28 NOTE — Telephone Encounter (Signed)
Please see previous note.

## 2016-04-28 NOTE — Telephone Encounter (Signed)
Patient was notified.

## 2016-04-28 NOTE — Telephone Encounter (Signed)
Patient states that she has been seen by previous providers with her UTI symptoms in August and December of this year and she states that she "has to have some medication before the end of this business day". Patient is requesting an antibiotic today and I notified the patient that she may need to be seen in the office before any medication can be sent to the pharmacy.  Please advise.

## 2016-04-28 NOTE — Telephone Encounter (Signed)
Will rx pyridium but have her come in tomorrow to for ua and do culture, ancillary bv/candid as well.  She does not need to see me unless she wants. If she wants to be seen 1-2 appointment. So she does not have to wait too long.  After I get ua result might rx more antibiotic pending culture. But need culture in event resistant type bacteria.

## 2016-04-29 ENCOUNTER — Telehealth: Payer: Self-pay | Admitting: Medical

## 2016-04-29 ENCOUNTER — Other Ambulatory Visit (INDEPENDENT_AMBULATORY_CARE_PROVIDER_SITE_OTHER): Payer: Medicare Other

## 2016-04-29 DIAGNOSIS — R300 Dysuria: Secondary | ICD-10-CM

## 2016-04-29 LAB — POC URINALSYSI DIPSTICK (AUTOMATED)
BILIRUBIN UA: NEGATIVE
GLUCOSE UA: NEGATIVE
Ketones, UA: NEGATIVE
NITRITE UA: NEGATIVE
PH UA: 6
RBC UA: NEGATIVE
Urobilinogen, UA: 0.2

## 2016-04-29 MED ORDER — CIPROFLOXACIN HCL 500 MG PO TABS: 500.0000 mg | ORAL_TABLET | Freq: Two times a day (BID) | ORAL | 0 refills | Status: DC

## 2016-04-29 NOTE — Telephone Encounter (Signed)
Ok order noted and received. KMP

## 2016-04-29 NOTE — Telephone Encounter (Addendum)
  Pt in stating hurts when urinating. She only had 3 days of cipro. No culture was done. Pt states after 2-3 off med her symptoms came back.   I called her in pyridium. It was going to cost her $35 dollars.   I will give her cipro 500 mg #14 1 tab po bid x 7 days.   But do want her to give adequate urine sample for culture, bv and candida studies.  Pt in agreement with the plan.   Gave my cell phone number pt can call me if any problems.  Verified with lab culture sent out with bv and candida. Start the cipro.

## 2016-05-01 LAB — URINE CULTURE

## 2016-05-03 LAB — URINE CYTOLOGY ANCILLARY ONLY
Bacterial vaginitis: NEGATIVE
CANDIDA VAGINITIS: NEGATIVE

## 2016-05-14 ENCOUNTER — Telehealth: Payer: Self-pay | Admitting: Medical

## 2016-05-14 MED ORDER — CIPROFLOXACIN HCL 500 MG PO TABS: 500.0000 mg | ORAL_TABLET | Freq: Two times a day (BID) | ORAL | 0 refills | Status: DC

## 2016-05-14 NOTE — Telephone Encounter (Signed)
Called patient and discussed provider's recommendation.  She was agreeable to coming in to drop off a urine sample.  Stated she will need to call around to arrange for transportation and will call back if she's able to find a ride.  Awaiting call back.

## 2016-05-14 NOTE — Telephone Encounter (Signed)
Against medical advice, pt refuses to come in to provide urine culture.  Provider advised to send in Rx for Cipro.  Pt made aware that medication was sent to pharmacy.  She was advised if symptoms worsen, she will need to be seen.  Pt stated understanding and agreed.

## 2016-05-14 NOTE — Telephone Encounter (Signed)
Please strongly encourage pt to come in today for urine culture to be sent out. She wants antibiotic for dysuria. Heading toward 3 day weekend. Explain to her the fact that I am not requiring office visit is giving her a break. So thus she can help us out and at least give us sample.   If we are dealing with different bacteria or resistant  we won't have any direction by the time Tuesday come when we open back up. So please have her come in and give sample.   Then you can send out the antibiotic to her pharmacy.  cipro  500 mg bid x 7 days.

## 2016-05-14 NOTE — Telephone Encounter (Signed)
Patient said she is have uti symptoms does not wish to come in . Frequent urination pain when urinating. Patient asking for an antibiotic be sent to pharmacy  [facility]. [phone] patient asked if we could so it call in today so she can have it delivered today

## 2016-05-14 NOTE — Telephone Encounter (Signed)
Patient called back to inform Mallory Durham that she does not feel like coming up here to do the urine culture.

## 2016-05-27 ENCOUNTER — Telehealth: Payer: Self-pay | Admitting: Medical

## 2016-05-27 ENCOUNTER — Ambulatory Visit (INDEPENDENT_AMBULATORY_CARE_PROVIDER_SITE_OTHER): Payer: Medicare Other | Admitting: Medical

## 2016-05-27 ENCOUNTER — Encounter: Payer: Self-pay | Admitting: Medical

## 2016-05-27 ENCOUNTER — Ambulatory Visit (HOSPITAL_BASED_OUTPATIENT_CLINIC_OR_DEPARTMENT_OTHER)
Admission: RE | Admit: 2016-05-27 | Discharge: 2016-05-27 | Disposition: A | Discharge status: Home / Self Care | Payer: Medicare Other | Source: Ambulatory Visit | Attending: Medical | Admitting: Medical

## 2016-05-27 VITALS — BP 100/64 | HR 83 | Temp 98.1°F

## 2016-05-27 DIAGNOSIS — J209 Acute bronchitis, unspecified: Secondary | ICD-10-CM

## 2016-05-27 DIAGNOSIS — R05 Cough: Secondary | ICD-10-CM | POA: Insufficient documentation

## 2016-05-27 DIAGNOSIS — R059 Cough, unspecified: Secondary | ICD-10-CM

## 2016-05-27 MED ORDER — FLUTICASONE PROPIONATE 50 MCG/ACT NA SUSP: 2.0000 | Freq: Every day | NASAL | 1 refills | Status: DC

## 2016-05-27 MED ORDER — HYDROCODONE-HOMATROPINE 5-1.5 MG/5ML PO SYRP: 5.0000 mL | ORAL_SOLUTION | Freq: Three times a day (TID) | ORAL | 0 refills | Status: DC | PRN

## 2016-05-27 MED ORDER — DOXYCYCLINE HYCLATE 100 MG PO TABS: 100.0000 mg | ORAL_TABLET | Freq: Two times a day (BID) | ORAL | 0 refills | Status: DC

## 2016-05-27 MED ORDER — ALBUTEROL SULFATE HFA 108 (90 BASE) MCG/ACT IN AERS: 2.0000 | INHALATION_SPRAY | Freq: Four times a day (QID) | RESPIRATORY_TRACT | 0 refills | Status: DC | PRN

## 2016-05-27 NOTE — Telephone Encounter (Signed)
I sent doxycycline to her pharmacy. Just sent it in. Please notify pt. I believe her pharmacy delivers her meds.

## 2016-05-27 NOTE — Progress Notes (Signed)
Subjective:    Patient ID: Mallory Durham, female    DOB: [birth year], 76 y.o.   MRN: [id number]  HPI  Pt had 2 wks of cough, congestion and runny nose. Started in her nose and after 3-4 days feels chest congestion. Intermittent daily productive cough.  No fever, no chills or sweats. No body aches.  Pt states when gets sick will usually not get over illness quickly and will develop bronchitis.  Pt tried sudafed and mucinex but has not helped. Review of Systems  Constitutional: Negative for chills, fatigue and fever.  HENT: Positive for congestion. Negative for ear pain, sinus pain and sinus pressure.   Respiratory: Positive for cough and wheezing. Negative for chest tightness and shortness of breath.        Occasional wheeze but not constant. Pt has inhaler. Old rx she is not sure what it is.  Some productive.  Cardiovascular: Negative for chest pain and palpitations.  Gastrointestinal: Negative for abdominal pain.  Musculoskeletal: Negative for back pain and myalgias.  Skin: Negative for rash.  Hematological: Negative for adenopathy. Does not bruise/bleed easily.  Psychiatric/Behavioral: Negative for behavioral problems and confusion.    Past Medical History:  Diagnosis Date  . Anxiety   . Confusion, hx of, without neuro findings   . Depression   . Hx of vertigo   . Hyperlipidemia   . Hypothyroid   . Left shoulder pain   . Migraines   . Neck pain   . Orthostatic hypotension   . Renal insufficiency      Social History   Social History  . Marital status: Married    Spouse name: N/A  . Number of children: 2  . Years of education: N/A   Occupational History  .  Retired   Social History Main Topics  . Smoking status: Never Smoker  . Smokeless tobacco: Never Used  . Alcohol use No  . Drug use: No  . Sexual activity: Not on file   Other Topics Concern  . Not on file   Social History Narrative  . No narrative on file    Past Surgical History:  Procedure  Laterality Date  . ABDOMINAL HYSTERECTOMY    . ABDOMINAL HYSTERECTOMY    . BREAST LUMPECTOMY    . CHOLECYSTECTOMY    . JOINT REPLACEMENT    . THYROID SURGERY    . TONSILLECTOMY      Family History  Problem Relation Age of Onset  . Heart disease Mother     Angina  . Cancer Mother   . Cancer Father   . Heart disease Father   . Cancer Maternal Aunt     No Known Allergies  Current Outpatient Prescriptions on File Prior to Visit  Medication Sig Dispense Refill  . buPROPion (WELLBUTRIN XL) 150 MG 24 hr tablet Take 1 tablet (150 mg total) by mouth daily. 30 tablet 5  . ciprofloxacin (CIPRO) 500 MG tablet Take 1 tablet (500 mg total) by mouth 2 (two) times daily. 14 tablet 0  . divalproex (DEPAKOTE) 500 MG DR tablet Take 1 tablet (500 mg total) by mouth daily. 30 tablet 3  . gabapentin (NEURONTIN) 300 MG capsule Take 2 capsules (600 mg total) by mouth 2 (two) times daily. 120 capsule 5  . Multiple Vitamins-Minerals (PRESERVISION AREDS 2) CAPS Take 2 capsules by mouth.    . phenazopyridine (PYRIDIUM) 100 MG tablet Take 1 tablet (100 mg total) by mouth 3 (three) times daily as needed for pain. 10 tablet  0  . sertraline (ZOLOFT) 100 MG tablet Take 1.5 tablets (150 mg total) by mouth daily.  0  . simvastatin (ZOCOR) 20 MG tablet Take 1 tablet (20 mg total) by mouth every evening. 30 tablet 1  . SUMAtriptan (IMITREX) 100 MG tablet TAKE 1 TABLET BY MOUTH MAY REPEAT IN 2 HOURS ONE TIME IF NEEDED 10 tablet 5  . SYNTHROID 125 MCG tablet Take 1 tablet (125 mcg total) by mouth daily before breakfast. 90 tablet 1  . zolpidem (AMBIEN) 10 MG tablet Take 0.5-1 tablets (5-10 mg total) by mouth at bedtime as needed. 15 tablet 3   No current facility-administered medications on file prior to visit.     BP 100/64 (BP Location: Left Arm, Patient Position: Sitting, Cuff Size: Normal)   Pulse 83   Temp 98.1 F (36.7 C) (Oral)   SpO2 99% Comment: RA      Objective:   Physical  Exam \\General   Mental Status - Alert. General Appearance - Well groomed. Not in acute distress.  Skin Rashes- No Rashes.  HEENT Head- Normal. Ear Auditory Canal - Left- Normal. Right - Normal.Tympanic Membrane- Left- Normal. Right- Normal. Eye Sclera/Conjunctiva- Left- Normal. Right- Normal. Nose & Sinuses Nasal Mucosa- Left-  Boggy and Congested. Right-  Boggy and  Congested.Bilateral no maxillary and no  frontal sinus pressure. Mouth & Throat Lips: Upper Lip- Normal: no dryness, cracking, pallor, cyanosis, or vesicular eruption. Lower Lip-Normal: no dryness, cracking, pallor, cyanosis or vesicular eruption. Buccal Mucosa- Bilateral- No Aphthous ulcers. Oropharynx- No Discharge or Erythema. Tonsils: Characteristics- Bilateral- No Erythema or Congestion. Size/Enlargement- Bilateral- No enlargement. Discharge- bilateral-None.  Neck Neck- Supple. No Masses.   Chest and Lung Exam Auscultation: Breath Sounds:- even and unlabored. Mild rough breath sounds at base rt lung.  Cardiovascular Auscultation:Rythm- Regular, rate and rhythm. Murmurs & Other Heart Sounds:Ausculatation of the heart reveal- No Murmurs.  Lymphatic Head & Neck General Head & Neck Lymphatics: Bilateral: Description- No Localized lymphadenopathy.        Assessment & Plan:  You appear to have bronchitis. Rest hydrate and tylenol for fever. I am prescribing cough medicine hycodan, and doxycycline antibiotic. For your nasal congestion rx flonase.  cxr today to evaluate rt lower lung region.  Rx albuterol if needed for wheezing.  Follow up in 7-10 days or as needed

## 2016-05-27 NOTE — Progress Notes (Signed)
Pre visit review using our clinic review tool, if applicable. No additional management support is needed unless otherwise documented below in the visit note. 

## 2016-05-27 NOTE — Telephone Encounter (Signed)
Spoke w/ Pt, informed that Rx's sent to pharmacy.

## 2016-05-27 NOTE — Patient Instructions (Addendum)
You appear to have bronchitis. Rest hydrate and tylenol for fever. I am prescribing cough medicine hycodan, and doxycycline antibiotic. For your nasal congestion rx flonase.  cxr today to evaluate rt lower lung region.  Rx albuterol if needed for wheezing.  Follow up in 7-10 days or as needed  Doxycycline sent at 2:30 pm to her pharmacy.

## 2016-05-27 NOTE — Telephone Encounter (Signed)
Please advise 

## 2016-05-27 NOTE — Telephone Encounter (Signed)
Pt called in to follow up. She says that she was under the impression that she was going to receive a antibiotic. Pt says that she is at the pharmacy to pick up and was told that there isn't a antibiotic Rx there. Pt would like to know if provider is planning to send one as discussed?  Please advise.   CB: [phone]

## 2016-05-28 ENCOUNTER — Telehealth: Payer: Self-pay | Admitting: Medical

## 2016-05-28 MED ORDER — FLUTICASONE PROPIONATE HFA 110 MCG/ACT IN AERO: 2.0000 | INHALATION_SPRAY | Freq: Two times a day (BID) | RESPIRATORY_TRACT | 1 refills | Status: DC

## 2016-05-28 MED ORDER — PREDNISONE 10 MG PO TABS: ORAL_TABLET | ORAL | 0 refills | Status: DC

## 2016-05-28 NOTE — Telephone Encounter (Signed)
Relation to ZO:XWRUpt:self Call back number:[phone] Pharmacy: [facility] - [place], [place] - [address] [phone] (Phone) [phone] (Fax)    Reason for call:  Patient was seen 05/27/16 and states symtomps have worsen since yesterday requesting Rx, please advise

## 2016-05-28 NOTE — Telephone Encounter (Signed)
Very early in treatment. Saw her yesterday. She is on 3 medications. Antibiotic, cough med and flonase. Called pt mobile and no answer. Called pt home phone and she picked up.  Pt states cough is severe. She thinks asthmatic bronchitis type flair. Had this in the past.   Did hear wheeze on the phone mild on phone.   Advise pt to use the albutero inhaler but she did not use this yet. Pharmacy did not deliver this until today.  Decided to rx prednisone taper dose and flovent inhaler. Continue other meds.  I instructed patient clearly that if she does not improve or worsens then ED evaluation. She agreed. Also asked her to update me on Monday.  Called pt at 5:41 on Friday  I did call prednisone 10 mg day taper dose to her pharmacy. Pharmacy will call pt and explain that she has to pick up rx and they won't deliver.

## 2016-05-28 NOTE — Telephone Encounter (Signed)
As per 05/27/16 office visit notes patient saw Mallory Durham, office notes states:   You appear to have bronchitis. Rest hydrate and tylenol for fever. I am prescribing cough medicine hycodan, and doxycycline antibiotic. For your nasal congestion rx flonase.   Patient symptoms not improving requesting Rx, please advise

## 2016-06-24 ENCOUNTER — Ambulatory Visit (INDEPENDENT_AMBULATORY_CARE_PROVIDER_SITE_OTHER): Payer: Medicare Other | Admitting: Medical

## 2016-06-24 ENCOUNTER — Telehealth: Payer: Self-pay | Admitting: Medical

## 2016-06-24 VITALS — BP 120/70 | HR 81 | Temp 98.2°F | Wt 210.4 lb

## 2016-06-24 DIAGNOSIS — R822 Biliuria: Secondary | ICD-10-CM | POA: Diagnosis not present

## 2016-06-24 DIAGNOSIS — R82998 Other abnormal findings in urine: Secondary | ICD-10-CM

## 2016-06-24 DIAGNOSIS — N3000 Acute cystitis without hematuria: Secondary | ICD-10-CM | POA: Diagnosis not present

## 2016-06-24 DIAGNOSIS — R300 Dysuria: Secondary | ICD-10-CM | POA: Diagnosis not present

## 2016-06-24 DIAGNOSIS — R8299 Other abnormal findings in urine: Secondary | ICD-10-CM

## 2016-06-24 LAB — POC URINALSYSI DIPSTICK (AUTOMATED)
Bilirubin, UA: POSITIVE
GLUCOSE UA: POSITIVE
Nitrite, UA: POSITIVE
PH UA: 5
RBC UA: NEGATIVE
UROBILINOGEN UA: 4

## 2016-06-24 MED ORDER — CIPROFLOXACIN HCL 500 MG PO TABS: 500.0000 mg | ORAL_TABLET | Freq: Two times a day (BID) | ORAL | 0 refills | Status: DC

## 2016-06-24 NOTE — Progress Notes (Signed)
Pre visit review using our clinic review tool, if applicable. No additional management support is needed unless otherwise documented below in the visit note. 

## 2016-06-24 NOTE — Progress Notes (Signed)
Subjective:    Patient ID: Mallory Durham, female    DOB: [birth year], 76 y.o.   MRN: [id number]  HPI   Pt in for evaluation. Pt had some on and off urine symptoms past 2-3 days.  She had on and off faint pain on urination. But now severe pain and very frequent urination last 24 hours. Chills during urination. No fever or sweats.   No back pain.   No nausea or vomiting.  Rare uti when she was younger.  Last 4 month more frequent uti symptoms.   Review of Systems  Constitutional: Negative for chills and fatigue.  Respiratory: Negative for cough, chest tightness, shortness of breath and wheezing.   Genitourinary: Positive for dysuria and frequency. Negative for difficulty urinating, genital sores, menstrual problem, pelvic pain and urgency.  Neurological: Negative for dizziness and headaches.  Hematological: Negative for adenopathy. Does not bruise/bleed easily.  Psychiatric/Behavioral: Negative for behavioral problems and decreased concentration.    Past Medical History:  Diagnosis Date  . Anxiety   . Confusion, hx of, without neuro findings   . Depression   . Hx of vertigo   . Hyperlipidemia   . Hypothyroid   . Left shoulder pain   . Migraines   . Neck pain   . Orthostatic hypotension   . Renal insufficiency      Social History   Social History  . Marital status: Married    Spouse name: N/A  . Number of children: 2  . Years of education: N/A   Occupational History  .  Retired   Social History Main Topics  . Smoking status: Never Smoker  . Smokeless tobacco: Never Used  . Alcohol use No  . Drug use: No  . Sexual activity: Not on file   Other Topics Concern  . Not on file   Social History Narrative  . No narrative on file    Past Surgical History:  Procedure Laterality Date  . ABDOMINAL HYSTERECTOMY    . ABDOMINAL HYSTERECTOMY    . BREAST LUMPECTOMY    . CHOLECYSTECTOMY    . JOINT REPLACEMENT    . THYROID SURGERY    . TONSILLECTOMY       Family History  Problem Relation Age of Onset  . Heart disease Mother     Angina  . Cancer Mother   . Cancer Father   . Heart disease Father   . Cancer Maternal Aunt     No Known Allergies  Current Outpatient Prescriptions on File Prior to Visit  Medication Sig Dispense Refill  . albuterol (PROVENTIL HFA;VENTOLIN HFA) 108 (90 Base) MCG/ACT inhaler Inhale 2 puffs into the lungs every 6 (six) hours as needed for wheezing or shortness of breath. 1 Inhaler 0  . buPROPion (WELLBUTRIN XL) 150 MG 24 hr tablet Take 1 tablet (150 mg total) by mouth daily. 30 tablet 5  . ciprofloxacin (CIPRO) 500 MG tablet Take 1 tablet (500 mg total) by mouth 2 (two) times daily. 14 tablet 0  . divalproex (DEPAKOTE) 500 MG DR tablet Take 1 tablet (500 mg total) by mouth daily. 30 tablet 3  . doxycycline (VIBRA-TABS) 100 MG tablet Take 1 tablet (100 mg total) by mouth 2 (two) times daily. Can give caps or generic 20 tablet 0  . fluticasone (FLONASE) 50 MCG/ACT nasal spray Place 2 sprays into both nostrils daily. 16 g 1  . fluticasone (FLOVENT HFA) 110 MCG/ACT inhaler Inhale 2 puffs into the lungs 2 (two) times daily. 1 Inhaler  1  . gabapentin (NEURONTIN) 300 MG capsule Take 2 capsules (600 mg total) by mouth 2 (two) times daily. 120 capsule 5  . HYDROcodone-homatropine (HYCODAN) 5-1.5 MG/5ML syrup Take 5 mLs by mouth every 8 (eight) hours as needed for cough. 120 mL 0  . Multiple Vitamins-Minerals (PRESERVISION AREDS 2) CAPS Take 2 capsules by mouth.    . phenazopyridine (PYRIDIUM) 100 MG tablet Take 1 tablet (100 mg total) by mouth 3 (three) times daily as needed for pain. 10 tablet 0  . predniSONE (DELTASONE) 10 MG tablet 6 TAB PO DAY 1 5 TAB PO DAY 2 4 TAB PO DAY 3 3 TAB PO DAY 4 2 TAB PO DAY 5 1 TAB PO DAY 6 21 tablet 0  . sertraline (ZOLOFT) 100 MG tablet Take 1.5 tablets (150 mg total) by mouth daily.  0  . simvastatin (ZOCOR) 20 MG tablet Take 1 tablet (20 mg total) by mouth every evening. 30  tablet 1  . SUMAtriptan (IMITREX) 100 MG tablet TAKE 1 TABLET BY MOUTH MAY REPEAT IN 2 HOURS ONE TIME IF NEEDED 10 tablet 5  . SYNTHROID 125 MCG tablet Take 1 tablet (125 mcg total) by mouth daily before breakfast. 90 tablet 1  . zolpidem (AMBIEN) 10 MG tablet Take 0.5-1 tablets (5-10 mg total) by mouth at bedtime as needed. 15 tablet 3   No current facility-administered medications on file prior to visit.     BP 120/70 (BP Location: Left Arm, Patient Position: Sitting, Cuff Size: Normal)   Pulse 81   Temp 98.2 F (36.8 C) (Oral)   Wt 210 lb 6.4 oz (95.4 kg)   SpO2 99%   BMI 32.95 kg/m       Objective:   Physical Exam  General Appearance- Not in acute distress.  HEENT Eyes- Scleraeral/Conjuntiva-bilat- Not Yellow. Mouth & Throat- Normal.  Chest and Lung Exam Auscultation: Breath sounds:-Normal. Adventitious sounds:- No Adventitious sounds.  Cardiovascular Auscultation:Rythm - Regular. Heart Sounds -Normal heart sounds.  Abdomen Inspection:-Inspection Normal.  Palpation/Perucssion: Palpation and Percussion of the abdomen reveal- no suprapubic Tender, No Rebound tenderness, No rigidity(Guarding) and No Palpable abdominal masses.  Liver:-Normal.  Spleen:- Normal.   Back- no cva tenderness.       Assessment & Plan:  You appear to have a urinary tract infection. I am prescribing  cipro antibiotic for the probable infection. Hydrate well. I am sending out a urine culture. During the interim if your signs and symptoms worsen rather than improving please notify us. We will notify your when the culture results are back.  Follow up in 7 days or as needed.  If recurrent culture show repeat infection over 100,000 bacteria consider preventative treatment.  Cmp pending as well since bilirubin seen on urine test.

## 2016-06-24 NOTE — Patient Instructions (Addendum)
You appear to have a urinary tract infection. I am prescribing  cipro antibiotic for the probable infection. Hydrate well. I am sending out a urine culture. During the interim if your signs and symptoms worsen rather than improving please notify us. We will notify your when the culture results are back.  Follow up in 7 days or as needed.  If recurrent culture show repeat infection over 100,000 bacteria consider preventative treatment.  Cmp pending as well since bilirubin seen on urine test.

## 2016-06-25 DIAGNOSIS — R300 Dysuria: Secondary | ICD-10-CM | POA: Diagnosis not present

## 2016-06-25 DIAGNOSIS — R8299 Other abnormal findings in urine: Secondary | ICD-10-CM | POA: Diagnosis not present

## 2016-06-25 LAB — COMPREHENSIVE METABOLIC PANEL
ALT: 10 U/L (ref 0–35)
AST: 12 U/L (ref 0–37)
Albumin: 3.5 g/dL (ref 3.5–5.2)
Alkaline Phosphatase: 68 U/L (ref 39–117)
BILIRUBIN TOTAL: 0.2 mg/dL (ref 0.2–1.2)
BUN: 12 mg/dL (ref 6–23)
CALCIUM: 8.5 mg/dL (ref 8.4–10.5)
CHLORIDE: 108 meq/L (ref 96–112)
CO2: 28 meq/L (ref 19–32)
Creatinine, Ser: 0.97 mg/dL (ref 0.40–1.20)
GFR: 59.42 mL/min — AB (ref >60.00)
Glucose, Bld: 109 mg/dL — ABNORMAL HIGH (ref 70–99)
Potassium: 4.2 mEq/L (ref 3.5–5.1)
Sodium: 141 mEq/L (ref 135–145)
Total Protein: 5.8 g/dL — ABNORMAL LOW (ref 6.0–8.3)

## 2016-06-25 LAB — CBC
HEMATOCRIT: 39.4 % (ref 36.0–46.0)
HEMOGLOBIN: 12.8 g/dL (ref 12.0–15.0)
MCHC: 32.5 g/dL (ref 30.0–36.0)
MCV: 91.3 fl (ref 78.0–100.0)
PLATELETS: 248 10*3/uL (ref 150.0–400.0)
RBC: 4.32 Mil/uL (ref 3.87–5.11)
RDW: 14.4 % (ref 11.5–15.5)
WBC: 6.2 10*3/uL (ref 4.0–10.5)

## 2016-06-25 NOTE — Addendum Note (Signed)
Addended by: Harley AltoPRICE, Saraiya Kozma M on: 06/25/2016 08:25 AM   Modules accepted: Orders

## 2016-06-25 NOTE — Telephone Encounter (Signed)
Opened to review 

## 2016-06-27 LAB — URINE CULTURE

## 2016-07-05 ENCOUNTER — Encounter: Payer: Self-pay | Admitting: Medical

## 2016-07-05 ENCOUNTER — Ambulatory Visit (INDEPENDENT_AMBULATORY_CARE_PROVIDER_SITE_OTHER): Payer: Medicare Other | Admitting: Medical

## 2016-07-05 VITALS — BP 100/70 | HR 81 | Temp 98.3°F | Resp 16 | Ht 67.0 in | Wt 210.5 lb

## 2016-07-05 DIAGNOSIS — R55 Syncope and collapse: Secondary | ICD-10-CM | POA: Diagnosis not present

## 2016-07-05 DIAGNOSIS — R42 Dizziness and giddiness: Secondary | ICD-10-CM

## 2016-07-05 DIAGNOSIS — N390 Urinary tract infection, site not specified: Secondary | ICD-10-CM | POA: Diagnosis not present

## 2016-07-05 DIAGNOSIS — I959 Hypotension, unspecified: Secondary | ICD-10-CM

## 2016-07-05 DIAGNOSIS — E039 Hypothyroidism, unspecified: Secondary | ICD-10-CM | POA: Diagnosis not present

## 2016-07-05 DIAGNOSIS — I951 Orthostatic hypotension: Secondary | ICD-10-CM

## 2016-07-05 LAB — COMPREHENSIVE METABOLIC PANEL
ALBUMIN: 3.7 g/dL (ref 3.5–5.2)
ALT: 11 U/L (ref 0–35)
AST: 14 U/L (ref 0–37)
Alkaline Phosphatase: 60 U/L (ref 39–117)
BUN: 12 mg/dL (ref 6–23)
CHLORIDE: 107 meq/L (ref 96–112)
CO2: 27 mEq/L (ref 19–32)
Calcium: 8.4 mg/dL (ref 8.4–10.5)
Creatinine, Ser: 1.1 mg/dL (ref 0.40–1.20)
GFR: 51.39 mL/min — ABNORMAL LOW (ref >60.00)
Glucose, Bld: 94 mg/dL (ref 70–99)
POTASSIUM: 4.3 meq/L (ref 3.5–5.1)
SODIUM: 141 meq/L (ref 135–145)
Total Bilirubin: 0.3 mg/dL (ref 0.2–1.2)
Total Protein: 6.2 g/dL (ref 6.0–8.3)

## 2016-07-05 LAB — CBC WITH DIFFERENTIAL/PLATELET
BASOS PCT: 0.8 % (ref 0.0–3.0)
Basophils Absolute: 0.1 10*3/uL (ref 0.0–0.1)
Eosinophils Absolute: 0.5 10*3/uL (ref 0.0–0.7)
Eosinophils Relative: 7.1 % — ABNORMAL HIGH (ref 0.0–5.0)
HCT: 39.8 % (ref 36.0–46.0)
HEMOGLOBIN: 13.2 g/dL (ref 12.0–15.0)
LYMPHS ABS: 2.5 10*3/uL (ref 0.7–4.0)
Lymphocytes Relative: 34.6 % (ref 12.0–46.0)
MCHC: 33.2 g/dL (ref 30.0–36.0)
MCV: 90.7 fl (ref 78.0–100.0)
MONO ABS: 0.7 10*3/uL (ref 0.1–1.0)
Monocytes Relative: 9.5 % (ref 3.0–12.0)
NEUTROS ABS: 3.5 10*3/uL (ref 1.4–7.7)
NEUTROS PCT: 48 % (ref 43.0–77.0)
Platelets: 269 10*3/uL (ref 150.0–400.0)
RBC: 4.39 Mil/uL (ref 3.87–5.11)
RDW: 14.2 % (ref 11.5–15.5)
WBC: 7.3 10*3/uL (ref 4.0–10.5)

## 2016-07-05 LAB — POC URINALSYSI DIPSTICK (AUTOMATED)
BILIRUBIN UA: NEGATIVE
Blood, UA: NEGATIVE
GLUCOSE UA: NEGATIVE
KETONES UA: NEGATIVE
Leukocytes, UA: NEGATIVE
Nitrite, UA: NEGATIVE
Protein, UA: NEGATIVE
SPEC GRAV UA: 1.025
Urobilinogen, UA: 0.2
pH, UA: 6

## 2016-07-05 NOTE — Progress Notes (Signed)
Pre visit review using our clinic review tool, if applicable. No additional management support is needed unless otherwise documented below in the visit note/SLS  

## 2016-07-05 NOTE — Patient Instructions (Addendum)
Your blood pressure has been low in the past. It appears to be baseline low. With recent near syncope we need to make sure no condition present such as anemia or dehydration. Also you need to be very careful and move slowly between changing poistion. Ambulating only after gaining your balance.   Today will get cbc, cmp, tsh, t4 study, ua and ifob.  I do think it will be good idea to have a walker available to help making transition in position. Use compression stocking.  Check bp daily 2-3 times a day and if bp remaining too low or you are symptomatic then refer to cardiologist Dr. Jens Somrenshaw.Update me on your bp reading this Thursday am. Any severe syncope or syncope then ED evaluation.  Follow up date to be determined after lab review and blood pressure review.

## 2016-07-05 NOTE — Progress Notes (Signed)
Subjective:    Patient ID: Mallory Durham, female    DOB: December 23, 1940, 75 y.o.   MRN: [id number]  HPI   Pt in for some recent low blood pressure while she was at a MD office with her husband. Pt states at times will feel weak and like she may pass out. She states felt like this past Monday briefly while waiting in line. She had to sit down and get her balance. Lasted for about 2 minutes. Then later that night last Monday had brief light headed sensation.  Last Thursday she had hypotension at orthopedist office of her husband.. On varius readings she had very low blood pressure. Pt states the numbers were so low that could get bp levels with arm cuff. Then could get a bp reading with wrist cuff. But pt can't remember the reading. Pt in past saw Dr. Jens Som for some low blood pressure. Pt was last seen by him in 2014. Back then had confusion episode that required work up. Negative mri, and negative mra. Back then recommended increase na intake and fluid intake. In 2013 holter showed pvc's.  Other day when bp was low had some sob when light headed. No chest pain. No shoulder pain or arm pain.   Pt light headed sensation occurred both standing on past Monday and past Thursday.  Since leaving the office of ortho on Thursday pt has felt fine.  Pt does not wear compression stocking.  Pt last tsh was checked in November.  Pt has not htn on review. No bp meds.  No dark appearance to stools. No black stools. No abdomen pain.   Lying down bp today 118/76 by MA Jasmine December.   Review of Systems  Constitutional: Negative for chills, fatigue and fever.  HENT: Negative for congestion, ear pain and hearing loss.   Respiratory: Negative for cough, chest tightness and shortness of breath.   Cardiovascular: Negative for chest pain and palpitations.  Gastrointestinal: Negative for abdominal pain.  Genitourinary: Negative for decreased urine volume, difficulty urinating, dysuria and hematuria.    Musculoskeletal: Negative for back pain, myalgias and neck stiffness.  Neurological: Negative for dizziness, syncope, weakness, numbness and headaches.       Not presently but see hpi.  Hematological: Negative for adenopathy. Does not bruise/bleed easily.  Psychiatric/Behavioral: Negative for behavioral problems, confusion and suicidal ideas. The patient is not hyperactive.     Past Medical History:  Diagnosis Date  . Anxiety   . Confusion, hx of, without neuro findings   . Depression   . Hx of vertigo   . Hyperlipidemia   . Hypothyroid   . Left shoulder pain   . Migraines   . Neck pain   . Orthostatic hypotension   . Renal insufficiency      Social History   Social History  . Marital status: Married    Spouse name: N/A  . Number of children: 2  . Years of education: N/A   Occupational History  .  Retired   Social History Main Topics  . Smoking status: Never Smoker  . Smokeless tobacco: Never Used  . Alcohol use No  . Drug use: No  . Sexual activity: Not on file   Other Topics Concern  . Not on file   Social History Narrative  . No narrative on file    Past Surgical History:  Procedure Laterality Date  . ABDOMINAL HYSTERECTOMY    . ABDOMINAL HYSTERECTOMY    . BREAST LUMPECTOMY    .  CHOLECYSTECTOMY    . JOINT REPLACEMENT    . THYROID SURGERY    . TONSILLECTOMY      Family History  Problem Relation Age of Onset  . Heart disease Mother     Angina  . Cancer Mother   . Cancer Father   . Heart disease Father   . Cancer Maternal Aunt     No Known Allergies  Current Outpatient Prescriptions on File Prior to Visit  Medication Sig Dispense Refill  . albuterol (PROVENTIL HFA;VENTOLIN HFA) 108 (90 Base) MCG/ACT inhaler Inhale 2 puffs into the lungs every 6 (six) hours as needed for wheezing or shortness of breath. 1 Inhaler 0  . buPROPion (WELLBUTRIN XL) 150 MG 24 hr tablet Take 1 tablet (150 mg total) by mouth daily. 30 tablet 5  . divalproex  (DEPAKOTE) 500 MG DR tablet Take 1 tablet (500 mg total) by mouth daily. 30 tablet 3  . fluticasone (FLONASE) 50 MCG/ACT nasal spray Place 2 sprays into both nostrils daily. (Patient taking differently: Place 2 sprays into both nostrils daily as needed. ) 16 g 1  . fluticasone (FLOVENT HFA) 110 MCG/ACT inhaler Inhale 2 puffs into the lungs 2 (two) times daily. (Patient taking differently: Inhale 2 puffs into the lungs 2 (two) times daily as needed. ) 1 Inhaler 1  . gabapentin (NEURONTIN) 300 MG capsule Take 2 capsules (600 mg total) by mouth 2 (two) times daily. 120 capsule 5  . Multiple Vitamins-Minerals (PRESERVISION AREDS 2) CAPS Take 2 capsules by mouth.    . phenazopyridine (PYRIDIUM) 100 MG tablet Take 1 tablet (100 mg total) by mouth 3 (three) times daily as needed for pain. 10 tablet 0  . sertraline (ZOLOFT) 100 MG tablet Take 1.5 tablets (150 mg total) by mouth daily.  0  . simvastatin (ZOCOR) 20 MG tablet Take 1 tablet (20 mg total) by mouth every evening. 30 tablet 1  . SUMAtriptan (IMITREX) 100 MG tablet TAKE 1 TABLET BY MOUTH MAY REPEAT IN 2 HOURS ONE TIME IF NEEDED 10 tablet 5  . SYNTHROID 125 MCG tablet Take 1 tablet (125 mcg total) by mouth daily before breakfast. 90 tablet 1  . zolpidem (AMBIEN) 10 MG tablet Take 0.5-1 tablets (5-10 mg total) by mouth at bedtime as needed. 15 tablet 3   No current facility-administered medications on file prior to visit.     BP 100/70 Comment: left arm and rt arm sitting. 95/70 left arm standing.  Pulse 81   Temp 98.3 F (36.8 C) (Oral)   Resp 16   Ht 5\' 7"  (1.702 m)   Wt 210 lb 8 oz (95.5 kg)   SpO2 98%   BMI 32.97 kg/m       Objective:   Physical Exam  General Mental Status- Alert. General Appearance- Not in acute distress.   Skin General: Color- Normal Color. Moisture- Normal Moisture.  Neck Carotid Arteries- Normal color. Moisture- Normal Moisture. No carotid bruits. No JVD.  Chest and Lung Exam Auscultation: Breath  Sounds:-Normal.  Cardiovascular Auscultation:Rythm- Regular. Murmurs & Other Heart Sounds:Auscultation of the heart reveals- No Murmurs.  Abdomen Inspection:-Inspeection Normal. Palpation/Percussion:Note:No mass. Palpation and Percussion of the abdomen reveal- Non Tender, Non Distended + BS, no rebound or guarding.    Neurologic Cranial Nerve exam:- CN III-XII intact(No nystagmus), symmetric smile. Drift Test:- No drift. Finger to Nose:- Normal/Intact  Strength:- 5/5 equal and symmetric strength both upper and lower extremities.      Assessment & Plan:  ekg showed sinus rhythm.nonspecific  st depression.  Your blood pressure has been low in the past. It appears to be baseline low. With recent near syncope we need to make sure no condition present such as anemia or dehydration. Also you need to be very careful and move slowly between changing poistion. Ambulating only after gaining your balance.   Today will get cbc, cmp, tsh, t4 study, ua and ifob.  I do think it will be good idea to have a walker available to help making transition in position. Use compression stocking.  Check bp daily 2-3 times a day and if bp remaining too low or you are symptomatic then refer to cardiologist Dr. Jens Somrenshaw.Update me on your bp reading this Thursday am.   Follow up date to be determined after lab review and blood pressure review.  Gustavo Meditz, Ramon DredgeEdward, PA-C

## 2016-07-06 LAB — TSH: TSH: 0.38 u[IU]/mL (ref 0.35–4.50)

## 2016-07-06 LAB — T4, FREE: Free T4: 0.95 ng/dL (ref 0.60–1.60)

## 2016-07-11 ENCOUNTER — Telehealth: Payer: Self-pay | Admitting: Medical

## 2016-07-11 NOTE — Telephone Encounter (Signed)
Pt did not do ifob as was ordered for her hypotension. If she does come in then place order again.

## 2016-07-12 ENCOUNTER — Other Ambulatory Visit: Payer: Self-pay | Admitting: Emergency Medicine

## 2016-07-12 DIAGNOSIS — I959 Hypotension, unspecified: Secondary | ICD-10-CM

## 2016-07-12 NOTE — Telephone Encounter (Signed)
Future order was placed for the Ifob, otherwise we have no way of knowing if this patient needs to collect the Ifob or not. She has no future appts scheduled.  KMP

## 2016-07-29 ENCOUNTER — Telehealth (INDEPENDENT_AMBULATORY_CARE_PROVIDER_SITE_OTHER): Payer: Self-pay | Admitting: "Endocrinology

## 2016-08-03 NOTE — Telephone Encounter (Signed)
Opened wrong patient  

## 2016-08-24 ENCOUNTER — Telehealth: Payer: Self-pay | Admitting: Medical

## 2016-08-24 NOTE — Telephone Encounter (Signed)
Called patient to schedule awv. Left msg for patient to call office to schedule appt.  °

## 2016-08-25 ENCOUNTER — Ambulatory Visit (INDEPENDENT_AMBULATORY_CARE_PROVIDER_SITE_OTHER): Payer: Medicare Other | Admitting: Family Medicine

## 2016-08-25 ENCOUNTER — Encounter: Payer: Self-pay | Admitting: Family Medicine

## 2016-08-25 VITALS — BP 96/76 | HR 77 | Temp 98.4°F | Ht 67.0 in | Wt 204.6 lb

## 2016-08-25 DIAGNOSIS — G43909 Migraine, unspecified, not intractable, without status migrainosus: Secondary | ICD-10-CM | POA: Diagnosis not present

## 2016-08-25 MED ORDER — KETOROLAC TROMETHAMINE 30 MG/ML IJ SOLN
30.0000 mg | Freq: Once | INTRAMUSCULAR | Status: AC
  Administered 2016-08-25: 30 mg via INTRAMUSCULAR

## 2016-08-25 NOTE — Patient Instructions (Signed)
No NSAIDs (aspirin, Aleve, Advil, Motrin, ibuprofen, etc) for the rest of today.   Try to stay well hydrated.

## 2016-08-25 NOTE — Progress Notes (Signed)
Pre visit review using our clinic review tool, if applicable. No additional management support is needed unless otherwise documented below in the visit note. 

## 2016-08-25 NOTE — Progress Notes (Signed)
Chief Complaint  Patient presents with  . Migraine    pt. reports migraine since this past Saturday, pain 8/10, some occassional nausea, no vomiting, feel very weak, last took Imitrex a half tablet around 8:30 AM today    Mallory Durham is a 76 y.o. female here for evaluation of headache.  Duration: 1 day Palliation: None Provocation: light and sound Severity: 10 earlier, slightly better now Quality: sharp, R sided Associated symptoms: sonophobia, photophobia Denies: nausea, vomiting, vertigo, nasal congestion, rhinorrhea, numbness, tingling, weakness Failed therapies: Imitrex  Past Medical History:  Diagnosis Date  . Anxiety   . Confusion, hx of, without neuro findings   . Depression   . Hx of vertigo   . Hyperlipidemia   . Hypothyroid   . Left shoulder pain   . Migraines   . Neck pain   . Orthostatic hypotension   . Renal insufficiency    Family History  Problem Relation Age of Onset  . Heart disease Mother     Angina  . Cancer Mother   . Cancer Father   . Heart disease Father   . Cancer Maternal Aunt    Current Meds  Medication Sig  . buPROPion (WELLBUTRIN XL) 150 MG 24 hr tablet Take 1 tablet (150 mg total) by mouth daily.  . divalproex (DEPAKOTE) 500 MG DR tablet Take 1 tablet (500 mg total) by mouth daily.  Marland Kitchen gabapentin (NEURONTIN) 300 MG capsule Take 2 capsules (600 mg total) by mouth 2 (two) times daily.  . Multiple Vitamins-Minerals (PRESERVISION AREDS 2) CAPS Take 2 capsules by mouth.  . sertraline (ZOLOFT) 100 MG tablet Take 1.5 tablets (150 mg total) by mouth daily.  . simvastatin (ZOCOR) 20 MG tablet Take 1 tablet (20 mg total) by mouth every evening.  . SUMAtriptan (IMITREX) 100 MG tablet TAKE 1 TABLET BY MOUTH MAY REPEAT IN 2 HOURS ONE TIME IF NEEDED  . SYNTHROID 125 MCG tablet Take 1 tablet (125 mcg total) by mouth daily before breakfast.  . zolpidem (AMBIEN) 10 MG tablet Take 0.5-1 tablets (5-10 mg total) by mouth at bedtime as needed.    BP 96/76  (BP Location: Left Arm, Cuff Size: Large)   Pulse 77   Temp 98.4 F (36.9 C) (Oral)   Ht  (1.702 m)   Wt 204 lb 9.6 oz (92.8 kg)   SpO2 100%   BMI 32.04 kg/m  General: awake, alert, appearing stated age Eyes: PERRLA, EOMi Heart: RRR, no murmurs, no bruits Lungs: CTAB, no accessory muscle use Neuro: CN 2-12 intact, no cerebellar signs, DTR's equal and symmetry, no clonus MSK: 5/5 strength throughout, normal gait, no TTP over posterior cervical triangle or paraspinal cervical musculature Psych: Age appropriate judgment and insight, mood and affect normal  Migraine without status migrainosus, not intractable, unspecified migraine type - Plan: ketorolac (TORADOL) 30 MG/ML injection 30 mg  Orders as above. No NSAIDs for rest of day. If no improvement, would try calling in Migranal. Follow up w reg PCP as originally scheduled or prn. The patient voiced understanding and agreement to the plan.  Jilda Roche [place], [place] 11:58 AM 08/25/16

## 2016-09-16 ENCOUNTER — Telehealth: Payer: Self-pay

## 2016-09-16 MED ORDER — ZOLPIDEM TARTRATE 10 MG PO TABS: 5.0000 mg | ORAL_TABLET | Freq: Every evening | ORAL | 3 refills | Status: DC | PRN

## 2016-09-16 NOTE — Telephone Encounter (Signed)
ambien rx printed and after I sign you can fax to her pharmacy.

## 2016-09-16 NOTE — Telephone Encounter (Signed)
Refill request: zolpidem  Last RX:03/31/16 x 3 refills  Last OV:08/25/16 Next WU:JWJXOV:none UDS:03/31/16 BJY:NWGNCSC:none

## 2016-09-17 MED ORDER — DIVALPROEX SODIUM 500 MG PO DR TAB: 500.0000 mg | DELAYED_RELEASE_TABLET | Freq: Every day | ORAL | 3 refills | Status: DC

## 2016-09-17 NOTE — Telephone Encounter (Signed)
Rx faxed to pharmacy  

## 2016-09-21 ENCOUNTER — Ambulatory Visit: Payer: Medicare Other | Admitting: Internal Medicine

## 2016-09-23 ENCOUNTER — Telehealth: Payer: Self-pay | Admitting: Internal Medicine

## 2016-09-23 NOTE — Telephone Encounter (Signed)
3mo

## 2016-09-23 NOTE — Telephone Encounter (Signed)
Patient no showed today's appt. Please advise on how to follow up. °A. No follow up necessary. °B. Follow up urgent. Contact patient immediately. °C. Follow up necessary. Contact patient and schedule visit in ___ days. °D. Follow up advised. Contact patient and schedule visit in ____weeks. ° °

## 2016-09-27 ENCOUNTER — Ambulatory Visit (INDEPENDENT_AMBULATORY_CARE_PROVIDER_SITE_OTHER): Payer: Medicare Other | Admitting: Internal Medicine

## 2016-09-27 ENCOUNTER — Encounter: Payer: Self-pay | Admitting: Internal Medicine

## 2016-09-27 VITALS — BP 122/72 | HR 80 | Wt 209.0 lb

## 2016-09-27 DIAGNOSIS — E890 Postprocedural hypothyroidism: Secondary | ICD-10-CM

## 2016-09-27 LAB — T4, FREE: FREE T4: 1.13 ng/dL (ref 0.60–1.60)

## 2016-09-27 LAB — TSH: TSH: 0.26 u[IU]/mL — AB (ref 0.35–4.50)

## 2016-09-27 NOTE — Patient Instructions (Signed)
Please stop at the lab. ° °Please continue Synthroid 125 mcg daily. ° °Take the thyroid hormone every day, with water, at least 30 minutes before breakfast, separated by at least 4 hours from: °- acid reflux medications °- calcium °- iron °- multivitamins ° °Please come back for a follow-up appointment in 1 year. ° ° °

## 2016-09-27 NOTE — Progress Notes (Signed)
Patient ID: Mallory RoyaltyLinda Seidenberg, female   DOB: 05-02-1941, 76 y.o.   MRN: [id number]   HPI  Mallory RoyaltyLinda Cadden is a 76 y.o.-year-old female, returning for f/u for postsurgical hypothyroidism. Last visit 3 mo ago.  She is having a lot of stress with her husband >> dementia. She is not sleeping at night as he has to wake up and go to the [place] 2x a night.  Reviewed and addended hx: 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  She is taking Synthroid DAW: - in am - fasting - at least 30 min from b'fast - no Ca, Fe, MVI,  - + PPIs as needed - not on Biotin - + AREDs 2 (no Ca or iron) 2x a day, first dose in am, with b'fast  I reviewed pt's thyroid tests:  Lab Results  Component Value Date   TSH 0.38 07/05/2016   TSH 0.37 03/22/2016   TSH 0.24 (L) 11/20/2015   TSH 0.03 (L) 08/26/2015   TSH 0.17 (L) 06/05/2015   FREET4 0.95 07/05/2016   FREET4 1.22 03/22/2016   FREET4 0.92 11/20/2015   FREET4 1.44 08/26/2015  03/31/2015: TSH 6.82, fT4 1.24 01/22/2015: TSH 0.420 11/28/2014: TSH 5.440  TPO ABs per [facility] records - ? Why checked since pt had thyroidectomy in 2000...:  03/31/2015: TPO Abs <6 10/03/2013: TPO Abs 7 07/02/2010: TPO Abs <6  Pt denies: - feeling nodules in neck - hoarseness - dysphagia - choking - SOB with lying down  She has + FH of thyroid disorders in mother and sister: hypothyroidism. No FH of thyroid cancer. No h/o radiation tx to head or neck.  No seaweed or kelp. No recent contrast studies. No herbal supplements. No Biotin use. No recent steroids use.   She also has a history of hysterectomy in 1994. Also, HL, GERD, depression, ruptured Achilles tendon 2015.  ROS: Constitutional: no weight gain/no weight loss, + fatigue, no subjective hyperthermia, no subjective  hypothermia Eyes: no blurry vision, no xerophthalmia ENT: no sore throat, no nodules palpated in throat, no dysphagia, no odynophagia, no hoarseness Cardiovascular: no CP/no SOB/no palpitations/no leg swelling Respiratory: no cough/no SOB/no wheezing Gastrointestinal: no N/no V/no D/no C/no acid reflux Musculoskeletal: no muscle aches/no joint aches Skin: no rashes, no hair loss Neurological: no tremors/no numbness/no tingling/no dizziness  I reviewed pt's medications, allergies, PMH, social hx, family hx, and changes were documented in the history of present illness. Otherwise, unchanged from my initial visit note.   Past Medical History:  Diagnosis Date  . Anxiety   . Confusion, hx of, without neuro findings   . Depression   . Hx of vertigo   . Hyperlipidemia   . Hypothyroid   . Left shoulder pain   . Migraines   . Neck pain   . Orthostatic hypotension   . Renal insufficiency    Past Surgical History:  Procedure Laterality Date  . ABDOMINAL HYSTERECTOMY    . ABDOMINAL HYSTERECTOMY    . BREAST LUMPECTOMY    . CHOLECYSTECTOMY    . JOINT REPLACEMENT    . THYROID SURGERY    . TONSILLECTOMY     Social History   Social History  . Marital status: Married    Spouse name: N/A  . Number of children: 2   Occupational History  .  Retired   [employer]  History Main Topics  . Smoking status: Never Smoker  . Smokeless tobacco: Never Used  . Alcohol use No  . Drug use: No   Current Outpatient Prescriptions on File Prior to Visit  Medication Sig Dispense Refill  . albuterol (PROVENTIL HFA;VENTOLIN HFA) 108 (90 Base) MCG/ACT inhaler Inhale 2 puffs into the lungs every 6 (six) hours as needed for wheezing or shortness of breath. 1 Inhaler 0  . buPROPion (WELLBUTRIN XL) 150 MG 24 hr tablet Take 1 tablet (150 mg total) by mouth daily. 30 tablet 5  . divalproex (DEPAKOTE) 500 MG DR tablet Take 1 tablet (500 mg total) by mouth daily. 30 tablet 3  . fluticasone (FLONASE) 50 MCG/ACT  nasal spray Place 2 sprays into both nostrils daily. 16 g 1  . fluticasone (FLOVENT HFA) 110 MCG/ACT inhaler Inhale 2 puffs into the lungs 2 (two) times daily. 1 Inhaler 1  . gabapentin (NEURONTIN) 300 MG capsule Take 2 capsules (600 mg total) by mouth 2 (two) times daily. 120 capsule 5  . Multiple Vitamins-Minerals (PRESERVISION AREDS 2) CAPS Take 2 capsules by mouth.    . phenazopyridine (PYRIDIUM) 100 MG tablet Take 1 tablet (100 mg total) by mouth 3 (three) times daily as needed for pain. 10 tablet 0  . sertraline (ZOLOFT) 100 MG tablet Take 1.5 tablets (150 mg total) by mouth daily. (Patient taking differently: Take 100 mg by mouth daily. )  0  . simvastatin (ZOCOR) 20 MG tablet Take 1 tablet (20 mg total) by mouth every evening. 30 tablet 1  . SUMAtriptan (IMITREX) 100 MG tablet TAKE 1 TABLET BY MOUTH MAY REPEAT IN 2 HOURS ONE TIME IF NEEDED 10 tablet 5  . SYNTHROID 125 MCG tablet Take 1 tablet (125 mcg total) by mouth daily before breakfast. 90 tablet 1  . zolpidem (AMBIEN) 10 MG tablet Take 0.5-1 tablets (5-10 mg total) by mouth at bedtime as needed. 15 tablet 3   No current facility-administered medications on file prior to visit.    No Known Allergies Family History  Problem Relation Age of Onset  . Heart disease Mother        Angina  . Cancer Mother   . Cancer Father   . Heart disease Father   . Cancer Maternal Aunt    PE: BP 122/72 (BP Location: Left Arm, Patient Position: Sitting)   Pulse 80   Wt 209 lb (94.8 kg)   SpO2 98%   BMI 32.73 kg/m  Wt Readings from Last 3 Encounters:  09/27/16 209 lb (94.8 kg)  08/25/16 204 lb 9.6 oz (92.8 kg)  07/05/16 210 lb 8 oz (95.5 kg)   Constitutional: overweight, in NAD, + looking tired Eyes: PERRLA, EOMI, no exophthalmos ENT: moist mucous membranes, healed thyroid scar, no cervical lymphadenopathy Cardiovascular: RRR, No MRG Respiratory: CTA B Gastrointestinal: abdomen soft, NT, ND, BS+ Musculoskeletal: no deformities, strength  intact in all 4 Skin: moist, warm, no rashes Neurological: no tremor with outstretched hands, DTR normal in all 4  ASSESSMENT: 1.  Postsurgical Hypothyroidism  PLAN:  1. Postsurgical Hypothyroidism - latest thyroid labs reviewed with pt >> normal  - she continues on Synthroid 125 mcg daily - pt feels good on this dose, would not want to change if possible - we discussed about taking the thyroid hormone every day, with water, >30 minutes before breakfast, separated by >4 hours from acid reflux medications, calcium, iron, multivitamins. Pt. is taking it correctly - will check thyroid tests today: TSH and fT4 -  If labs are abnormal, she will need to return for repeat TFTs in 1.5 months - RTC in 1 year  Component     Latest Ref Rng & Units 09/27/2016  TSH     0.35 - 4.50 uIU/mL 0.26 (L)  T4,Free(Direct)     0.60 - 1.60 ng/dL 7.82  TSH slightly low >> will need to decrease the LT4 dose to 112 mcg daily and recheck in 2 months.  Carlus Pavlov, MD PhD [facility]

## 2016-09-28 ENCOUNTER — Telehealth: Payer: Self-pay

## 2016-09-28 MED ORDER — SYNTHROID 112 MCG PO TABS: 112.0000 ug | ORAL_TABLET | Freq: Every day | ORAL | 1 refills | Status: DC

## 2016-09-28 NOTE — Telephone Encounter (Signed)
Called patient and gave lab results. Patient had no questions or concerns.  

## 2016-09-28 NOTE — Telephone Encounter (Signed)
-----   Message from Carlus Pavlovristina Gherghe, MD sent at 09/28/2016 12:30 PM EDT ----- Raynelle FanningJulie, can you please call pt: TSH slightly low >> will need to decrease the LT4 dose to 112 mcg daily and recheck in 2 months. Sent the new Rx and ordered the labs. She needs an appt with the lab for July.

## 2016-10-07 DIAGNOSIS — H524 Presbyopia: Secondary | ICD-10-CM | POA: Diagnosis not present

## 2016-10-07 DIAGNOSIS — H04123 Dry eye syndrome of bilateral lacrimal glands: Secondary | ICD-10-CM | POA: Diagnosis not present

## 2016-10-07 DIAGNOSIS — H353131 Nonexudative age-related macular degeneration, bilateral, early dry stage: Secondary | ICD-10-CM | POA: Diagnosis not present

## 2016-10-07 DIAGNOSIS — H5213 Myopia, bilateral: Secondary | ICD-10-CM | POA: Diagnosis not present

## 2016-11-30 ENCOUNTER — Other Ambulatory Visit (INDEPENDENT_AMBULATORY_CARE_PROVIDER_SITE_OTHER): Payer: Medicare Other

## 2016-11-30 DIAGNOSIS — E890 Postprocedural hypothyroidism: Secondary | ICD-10-CM

## 2016-11-30 LAB — T4, FREE: FREE T4: 0.99 ng/dL (ref 0.60–1.60)

## 2016-11-30 LAB — TSH: TSH: 0.45 u[IU]/mL (ref 0.35–4.50)

## 2016-12-01 ENCOUNTER — Telehealth: Payer: Self-pay

## 2016-12-01 NOTE — Telephone Encounter (Signed)
LVM, gave lab results. Gave call back number if any questions or concerns.  

## 2016-12-01 NOTE — Telephone Encounter (Signed)
-----   Message from Carlus Pavlovristina Gherghe, MD sent at 11/30/2016  5:23 PM EDT ----- Raynelle FanningJulie, can you please call pt: TFTs normal >> please continue current dose of levothyroxine.

## 2016-12-10 ENCOUNTER — Other Ambulatory Visit: Payer: Self-pay

## 2017-01-06 ENCOUNTER — Other Ambulatory Visit: Payer: Self-pay

## 2017-01-06 MED ORDER — SUMATRIPTAN SUCCINATE 100 MG PO TABS: ORAL_TABLET | ORAL | 5 refills | Status: DC

## 2017-01-06 MED ORDER — BUPROPION HCL ER (XL) 150 MG PO TB24: 150.0000 mg | ORAL_TABLET | Freq: Every day | ORAL | 0 refills | Status: DC

## 2017-01-06 NOTE — Telephone Encounter (Signed)
Faxed 30d of Bupropion with note for Pharm to advise pt to sched F/U/thx dmf

## 2017-01-11 ENCOUNTER — Ambulatory Visit: Payer: Medicare Other | Admitting: Medical

## 2017-01-11 ENCOUNTER — Telehealth: Payer: Self-pay | Admitting: Medical

## 2017-01-11 NOTE — Telephone Encounter (Signed)
Pt called in to reschedule her apt for today. She said that she is unable to make her apt. Something came up. Pt rescheduled apt til tomorrow.

## 2017-01-12 ENCOUNTER — Other Ambulatory Visit: Payer: Self-pay

## 2017-01-12 ENCOUNTER — Encounter: Payer: Self-pay | Admitting: Medical

## 2017-01-12 ENCOUNTER — Ambulatory Visit (INDEPENDENT_AMBULATORY_CARE_PROVIDER_SITE_OTHER): Payer: Medicare Other | Admitting: Medical

## 2017-01-12 VITALS — BP 96/60 | HR 89 | Temp 98.5°F | Resp 16 | Ht 67.0 in | Wt 209.2 lb

## 2017-01-12 DIAGNOSIS — R531 Weakness: Secondary | ICD-10-CM

## 2017-01-12 DIAGNOSIS — I959 Hypotension, unspecified: Secondary | ICD-10-CM

## 2017-01-12 LAB — CBC WITH DIFFERENTIAL/PLATELET
BASOS PCT: 0.9 % (ref 0.0–3.0)
Basophils Absolute: 0 10*3/uL (ref 0.0–0.1)
EOS ABS: 0.2 10*3/uL (ref 0.0–0.7)
EOS PCT: 4.7 % (ref 0.0–5.0)
HEMATOCRIT: 40 % (ref 36.0–46.0)
HEMOGLOBIN: 12.9 g/dL (ref 12.0–15.0)
Lymphocytes Relative: 36 % (ref 12.0–46.0)
Lymphs Abs: 1.7 10*3/uL (ref 0.7–4.0)
MCHC: 32.1 g/dL (ref 30.0–36.0)
MCV: 91.3 fl (ref 78.0–100.0)
MONO ABS: 0.5 10*3/uL (ref 0.1–1.0)
Monocytes Relative: 9.6 % (ref 3.0–12.0)
NEUTROS ABS: 2.4 10*3/uL (ref 1.4–7.7)
Neutrophils Relative %: 48.8 % (ref 43.0–77.0)
PLATELETS: 231 10*3/uL (ref 150.0–400.0)
RBC: 4.38 Mil/uL (ref 3.87–5.11)
RDW: 14.4 % (ref 11.5–15.5)
WBC: 4.8 10*3/uL (ref 4.0–10.5)

## 2017-01-12 LAB — COMPREHENSIVE METABOLIC PANEL
ALT: 8 U/L (ref 0–35)
AST: 9 U/L (ref 0–37)
Albumin: 3.7 g/dL (ref 3.5–5.2)
Alkaline Phosphatase: 69 U/L (ref 39–117)
BUN: 17 mg/dL (ref 6–23)
CHLORIDE: 108 meq/L (ref 96–112)
CO2: 30 meq/L (ref 19–32)
CREATININE: 0.93 mg/dL (ref 0.40–1.20)
Calcium: 8.9 mg/dL (ref 8.4–10.5)
GFR: 62.28 mL/min (ref >60.00)
Glucose, Bld: 79 mg/dL (ref 70–99)
POTASSIUM: 4 meq/L (ref 3.5–5.1)
SODIUM: 142 meq/L (ref 135–145)
Total Bilirubin: 0.4 mg/dL (ref 0.2–1.2)
Total Protein: 6.1 g/dL (ref 6.0–8.3)

## 2017-01-12 MED ORDER — MIDODRINE HCL 2.5 MG PO TABS: 2.5000 mg | ORAL_TABLET | Freq: Two times a day (BID) | ORAL | 0 refills | Status: DC

## 2017-01-12 NOTE — Progress Notes (Signed)
   Subjective:    Patient ID: Mallory Durham, female    DOB: 1940/09/26, 76 y.o.   MRN: [id number]  HPI  Pt in for follow up.  Pt has had in the past some low bp levels. Some at times trouble at times when she stands. She would feel dizzy and light headed. Pt bp have varied in past some on low side others but other times was in good range. Last time her checked her bp was ok.    No falls or injury since last visit. No use of bp meds. No chronic dehydration. On last labs no anemia seen on labs done 6 months ago.  Pt husband now at [employer].  Last time I saw her in the past and often her bp came error reading(Possible readings since blood pressure was too low?). The highest she every got was 119 systolic.  Pt states for years her bp seemed to be on lower side but worse recently  Review of Systems  Constitutional: Negative for chills, fatigue and fever.  Respiratory: Negative for cough, chest tightness and wheezing.   Cardiovascular: Negative for chest pain and palpitations.  Genitourinary: Negative for difficulty urinating, dysuria, flank pain, frequency and urgency.  Musculoskeletal: Negative for back pain, gait problem and joint swelling.  Skin: Negative for rash.  Neurological: Negative for dizziness, speech difficulty, weakness, light-headedness and headaches.       At times feels weak and lightheaded when she stands quickly. She will rest and then the symptoms subside.  Hematological: Does not bruise/bleed easily.  Psychiatric/Behavioral: Negative for behavioral problems, confusion, sleep disturbance and suicidal ideas. The patient is not nervous/anxious.        Some stress regarding her husband's health.       Objective:   Physical Exam  General Mental Status- Alert. General Appearance- Not in acute distress.   Skin General: Color- Normal Color. Moisture- Normal Moisture.  Neck Carotid Arteries- Normal color. Moisture- Normal Moisture. No carotid bruits. No  JVD.  Chest and Lung Exam Auscultation: Breath Sounds:-Normal.  Cardiovascular Auscultation:Rythm- Regular. Murmurs & Other Heart Sounds:Auscultation of the heart reveals- No Murmurs.  Abdomen Inspection:-Inspeection Normal. Palpation/Percussion:Note:No mass. Palpation and Percussion of the abdomen reveal- Non Tender, Non Distended + BS, no rebound or guarding.    Neurologic Cranial Nerve exam:- CN III-XII intact(No nystagmus), symmetric smile. Strength:- 5/5 equal and symmetric strength both upper and lower extremities.      Assessment & Plan:  Your blood pressure has been low recently and in the past. More so on standing so please be cautious and get balance for ambulating.  I want to do labs today to assess/make sure you're not anemic and that your labs don't show indication of dehydration.  If both labs look normal then I did send prescription to your pharmacy for Midodrine. This can help stabilize/prevent low blood pressure. If you do start this medication then check blood pressures twice daily. Document readings. If any blood pressure readings above 140/90 let us know.  Follow-up in 2 weeks to recheck her blood pressure in the office and review your blood pressure logbook. Follow-up as needed as well.  Recommend you try to get a 1:00 appointment in the afternoon to be the first patient.

## 2017-01-12 NOTE — Patient Instructions (Signed)
Your blood pressure has been low recently and in the past. More so on standing so please be cautious and get balance for ambulating.  I want to do labs today to assess/make sure you're not anemic and that your labs don't show indication of dehydration.  If both labs look normal then I did send prescription to your pharmacy for Midodrine. This can help stabilize/prevent low blood pressure. If you do start this medication then check blood pressures twice daily. Document readings. If any blood pressure readings above 140/90 let us know.  Follow-up in 2 weeks to recheck her blood pressure in the office and review your blood pressure logbook. Follow-up as needed as well.  Recommend you try to get a 1:00 appointment in the afternoon to be the first patient.

## 2017-01-18 ENCOUNTER — Ambulatory Visit: Payer: Medicare Other | Admitting: Medical

## 2017-02-02 ENCOUNTER — Ambulatory Visit (INDEPENDENT_AMBULATORY_CARE_PROVIDER_SITE_OTHER): Payer: Medicare Other | Admitting: Medical

## 2017-02-02 ENCOUNTER — Ambulatory Visit (HOSPITAL_BASED_OUTPATIENT_CLINIC_OR_DEPARTMENT_OTHER)
Admission: RE | Admit: 2017-02-02 | Discharge: 2017-02-02 | Disposition: A | Discharge status: Home / Self Care | Payer: Medicare Other | Source: Ambulatory Visit | Attending: Medical | Admitting: Medical

## 2017-02-02 ENCOUNTER — Encounter: Payer: Self-pay | Admitting: Medical

## 2017-02-02 VITALS — BP 110/82 | HR 76 | Temp 98.4°F | Resp 16 | Ht 67.0 in | Wt 207.8 lb

## 2017-02-02 DIAGNOSIS — R0600 Dyspnea, unspecified: Secondary | ICD-10-CM | POA: Diagnosis not present

## 2017-02-02 DIAGNOSIS — I959 Hypotension, unspecified: Secondary | ICD-10-CM

## 2017-02-02 LAB — BRAIN NATRIURETIC PEPTIDE: PRO B NATRI PEPTIDE: 246 pg/mL — AB (ref 0.0–100.0)

## 2017-02-02 NOTE — Progress Notes (Signed)
Subjective:    Patient ID: Mallory Durham, female    DOB: 26-Oct-1940, 76 y.o.   MRN: [id number]  HPI   Pt in for follow up.  Pt gives me update that she moved close to her daughter and cheaper than her former apartments. Still visiting husband 6 days a week.  Pt at rest she feels good. But when she is active and doing things she feels tired. Feels weak at times/light headed.  Pt did not check her bp daily since she was moving.   Pt in past was complaining of low blood pressures and weak when standing. Her bp last visit was 84/44 and when I checked was 96/60. Now she states since midodrine she does not feel weak or light headed on standing as before. But does feel weak after activity  Pt bp readings 01-16-2017 were 125/53. 01-18-2017 132/58 and then 126/74.   Then on 01-30-2017 109/53. Then she states got back readings from memory but not sure of date 105/58, 103/83, 103/40 and 106/74and  116/77  On further discussion. When pt is active no wheezing. But does feel out of breath.  06-2016 ekg looked good.  No history of smoking. Second hand smoke from her dad. Dad smoked in house and car. In past when gets sick with bronchitis or colds will wheeze.   Review of Systems  Constitutional: Positive for fatigue. Negative for chills and fever.  Respiratory: Negative for cough, chest tightness, shortness of breath and wheezing.   Cardiovascular: Negative for chest pain and palpitations.  Musculoskeletal: Negative for back pain and gait problem.  Neurological: Negative for dizziness, seizures, weakness and headaches.  Hematological: Negative for adenopathy. Does not bruise/bleed easily.  Psychiatric/Behavioral: Negative for behavioral problems and confusion.   Past Medical History:  Diagnosis Date  . Anxiety   . Confusion, hx of, without neuro findings   . Depression   . Hx of vertigo   . Hyperlipidemia   . Hypothyroid   . Left shoulder pain   . Migraines   . Neck pain   . Orthostatic  hypotension   . Renal insufficiency      Social History   Social History  . Marital status: Married    Spouse name: N/A  . Number of children: 2  . Years of education: N/A   Occupational History  .  Retired   Social History Main Topics  . Smoking status: Never Smoker  . Smokeless tobacco: Never Used  . Alcohol use No  . Drug use: No  . Sexual activity: Not on file   Other Topics Concern  . Not on file   Social History Narrative  . No narrative on file    Past Surgical History:  Procedure Laterality Date  . ABDOMINAL HYSTERECTOMY    . ABDOMINAL HYSTERECTOMY    . BREAST LUMPECTOMY    . CHOLECYSTECTOMY    . JOINT REPLACEMENT    . THYROID SURGERY    . TONSILLECTOMY      Family History  Problem Relation Age of Onset  . Heart disease Mother        Angina  . Cancer Mother   . Cancer Father   . Heart disease Father   . Cancer Maternal Aunt     No Known Allergies  Current Outpatient Prescriptions on File Prior to Visit  Medication Sig Dispense Refill  . albuterol (PROVENTIL HFA;VENTOLIN HFA) 108 (90 Base) MCG/ACT inhaler Inhale 2 puffs into the lungs every 6 (six) hours as needed for  wheezing or shortness of breath. 1 Inhaler 0  . buPROPion (WELLBUTRIN XL) 150 MG 24 hr tablet Take 1 tablet (150 mg total) by mouth daily. 30 tablet 0  . divalproex (DEPAKOTE) 500 MG DR tablet Take 1 tablet (500 mg total) by mouth daily. 30 tablet 3  . fluticasone (FLONASE) 50 MCG/ACT nasal spray Place 2 sprays into both nostrils daily. 16 g 1  . fluticasone (FLOVENT HFA) 110 MCG/ACT inhaler Inhale 2 puffs into the lungs 2 (two) times daily. 1 Inhaler 1  . gabapentin (NEURONTIN) 300 MG capsule Take 2 capsules (600 mg total) by mouth 2 (two) times daily. 120 capsule 5  . midodrine (PROAMATINE) 2.5 MG tablet Take 1 tablet (2.5 mg total) by mouth 2 (two) times daily with a meal. 60 tablet 0  . Multiple Vitamins-Minerals (PRESERVISION AREDS 2) CAPS Take 2 capsules by mouth.    .  phenazopyridine (PYRIDIUM) 100 MG tablet Take 1 tablet (100 mg total) by mouth 3 (three) times daily as needed for pain. 10 tablet 0  . sertraline (ZOLOFT) 100 MG tablet Take 1.5 tablets (150 mg total) by mouth daily. (Patient taking differently: Take 100 mg by mouth daily. )  0  . simvastatin (ZOCOR) 20 MG tablet Take 1 tablet (20 mg total) by mouth every evening. 30 tablet 1  . SUMAtriptan (IMITREX) 100 MG tablet TAKE 1 TABLET BY MOUTH MAY REPEAT IN 2 HOURS ONE TIME IF NEEDED 10 tablet 5  . SYNTHROID 112 MCG tablet Take 1 tablet (112 mcg total) by mouth daily before breakfast. 60 tablet 1  . zolpidem (AMBIEN) 10 MG tablet Take 0.5-1 tablets (5-10 mg total) by mouth at bedtime as needed. 15 tablet 3   No current facility-administered medications on file prior to visit.     BP 110/82   Pulse 76   Temp 98.4 F (36.9 C) (Oral)   Resp 16   Ht  (1.702 m)   Wt 207 lb 12.8 oz (94.3 kg)   SpO2 99%   BMI 32.55 kg/m      Objective:   Physical Exam   General Mental Status- Alert. General Appearance- Not in acute distress.   Skin General: Color- Normal Color. Moisture- Normal Moisture.  Neck Carotid Arteries- Normal color. Moisture- Normal Moisture. No carotid bruits. No JVD.  Chest and Lung Exam Auscultation: Breath Sounds:-Normal.  Cardiovascular Auscultation:Rythm- Regular. Murmurs & Other Heart Sounds:Auscultation of the heart reveals- No Murmurs.  Abdomen Inspection:-Inspeection Normal. Palpation/Percussion:Note:No mass. Palpation and Percussion of the abdomen reveal- Non Tender, Non Distended + BS, no rebound or guarding.   Neurologic Cranial Nerve exam:- CN III-XII intact(No nystagmus), symmetric smile. Strength:- 5/5 equal and symmetric strength both upper and lower extremities.  Lower ext- mild 1+ pedal edema. But symmetric. No swelling. Negative homans signs.    Assessment & Plan:  For your history of low blood pressure, I want you to check your blood  pressure daily for the next 5 days and you me up-to-date on those readings. Your blood pressure is a little on the low side but historically her blood pressures have always been low. On review appear to be about 20 points higher on both systolic and diastolic. On recent labs anemia and dehydration did not appear to be the cause. Overall your blood pressure since starting midodrine has improved some. Also some encouraged that she no longer have dizziness on standing. He might increase dose of midodrine in near future. Want to review readings of blood pressure first and  might consult with one of MDs will work with.  For your dyspnea on exertion recently we will repeat EKG which looked good in the past and will compared to see if there is any significant change. Also will get a chest x-ray and BNP.   I am recommending that she use your albuterol 2 puffs prior to activity or as needed when necessary shortness of breath to see if this helps your recent shortness of breath with activity. With your history of secondhand smoke exposure and wheezing when you get bronchitis, I am thinking that your airways right be tight recently with a very hot humid air. Please let us know how you respond to albuterol.  Follow-up in 7-10 days or as needed.  EKG today did show sinus rhythm. No acute changes from previous EKG. Almost looked identical on review.  Shondra Capps, Ramon Dredge, PA-C

## 2017-02-02 NOTE — Patient Instructions (Addendum)
For your history of low blood pressure, I want you to check your blood pressure daily for the next 5 days and up-to-date me  on those readings. Your blood pressure is a little on the low side but historically blood pressures have always been low side. On review appear to be about 20 points higher on both systolic and diastolic in past. On recent labs anemia and dehydration did not appear to be the cause. Overall your blood pressure since starting midodrine has improved some. Also some encouraged that you  no longer have dizziness on standing. I might increase dose of midodrine in near future. Want to review readings of blood pressure first and might consult with one of MDs will work with.  For your dyspnea on exertion recently we will repeat EKG which looked good in the past and will compared to see if there is any significant change. Also will get a chest x-ray and BNP.   I am recommending that you  use your albuterol 2 puffs prior to activity or as needed when necessary shortness of breath to see if this helps your recent shortness of breath with activity. With your history of secondhand smoke exposure and wheezing when you get bronchitis, I am thinking that your airways right be tight recently with a very hot humid air. Please let us know how you respond to albuterol.  Follow-up in 7-10 days or as needed.

## 2017-02-03 ENCOUNTER — Other Ambulatory Visit: Payer: Self-pay | Admitting: Internal Medicine

## 2017-02-04 ENCOUNTER — Other Ambulatory Visit: Payer: Self-pay

## 2017-02-04 MED ORDER — BUPROPION HCL ER (XL) 150 MG PO TB24: 150.0000 mg | ORAL_TABLET | Freq: Every day | ORAL | 0 refills | Status: DC

## 2017-02-08 ENCOUNTER — Telehealth: Payer: Self-pay | Admitting: *Deleted

## 2017-02-08 NOTE — Telephone Encounter (Signed)
Spoke with pt. States she has been lightheaded and not steady on her feet. States she was ok as long as she sat or lay down. She denies extremity weakness, speech difficulty or confusion. Reports that she has not had any dizziness the last 2 days. States dizziness felt different than her normal dizziness that she has.  Was able to ambulate today with no shortness of breath. Scheduled pt appt for Friday at 1:30pm and advised pt to call is if dizziness returns. Pt voices understanding.

## 2017-02-08 NOTE — Telephone Encounter (Signed)
[name] Pt called and I spoke with her about below results. She states that she has not had to use the inhaler as she has been sick the last 3 days and been lying in bed therefore has not had any shortness of breath. Thinks she was only supposed to use the inhaler when she became short of breath. Wants to know what BNP was checking for and why would we not be concerned with her elevated result?  I did read her your response below about not being clinically significant at this time but is there anything else you could explain or would advise?   Notes recorded by Esperanza Richters, PA-C on 02/02/2017 at 6:55 PM EDT Patient has very low level elevation of BNP. Her level was 246 and upper limit is 100. I explained to her that mild elevations often not clinically significant. I need to review the chest x-ray and see what that shows. If her lungs are clear and she has no heart enlargement then I would not make a big deal of this minimal elevation. I explained to her that typically BNP is above 800 are often clinically significant. Please remind her to try the inhalers see if this helps some of her dyspnea on exertion.  Notes recorded by Esperanza Richters, PA-C on 02/03/2017 at 9:21 AM EDT On patient's x-ray there is no infiltrate or effusion. The heart size is normal. But on x-ray it does note some mild hyperinflation of the lungs. Hyperinflation is often seen with prior smoke exposure. She has history of smoke exposure secondhand when she was younger. I checked her last x-ray in this was not reported. So it is a different read. So I do want to see what her clinical response is to the albuterol inhaler as I discussed on the visit.

## 2017-02-08 NOTE — Telephone Encounter (Signed)
If she has been sick recently would you offer her appointment at Friday 1 PM. I could explain the BNP and reason for doing the tests again.   The albuterol was intended to see if her dyspnea on exertion decreased with albuterol use.  Since I don't know just how sick she's has been would you have frontal staff make a 1 PM appointment on Friday 30 minute appointment for tomorrow 1:15 PM  Also would you mind asking her what has been going on? What signs and symptoms that she have questions?

## 2017-02-11 ENCOUNTER — Encounter: Payer: Self-pay | Admitting: Medical

## 2017-02-11 ENCOUNTER — Ambulatory Visit (INDEPENDENT_AMBULATORY_CARE_PROVIDER_SITE_OTHER): Payer: Medicare Other | Admitting: Medical

## 2017-02-11 VITALS — BP 118/68 | HR 98 | Temp 97.6°F | Resp 16 | Ht 67.0 in | Wt 207.4 lb

## 2017-02-11 DIAGNOSIS — R42 Dizziness and giddiness: Secondary | ICD-10-CM | POA: Diagnosis not present

## 2017-02-11 DIAGNOSIS — I950 Idiopathic hypotension: Secondary | ICD-10-CM

## 2017-02-11 DIAGNOSIS — R0600 Dyspnea, unspecified: Secondary | ICD-10-CM

## 2017-02-11 DIAGNOSIS — R269 Unspecified abnormalities of gait and mobility: Secondary | ICD-10-CM

## 2017-02-11 MED ORDER — MIDODRINE HCL 2.5 MG PO TABS: 2.5000 mg | ORAL_TABLET | Freq: Two times a day (BID) | ORAL | 0 refills | Status: DC

## 2017-02-11 NOTE — Patient Instructions (Addendum)
Your blood pressure has increased with midrodine. Your recent numbers I think are overall good. As long as you fee lfine I  think you can start to check your blood pressures every other day. Will refill the  midrodine today.  Your gait disturbance/lightheadedness sensation has resolved. Your neurologic exam is good today. I do not see benefit of CT of head presently. If you do have recurrent gait issues or light headedness please let me know.  For your shortness of breath on exertion, I do want you to try the inhaler albuterol and let me know if you see improvement.  If you don't have improvement then would have to reconsider possible studies such as repeat PFTs or possible echocardiogram  Follow-up in one month or as needed.

## 2017-02-11 NOTE — Progress Notes (Signed)
Subjective:    Patient ID: Mallory Durham, female    DOB: 05/01/1941, 76 y.o.   MRN: [id number]  HPI  Pt in for follow up.  Pt states the other day she was very unsteady on her feet and layed in bed a lot but denies feeling real sick on review. After 3 days she reports her unsteady sensation did improve. Now feeling fine without any gait problems or feeling unsteady.   Pt bp readings have been oldest to newest.   BP was 124/50, 130/87, 113/42, 129/42, 138/67, 125/46, 109/53, and 103/83.   Pt states unsteadiness to other day was not related to her blood pressure. She thought not related to BP. She was checking her bp frequently.   Pt is still on midrodine. I did not increase dose on last visit.  Light headed and sometimes gait disturbance and runs into door frames. Only for 3 days(this was over weekend into Tuesday). Then got better.   Pt had some dyspnea on exertion on last visit(see description) and she never tried the inhaler. See last note.  cxr normal. bnp not significantly elevated. Prior echo good  In 2013. Also years ago pt had PFT and she states was good. Pt states maybe 10 years.  Pt complains a lot today states front office is unprofessional.    Review of Systems  Constitutional: Negative for chills, fatigue and fever.  Respiratory: Negative for cough, chest tightness, shortness of breath and wheezing.        None noted recently but has not been really active recently.  Cardiovascular: Negative for chest pain and palpitations.  Gastrointestinal: Negative for abdominal pain.  Genitourinary: Negative for difficulty urinating, dysuria, enuresis, frequency and urgency.  Musculoskeletal: Negative for back pain.  Neurological: Negative for dizziness, syncope, weakness, light-headedness and headaches.       No imbalance or gait disturbance presently. No light headed sensation.  Hematological: Negative for adenopathy. Does not bruise/bleed easily.  Psychiatric/Behavioral:  Negative for behavioral problems, confusion, decreased concentration, dysphoric mood and hallucinations. The patient is not nervous/anxious.     Past Medical History:  Diagnosis Date  . Anxiety   . Confusion, hx of, without neuro findings   . Depression   . Hx of vertigo   . Hyperlipidemia   . Hypothyroid   . Left shoulder pain   . Migraines   . Neck pain   . Orthostatic hypotension   . Renal insufficiency      Social History   Social History  . Marital status: Married    Spouse name: N/A  . Number of children: 2  . Years of education: N/A   Occupational History  .  Retired   Social History Main Topics  . Smoking status: Never Smoker  . Smokeless tobacco: Never Used  . Alcohol use No  . Drug use: No  . Sexual activity: Not on file   Other Topics Concern  . Not on file   Social History Narrative  . No narrative on file    Past Surgical History:  Procedure Laterality Date  . ABDOMINAL HYSTERECTOMY    . ABDOMINAL HYSTERECTOMY    . BREAST LUMPECTOMY    . CHOLECYSTECTOMY    . JOINT REPLACEMENT    . THYROID SURGERY    . TONSILLECTOMY      Family History  Problem Relation Age of Onset  . Heart disease Mother        Angina  . Cancer Mother   . Cancer Father   .  Heart disease Father   . Cancer Maternal Aunt     No Known Allergies  Current Outpatient Prescriptions on File Prior to Visit  Medication Sig Dispense Refill  . albuterol (PROVENTIL HFA;VENTOLIN HFA) 108 (90 Base) MCG/ACT inhaler Inhale 2 puffs into the lungs every 6 (six) hours as needed for wheezing or shortness of breath. 1 Inhaler 0  . buPROPion (WELLBUTRIN XL) 150 MG 24 hr tablet Take 1 tablet (150 mg total) by mouth daily. 30 tablet 0  . divalproex (DEPAKOTE) 500 MG DR tablet Take 1 tablet (500 mg total) by mouth daily. 30 tablet 3  . fluticasone (FLONASE) 50 MCG/ACT nasal spray Place 2 sprays into both nostrils daily. 16 g 1  . fluticasone (FLOVENT HFA) 110 MCG/ACT inhaler Inhale 2 puffs  into the lungs 2 (two) times daily. 1 Inhaler 1  . gabapentin (NEURONTIN) 300 MG capsule Take 2 capsules (600 mg total) by mouth 2 (two) times daily. 120 capsule 5  . Multiple Vitamins-Minerals (PRESERVISION AREDS 2) CAPS Take 2 capsules by mouth.    . phenazopyridine (PYRIDIUM) 100 MG tablet Take 1 tablet (100 mg total) by mouth 3 (three) times daily as needed for pain. 10 tablet 0  . sertraline (ZOLOFT) 100 MG tablet Take 1.5 tablets (150 mg total) by mouth daily. (Patient taking differently: Take 100 mg by mouth daily. )  0  . simvastatin (ZOCOR) 20 MG tablet Take 1 tablet (20 mg total) by mouth every evening. 30 tablet 1  . SUMAtriptan (IMITREX) 100 MG tablet TAKE 1 TABLET BY MOUTH MAY REPEAT IN 2 HOURS ONE TIME IF NEEDED 10 tablet 5  . SYNTHROID 112 MCG tablet TAKE ONE (1) TABLET EACH DAY BEFORE BREAKFAST 60 tablet 1  . zolpidem (AMBIEN) 10 MG tablet Take 0.5-1 tablets (5-10 mg total) by mouth at bedtime as needed. 15 tablet 3   No current facility-administered medications on file prior to visit.     BP 118/68   Pulse 98   Temp 97.6 F (36.4 C) (Oral)   Resp 16   Ht  (1.702 m)   Wt 207 lb 6.4 oz (94.1 kg)   SpO2 99%   BMI 32.48 kg/m       Objective:   Physical Exam  General Mental Status- Alert. General Appearance- Not in acute distress.   Skin General: Color- Normal Color. Moisture- Normal Moisture.  Neck Carotid Arteries- Normal color. Moisture- Normal Moisture. No carotid bruits. No JVD.  Chest and Lung Exam Auscultation: Breath Sounds:-Normal.  Cardiovascular Auscultation:Rythm- Regular. Murmurs & Other Heart Sounds:Auscultation of the heart reveals- No Murmurs.  Abdomen Inspection:-Inspeection Normal. Palpation/Percussion:Note:No mass. Palpation and Percussion of the abdomen reveal- Non Tender, Non Distended + BS, no rebound or guarding.    Neurologic Cranial Nerve exam:- CN III-XII intact(No nystagmus), symmetric smile. Drift Test:- No  drift. Finger to Nose:- Normal/Intact Strength:- 5/5 equal and symmetric strength both upper and lower extremities.  Demonstrates/walks today down hall without no problem or gait issues.      Assessment & Plan:  Your blood pressure has increased with midrodine. Your recent numbers I think are overall good. As long as you fee lfine I  think you can start to check your blood pressures every other day. Will refill the  midrodine today.  Your gait disturbance/lightheadedness sensation has resolved. Your neurologic exam is good today. I do not see benefit of CT of head presently. If you do have recurrent gait issues or light headedness please let me know.  For your shortness of breath on exertion, I do want you to try the inhaler albuterol and let me know if you see improvement.  If you don't have improvement then would have to reconsider possible studies such as repeat PFTs or possible echocardiogram  Follow-up in one month or as needed.  Alberto Pina, Ramon Dredge, PA-C

## 2017-02-15 ENCOUNTER — Other Ambulatory Visit: Payer: Self-pay

## 2017-02-15 MED ORDER — GABAPENTIN 300 MG PO CAPS: 600.0000 mg | ORAL_CAPSULE | Freq: Two times a day (BID) | ORAL | 0 refills | Status: DC

## 2017-02-15 NOTE — Telephone Encounter (Signed)
Rx req via pharm fax for Gabapentin/faxed 30d with note for pt to sched appt/per last visit advised to F/U in 1 month/thx dmf

## 2017-03-01 ENCOUNTER — Telehealth: Payer: Self-pay | Admitting: Medical

## 2017-03-01 MED FILL — SUMATRIPTAN SUCC 100 MG TAB: 100 | 10 days supply | Qty: 10 | Fill #0

## 2017-03-01 NOTE — Telephone Encounter (Signed)
Mallory Durham Self [phone]  [facility] -- Wants this to be primary.  midodrine (PROAMATINE) 2.5 MG tablet   [name] needs a new prescription sent to [facility].

## 2017-03-02 MED ORDER — MIDODRINE HCL 2.5 MG PO TABS: 2.5000 mg | ORAL_TABLET | Freq: Two times a day (BID) | ORAL | 0 refills | Status: DC

## 2017-03-02 MED FILL — MIDODRINE HCL 2.5 MG TABLET: 2.5 | 30 days supply | Qty: 60 | Fill #0

## 2017-03-02 NOTE — Telephone Encounter (Signed)
Rx sent to pharmacy   

## 2017-03-07 ENCOUNTER — Telehealth: Payer: Self-pay

## 2017-03-07 ENCOUNTER — Telehealth: Payer: Self-pay | Admitting: Medical

## 2017-03-07 DIAGNOSIS — R269 Unspecified abnormalities of gait and mobility: Secondary | ICD-10-CM

## 2017-03-07 DIAGNOSIS — E785 Hyperlipidemia, unspecified: Secondary | ICD-10-CM

## 2017-03-07 MED ORDER — SIMVASTATIN 20 MG PO TABS: 20.0000 mg | ORAL_TABLET | Freq: Every evening | ORAL | 1 refills | Status: DC

## 2017-03-07 NOTE — Telephone Encounter (Signed)
Pt states no better and wants referral to [facility].

## 2017-03-07 NOTE — Telephone Encounter (Signed)
Pt requesting Simvastatin last rx 02/01/16. Please advise.

## 2017-03-07 NOTE — Telephone Encounter (Signed)
Refilled her simvastatin. But also would advise she can come in fasting for lipid panel. If will place future order.

## 2017-03-07 NOTE — Telephone Encounter (Signed)
Referral to neurologist placed. 

## 2017-03-08 NOTE — Telephone Encounter (Signed)
Left pt a message to call back. 

## 2017-03-10 NOTE — Telephone Encounter (Signed)
Notified pt. Pt has lab appointment 03/15/17

## 2017-03-14 ENCOUNTER — Telehealth: Payer: Self-pay

## 2017-03-14 DIAGNOSIS — R399 Unspecified symptoms and signs involving the genitourinary system: Secondary | ICD-10-CM | POA: Diagnosis not present

## 2017-03-14 DIAGNOSIS — R81 Glycosuria: Secondary | ICD-10-CM | POA: Diagnosis not present

## 2017-03-14 DIAGNOSIS — N3001 Acute cystitis with hematuria: Secondary | ICD-10-CM | POA: Diagnosis not present

## 2017-03-14 MED ORDER — ZOLPIDEM TARTRATE 10 MG PO TABS: 5.0000 mg | ORAL_TABLET | Freq: Every evening | ORAL | 3 refills | Status: DC | PRN

## 2017-03-14 MED FILL — ZOLPIDEM TARTRATE 10 MG TAB: 10 | 15 days supply | Qty: 15 | Fill #0

## 2017-03-14 MED FILL — AMOX-CLAV 875-125 MG TABLET: 875-125 | 7 days supply | Qty: 14 | Fill #0

## 2017-03-14 NOTE — Telephone Encounter (Signed)
Refill Request: Zolpidem  Last RX:09/16/16 Last OV:02/11/17 Next FA:OZHYOV:None scheduled  QMV:HQIODS:none on file  CSC:03/31/16

## 2017-03-14 NOTE — Telephone Encounter (Signed)
I reviewed the request and signed a prescription for Ambien

## 2017-03-15 ENCOUNTER — Ambulatory Visit: Payer: Medicare Other

## 2017-03-15 ENCOUNTER — Other Ambulatory Visit: Payer: Medicare Other

## 2017-03-16 ENCOUNTER — Telehealth: Payer: Self-pay | Admitting: Neurology

## 2017-03-16 NOTE — Telephone Encounter (Signed)
It is oK

## 2017-03-16 NOTE — Telephone Encounter (Signed)
This patient last saw you in 2014. They are being referred back to [facility] and wants to see Dr Terrace ArabiaYan. Is it ok to switch ? dg

## 2017-03-16 NOTE — Telephone Encounter (Signed)
This patient last saw Dr Anne HahnWillis in 2014 and they are now being referred back to [facility]. They want to see you. Is it ok to switch? dg

## 2017-03-16 NOTE — Telephone Encounter (Signed)
Okay for switch to Dr. Terrace ArabiaYan.

## 2017-03-22 DIAGNOSIS — Z23 Encounter for immunization: Secondary | ICD-10-CM | POA: Diagnosis not present

## 2017-03-22 DIAGNOSIS — G43709 Chronic migraine without aura, not intractable, without status migrainosus: Secondary | ICD-10-CM | POA: Diagnosis not present

## 2017-03-22 DIAGNOSIS — Z7689 Persons encountering health services in other specified circumstances: Secondary | ICD-10-CM | POA: Diagnosis not present

## 2017-03-22 DIAGNOSIS — R42 Dizziness and giddiness: Secondary | ICD-10-CM | POA: Diagnosis not present

## 2017-03-22 DIAGNOSIS — R300 Dysuria: Secondary | ICD-10-CM | POA: Diagnosis not present

## 2017-03-22 MED FILL — BUPROPION HCL XL 150 MG TAB: 150 | 30 days supply | Qty: 30 | Fill #0

## 2017-03-22 MED FILL — FLUCONAZOLE 150 MG TABLET: 150 | 2 days supply | Qty: 2 | Fill #0

## 2017-03-22 MED FILL — GABAPENTIN 600 MG TABS: 600 | 30 days supply | Qty: 60 | Fill #0

## 2017-03-22 MED FILL — SERTRALINE HCL 100 MG TAB: 100 | 30 days supply | Qty: 45 | Fill #0

## 2017-03-22 MED FILL — SUMATRIPTAN SUCC 100 MG TAB: 100 | 30 days supply | Qty: 30 | Fill #0

## 2017-04-06 MED FILL — DIVALPROEX SOD 500 MG TAB D: 500 | 30 days supply | Qty: 30 | Fill #0

## 2017-04-06 MED FILL — SYNTHROID 112 MCG TABLET: 112 | 60 days supply | Qty: 60 | Fill #0

## 2017-04-11 MED FILL — ZOLPIDEM TARTRATE 10 MG TAB: 10 | 15 days supply | Qty: 15 | Fill #1

## 2017-05-05 MED FILL — SERTRALINE HCL 100 MG TAB: 100 | 30 days supply | Qty: 45 | Fill #1

## 2017-05-05 MED FILL — ZOLPIDEM TARTRATE 10 MG TAB: 10 | 15 days supply | Qty: 15 | Fill #2

## 2017-05-05 MED FILL — GABAPENTIN 600 MG TABS: 600 | 30 days supply | Qty: 60 | Fill #1

## 2017-05-16 MED FILL — DIVALPROEX SOD 500 MG TAB D: 500 | 30 days supply | Qty: 90 | Fill #0

## 2017-05-23 DIAGNOSIS — N3000 Acute cystitis without hematuria: Secondary | ICD-10-CM | POA: Diagnosis not present

## 2017-05-23 DIAGNOSIS — N952 Postmenopausal atrophic vaginitis: Secondary | ICD-10-CM | POA: Diagnosis not present

## 2017-05-23 MED FILL — ESTRADIOL 0.1 MG/GM CREA: 0.1 | 30 days supply | Qty: 43 | Fill #0

## 2017-06-01 ENCOUNTER — Telehealth: Payer: Self-pay | Admitting: Neurology

## 2017-06-01 ENCOUNTER — Encounter: Payer: Self-pay | Admitting: Neurology

## 2017-06-01 ENCOUNTER — Ambulatory Visit (INDEPENDENT_AMBULATORY_CARE_PROVIDER_SITE_OTHER): Payer: Medicare Other | Admitting: Neurology

## 2017-06-01 ENCOUNTER — Encounter (INDEPENDENT_AMBULATORY_CARE_PROVIDER_SITE_OTHER): Payer: Self-pay

## 2017-06-01 ENCOUNTER — Other Ambulatory Visit: Payer: Self-pay | Admitting: *Deleted

## 2017-06-01 VITALS — BP 122/73 | HR 86 | Ht 67.0 in | Wt 203.0 lb

## 2017-06-01 DIAGNOSIS — G909 Disorder of the autonomic nervous system, unspecified: Secondary | ICD-10-CM | POA: Diagnosis not present

## 2017-06-01 DIAGNOSIS — E559 Vitamin D deficiency, unspecified: Secondary | ICD-10-CM | POA: Diagnosis not present

## 2017-06-01 DIAGNOSIS — E538 Deficiency of other specified B group vitamins: Secondary | ICD-10-CM | POA: Diagnosis not present

## 2017-06-01 DIAGNOSIS — H814 Vertigo of central origin: Secondary | ICD-10-CM

## 2017-06-01 DIAGNOSIS — G9089 Other disorders of autonomic nervous system: Secondary | ICD-10-CM | POA: Insufficient documentation

## 2017-06-01 DIAGNOSIS — H8149 Vertigo of central origin, unspecified ear: Secondary | ICD-10-CM | POA: Diagnosis not present

## 2017-06-01 DIAGNOSIS — R799 Abnormal finding of blood chemistry, unspecified: Secondary | ICD-10-CM | POA: Diagnosis not present

## 2017-06-01 MED ORDER — MIDODRINE HCL 5 MG PO TABS: 5.0000 mg | ORAL_TABLET | Freq: Three times a day (TID) | ORAL | 6 refills | Status: DC

## 2017-06-01 MED ORDER — RIZATRIPTAN BENZOATE 5 MG PO TBDP: 5.0000 mg | ORAL_TABLET | ORAL | 6 refills | Status: DC | PRN

## 2017-06-01 MED ORDER — PYRIDOSTIGMINE BROMIDE 60 MG PO TABS: 60.0000 mg | ORAL_TABLET | Freq: Three times a day (TID) | ORAL | 11 refills | Status: DC

## 2017-06-01 MED FILL — RIZATRIPTAN 5 MG ODT: 5 | 30 days supply | Qty: 15 | Fill #0

## 2017-06-01 MED FILL — MIDODRINE HCL 10 MG TABLET: 10 | 30 days supply | Qty: 45 | Fill #0

## 2017-06-01 MED FILL — PYRIDOSTIGMINE BR 60 MG TAB: 60 | 30 days supply | Qty: 90 | Fill #0

## 2017-06-01 NOTE — Progress Notes (Signed)
PATIENT: Mallory RoyaltyLinda Blakney DOB: Jan 24, 1941  Chief Complaint  Patient presents with  . Extremity Weakness    She has been having episodes of extremity weakness - no dizziness.  Her weakness forces her to take frequent breaks from any physical activity.  . Migraine    She wakes up with headaches, on average, 2-3 mornings per week.  She will occasionally have headaches at night.  She takes Depakote DR 500mg , one tablet daily.  She uses Excedrin or Imitrex for her pain, depending on the level of severity.  . Neck Pain    Feels gabapentin keeps her neck pain tolerable.  Marland Kitchen. PCP    Clide DalesWright, Morgan Dionne, PA     HISTORICAL  Mallory RoyaltyLinda Djordjevic is a 77 year old female, seen in refer by primary care PA Clide DalesWright, Morgan Dionne, for evaluation of migraine headaches, chronic neck pain, initial evaluation was on June 01, 2017.  Reviewed and summarized the referring note, she has past medical history of hypothyroidism, on supplement, hyperlipidemia, chronic neck pain, chronic renal insufficiency,  Since 2016, she noticed progressive worsening dizziness, to the point now, she sits down most of the time, is not able to exercise, in addition, she is under a lot of stress, tried to see her husband at the nursing home on a daily basis.  She drinks 2 of 12 ounce of water bottle every day, but she complains lightheadedness shortly after standing up, mild improvement walking short distance, but with prolonged walking and standing, she would develop this washout sensation, lightheadedness, as if she is going to faint, she has to sit down to improve her symptoms.  During today's examination, she was found to have significant orthostatic blood pressure change in the setting of already low blood pressure, lying down blood pressure 97/70, heart rate of 83, standing up systolic blood pressure was 50 or less, required multiple measurement to register, she does become symptomatic, especially after standing up for more than 3  minutes.  Her symptoms gradually improved after lying flat.  Previously she was given prescription of midodrine 2.5 mg to get in the early morning, and before bed, there was no significant improvement noted.  She also noted gradual onset memory loss, difficulty focusing, she has history of migraine headache, increased migraine over the past few months, she could not tolerate Imitrex 100 mg as needed due to heart palpitation, she usually take half of 100 mg tablets, if needed, she may repeat in couple hours, which take away the headache in few hours,  She is quite disabled by her dizziness, has to stay seated most of the time, she denies bilateral lower extremity swelling, no significant sensory loss.  REVIEW OF SYSTEMS: Full 14 system review of systems performed and notable only for fatigue, hearing loss, easy bruising, urination problem, cramps, runny nose, memory loss, headaches, weakness, passing out, tremor, insomnia  ALLERGIES: No Known Allergies  HOME MEDICATIONS: Current Outpatient Medications  Medication Sig Dispense Refill  . albuterol (PROVENTIL HFA;VENTOLIN HFA) 108 (90 Base) MCG/ACT inhaler Inhale 2 puffs into the lungs every 6 (six) hours as needed for wheezing or shortness of breath. 1 Inhaler 0  . buPROPion (WELLBUTRIN XL) 150 MG 24 hr tablet Take 1 tablet (150 mg total) by mouth daily. 30 tablet 0  . divalproex (DEPAKOTE) 500 MG DR tablet Take 1 tablet (500 mg total) by mouth daily. 30 tablet 3  . fluticasone (FLONASE) 50 MCG/ACT nasal spray Place 2 sprays into both nostrils daily. 16 g 1  . fluticasone (  FLOVENT HFA) 110 MCG/ACT inhaler Inhale 2 puffs into the lungs 2 (two) times daily. 1 Inhaler 1  . gabapentin (NEURONTIN) 300 MG capsule Take 2 capsules (600 mg total) by mouth 2 (two) times daily. 120 capsule 0  . midodrine (PROAMATINE) 2.5 MG tablet Take 1 tablet (2.5 mg total) by mouth 2 (two) times daily with a meal. 60 tablet 0  . Multiple Vitamins-Minerals (PRESERVISION  AREDS 2) CAPS Take 2 capsules by mouth.    . phenazopyridine (PYRIDIUM) 100 MG tablet Take 1 tablet (100 mg total) by mouth 3 (three) times daily as needed for pain. 10 tablet 0  . sertraline (ZOLOFT) 100 MG tablet Take 1.5 tablets (150 mg total) by mouth daily. (Patient taking differently: Take 100 mg by mouth daily. )  0  . simvastatin (ZOCOR) 20 MG tablet Take 1 tablet (20 mg total) by mouth every evening. 90 tablet 1  . SUMAtriptan (IMITREX) 100 MG tablet TAKE 1 TABLET BY MOUTH MAY REPEAT IN 2 HOURS ONE TIME IF NEEDED 10 tablet 5  . SYNTHROID 112 MCG tablet TAKE ONE (1) TABLET EACH DAY BEFORE BREAKFAST 60 tablet 1  . zolpidem (AMBIEN) 10 MG tablet Take 0.5-1 tablets (5-10 mg total) by mouth at bedtime as needed. 15 tablet 3   No current facility-administered medications for this visit.     PAST MEDICAL HISTORY: Past Medical History:  Diagnosis Date  . Anxiety   . Confusion, hx of, without neuro findings   . Depression   . Hx of vertigo   . Hyperlipidemia   . Hypothyroid   . Left shoulder pain   . Migraines   . Neck pain   . Orthostatic hypotension   . Renal insufficiency     PAST SURGICAL HISTORY: Past Surgical History:  Procedure Laterality Date  . ABDOMINAL HYSTERECTOMY    . ABDOMINAL HYSTERECTOMY    . BREAST LUMPECTOMY    . CHOLECYSTECTOMY    . JOINT REPLACEMENT    . THYROID SURGERY    . TONSILLECTOMY      FAMILY HISTORY: Family History  Problem Relation Age of Onset  . Heart disease Mother        Angina  . Cancer Mother   . Cancer Father   . Heart disease Father   . Cancer Maternal Aunt     SOCIAL HISTORY:  Social History   Socioeconomic History  . Marital status: Married    Spouse name: Not on file  . Number of children: 2  . Years of education: 16  . Highest education level: Bachelor's degree (e.g., BA, AB, BS)  Social Needs  . Financial resource strain: Not on file  . Food insecurity - worry: Not on file  . Food insecurity - inability: Not on  file  . Transportation needs - medical: Not on file  . Transportation needs - non-medical: Not on file  Occupational History    Employer: RETIRED  Tobacco Use  . Smoking status: Never Smoker  . Smokeless tobacco: Never Used  Substance and Sexual Activity  . Alcohol use: No  . Drug use: No  . Sexual activity: Not on file  Other Topics Concern  . Not on file  Social History Narrative   Lives at home alone.   Right-handed.   2-3 cups caffeine per day.     PHYSICAL EXAM   Vitals:   06/01/17 1127  BP: 122/73  Pulse: 86  Weight: 203 lb (92.1 kg)  Height: 5\' 7"  (1.702 m)  Not recorded    Blood pressure lying down 97/70, heart rate of 83, standing up systolic blood pressure was 50, she becomes very symptomatic after 3 minutes standing.  Body mass index is 31.79 kg/m.  PHYSICAL EXAMNIATION:  Gen: NAD, conversant, well nourised, obese, well groomed                     Cardiovascular: Regular rate rhythm, no peripheral edema, warm, nontender. Eyes: Conjunctivae clear without exudates or hemorrhage Neck: Supple, no carotid bruits. Pulmonary: Clear to auscultation bilaterally   NEUROLOGICAL EXAM:  MENTAL STATUS: Speech:    Speech is normal; fluent and spontaneous with normal comprehension.  Cognition:     Orientation to time, place and person     Normal recent and remote memory     Normal Attention span and concentration     Normal Language, naming, repeating,spontaneous speech     Fund of knowledge   CRANIAL NERVES: CN II: Visual fields are full to confrontation. Fundoscopic exam is normal with sharp discs and no vascular changes. Pupils are round equal and briskly reactive to light. CN III, IV, VI: extraocular movement are normal. No ptosis. CN V: Facial sensation is intact to pinprick in all 3 divisions bilaterally. Corneal responses are intact.  CN VII: Face is symmetric with normal eye closure and smile. CN VIII: Hearing is normal to rubbing fingers CN IX, X:  Palate elevates symmetrically. Phonation is normal. CN XI: Head turning and shoulder shrug are intact CN XII: Tongue is midline with normal movements and no atrophy.  MOTOR: There is no pronator drift of out-stretched arms. Muscle bulk and tone are normal. Muscle strength is normal.  REFLEXES: Reflexes are 2+ and symmetric at the biceps, triceps, knees, and ankles. Plantar responses are flexor.  SENSORY: Intact to light touch, pinprick, positional sensation and vibratory sensation are intact in fingers and toes.  COORDINATION: Rapid alternating movements and fine finger movements are intact. There is no dysmetria on finger-to-nose and heel-knee-shin.    GAIT/STANCE: Posture is normal. Gait is steady with normal steps, base, arm swing, and turning. Heel and toe walking are normal. Tandem gait is normal.  Romberg is absent.   DIAGNOSTIC DATA (LABS, IMAGING, TESTING) - I reviewed patient records, labs, notes, testing and imaging myself where available.   ASSESSMENT AND PLAN  Olinda Nola is a 77 y.o. female   Autonomic failure Memory loss  Significant orthostatic blood pressure change, without significant heart rate variations.  Differentiation diagnosis including central nervous system degenerative disorder, deconditioning, small fiber neuropathy  MRI of the brain without contrast,  Laboratory evaluations for etiology  Mestinon 60 mg 3 times a day, midodrine 5 mg 3 times a day,  ECHO  Tight stocking   May consider EMG nerve conduction study     Levert Feinstein, M.D. Ph.D.  [facility] [address], [address] [place], [place] [address] Ph: [phone] Fax: [phone]  CC: Clide Dales, [place]

## 2017-06-01 NOTE — Telephone Encounter (Signed)
Patient was just seen by Dr. Terrace ArabiaYan. She says 3 new Rx's were sent to [facility]. She now uses [facility] in [place]. Please resend Rx's to [facility].

## 2017-06-01 NOTE — Telephone Encounter (Signed)
Pharmacy updated in chart and prescriptions resent.

## 2017-06-02 ENCOUNTER — Other Ambulatory Visit: Payer: Self-pay | Admitting: *Deleted

## 2017-06-02 ENCOUNTER — Other Ambulatory Visit: Payer: Self-pay | Admitting: Internal Medicine

## 2017-06-02 ENCOUNTER — Telehealth: Payer: Self-pay | Admitting: Neurology

## 2017-06-02 MED ORDER — VITAMIN D (ERGOCALCIFEROL) 1.25 MG (50000 UNIT) PO CAPS: 50000.0000 [IU] | ORAL_CAPSULE | ORAL | 0 refills | Status: DC

## 2017-06-02 MED ORDER — MIDODRINE HCL 10 MG PO TABS: 10.0000 mg | ORAL_TABLET | Freq: Three times a day (TID) | ORAL | 11 refills | Status: DC

## 2017-06-02 MED FILL — ZOLPIDEM TARTRATE 10 MG TAB: 10 | 15 days supply | Qty: 15 | Fill #3

## 2017-06-02 MED FILL — VIT D2 1.25 MG (50,000 UNIT: 1.25 MG | 28 days supply | Qty: 4 | Fill #0

## 2017-06-02 MED FILL — SYNTHROID 112 MCG TABLET: 112 | 60 days supply | Qty: 60 | Fill #0

## 2017-06-02 MED FILL — GABAPENTIN 600 MG TABS: 600 | 30 days supply | Qty: 60 | Fill #2

## 2017-06-02 NOTE — Telephone Encounter (Signed)
Please call patient, laboratory evaluation showed severe vitamin D deficiency, less than 5  I have called in vitamin D supplement, 50,000 units  once every week  After she finished 30 tablets of vitamin D prescription, she may continue to supplement with over-the-counter vitamin D3 1000 units, 2 tablets every day, will repeat laboratory evaluation

## 2017-06-02 NOTE — Telephone Encounter (Signed)
Dr. Terrace ArabiaYan has reviewed her chart - per vo by Dr. Terrace ArabiaYan, she can take midodrine 10mg , one tablet TID.  Patient aware and agreeable to change.  New rx sent to pharmacy.

## 2017-06-02 NOTE — Telephone Encounter (Signed)
Pt calling said she has taken midodrine (PROAMATINE) 5 MG tablet tid in the past and it did not help, she thought the RX was a stronger dose. (pharmacy has given her 10mg  tabs, directions read to break in half and take tid)Please call to advise

## 2017-06-02 NOTE — Telephone Encounter (Signed)
Spoke to patient - she is aware of results and will start her vitamin D3 50,000 prescription.

## 2017-06-03 LAB — HGB A1C W/O EAG: Hgb A1c MFr Bld: 5.2 % (ref 4.8–5.6)

## 2017-06-03 LAB — RPR: RPR: NONREACTIVE

## 2017-06-03 LAB — C-REACTIVE PROTEIN: CRP: 1.5 mg/L (ref 0.0–4.9)

## 2017-06-03 LAB — ANA W/REFLEX: ANA: NEGATIVE

## 2017-06-03 LAB — VITAMIN B12: Vitamin B-12: 322 pg/mL (ref 232–1245)

## 2017-06-03 LAB — COPPER, SERUM: Copper: 82 ug/dL (ref 72–166)

## 2017-06-03 LAB — SEDIMENTATION RATE: Sed Rate: 2 mm/hr (ref 0–40)

## 2017-06-03 LAB — VITAMIN D 25 HYDROXY (VIT D DEFICIENCY, FRACTURES): Vit D, 25-Hydroxy: 5.2 ng/mL — ABNORMAL LOW (ref 30.0–100.0)

## 2017-06-13 MED FILL — buPROPion HCL ER (XL) 150 M: 150 | 30 days supply | Qty: 30 | Fill #1

## 2017-06-13 MED FILL — MIDODRINE HCL 10 MG TABLET: 10 | 30 days supply | Qty: 90 | Fill #0

## 2017-06-14 ENCOUNTER — Inpatient Hospital Stay: Admission: RE | Admit: 2017-06-14 | Payer: Medicare Other | Source: Ambulatory Visit

## 2017-06-15 DIAGNOSIS — H353131 Nonexudative age-related macular degeneration, bilateral, early dry stage: Secondary | ICD-10-CM | POA: Diagnosis not present

## 2017-06-15 DIAGNOSIS — H01001 Unspecified blepharitis right upper eyelid: Secondary | ICD-10-CM | POA: Diagnosis not present

## 2017-06-15 DIAGNOSIS — H5213 Myopia, bilateral: Secondary | ICD-10-CM | POA: Diagnosis not present

## 2017-06-15 DIAGNOSIS — H524 Presbyopia: Secondary | ICD-10-CM | POA: Diagnosis not present

## 2017-06-15 DIAGNOSIS — H04123 Dry eye syndrome of bilateral lacrimal glands: Secondary | ICD-10-CM | POA: Diagnosis not present

## 2017-06-15 DIAGNOSIS — H01004 Unspecified blepharitis left upper eyelid: Secondary | ICD-10-CM | POA: Diagnosis not present

## 2017-06-20 ENCOUNTER — Inpatient Hospital Stay: Admission: RE | Admit: 2017-06-20 | Payer: Medicare Other | Source: Ambulatory Visit

## 2017-06-20 ENCOUNTER — Inpatient Hospital Stay (HOSPITAL_COMMUNITY): Admission: RE | Admit: 2017-06-20 | Payer: Medicare Other | Source: Ambulatory Visit

## 2017-06-20 DIAGNOSIS — J441 Chronic obstructive pulmonary disease with (acute) exacerbation: Secondary | ICD-10-CM | POA: Diagnosis not present

## 2017-06-20 MED FILL — predniSONE 20 MG TABS: 20 | 7 days supply | Qty: 14 | Fill #0

## 2017-06-20 MED FILL — AMOX-CLAV 875-125 MG TABLET: 875-125 | 10 days supply | Qty: 20 | Fill #0

## 2017-06-21 ENCOUNTER — Inpatient Hospital Stay (HOSPITAL_COMMUNITY): Admission: RE | Admit: 2017-06-21 | Payer: Medicare Other | Source: Ambulatory Visit

## 2017-06-27 ENCOUNTER — Other Ambulatory Visit: Payer: Medicare Other

## 2017-06-27 DIAGNOSIS — R0602 Shortness of breath: Secondary | ICD-10-CM | POA: Diagnosis not present

## 2017-06-27 DIAGNOSIS — R05 Cough: Secondary | ICD-10-CM | POA: Diagnosis not present

## 2017-06-27 DIAGNOSIS — J441 Chronic obstructive pulmonary disease with (acute) exacerbation: Secondary | ICD-10-CM | POA: Diagnosis not present

## 2017-06-28 ENCOUNTER — Inpatient Hospital Stay (HOSPITAL_COMMUNITY): Admission: RE | Admit: 2017-06-28 | Payer: Medicare Other | Source: Ambulatory Visit

## 2017-06-29 ENCOUNTER — Other Ambulatory Visit: Payer: Self-pay | Admitting: Medical

## 2017-06-29 MED FILL — SIMVASTATIN 20 MG TABLET: 20 | 90 days supply | Qty: 90 | Fill #0

## 2017-06-29 MED FILL — VIT D2 1.25 MG (50,000 UNIT: 1.25 MG | 28 days supply | Qty: 4 | Fill #1

## 2017-06-29 MED FILL — PYRIDOSTIGMINE BR 60 MG TAB: 60 | 30 days supply | Qty: 90 | Fill #1

## 2017-07-01 ENCOUNTER — Ambulatory Visit (HOSPITAL_COMMUNITY)
Admission: RE | Admit: 2017-07-01 | Discharge: 2017-07-01 | Disposition: A | Discharge status: Home / Self Care | Payer: Medicare Other | Source: Ambulatory Visit | Attending: Neurology | Admitting: Neurology

## 2017-07-01 ENCOUNTER — Encounter (INDEPENDENT_AMBULATORY_CARE_PROVIDER_SITE_OTHER): Payer: Self-pay

## 2017-07-01 DIAGNOSIS — H8149 Vertigo of central origin, unspecified ear: Secondary | ICD-10-CM | POA: Diagnosis not present

## 2017-07-01 DIAGNOSIS — G909 Disorder of the autonomic nervous system, unspecified: Secondary | ICD-10-CM | POA: Insufficient documentation

## 2017-07-01 DIAGNOSIS — E785 Hyperlipidemia, unspecified: Secondary | ICD-10-CM | POA: Diagnosis not present

## 2017-07-01 DIAGNOSIS — I351 Nonrheumatic aortic (valve) insufficiency: Secondary | ICD-10-CM | POA: Diagnosis not present

## 2017-07-01 NOTE — Progress Notes (Signed)
  Echocardiogram 2D Echocardiogram has been performed.  Darwin Rothlisberger T Rayshawn Maney 07/01/2017, 11:43 AM

## 2017-07-02 ENCOUNTER — Ambulatory Visit
Admission: RE | Admit: 2017-07-02 | Discharge: 2017-07-02 | Disposition: A | Discharge status: Home / Self Care | Payer: Medicare Other | Source: Ambulatory Visit | Attending: Neurology | Admitting: Neurology

## 2017-07-02 DIAGNOSIS — H814 Vertigo of central origin: Secondary | ICD-10-CM

## 2017-07-02 DIAGNOSIS — H8149 Vertigo of central origin, unspecified ear: Secondary | ICD-10-CM | POA: Diagnosis not present

## 2017-07-04 ENCOUNTER — Telehealth: Payer: Self-pay | Admitting: *Deleted

## 2017-07-04 NOTE — Telephone Encounter (Signed)
Spoke to patient  - she is aware of results and will keep her pending appt to further discuss.

## 2017-07-04 NOTE — Telephone Encounter (Signed)
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

## 2017-07-04 NOTE — Telephone Encounter (Signed)
Spoke to patient  - she is aware of results and will keep her pending appt to further discuss. 

## 2017-07-04 NOTE — Telephone Encounter (Signed)
-----   Message from Levert FeinsteinYijun Yan, MD sent at 07/04/2017 10:11 AM EST ----- Please call patient: There is no significant abnormality on echocardiogram.

## 2017-07-07 DIAGNOSIS — F5101 Primary insomnia: Secondary | ICD-10-CM | POA: Diagnosis not present

## 2017-07-07 MED FILL — traZODone HCL 50 MG TABS: 50 | 30 days supply | Qty: 30 | Fill #0

## 2017-07-08 MED FILL — RIZATRIPTAN 5 MG ODT: 5 | 30 days supply | Qty: 15 | Fill #1

## 2017-07-11 MED FILL — ZOLPIDEM TARTRATE 10 MG TAB: 10 | 30 days supply | Qty: 15 | Fill #0

## 2017-07-11 MED FILL — SERTRALINE HCL 100 MG TAB: 100 | 30 days supply | Qty: 45 | Fill #2

## 2017-07-11 MED FILL — SUMATRIPTAN SUCC 100 MG TAB: 100 | 10 days supply | Qty: 10 | Fill #1

## 2017-07-18 ENCOUNTER — Ambulatory Visit (INDEPENDENT_AMBULATORY_CARE_PROVIDER_SITE_OTHER): Payer: Medicare Other | Admitting: Neurology

## 2017-07-18 ENCOUNTER — Encounter (INDEPENDENT_AMBULATORY_CARE_PROVIDER_SITE_OTHER): Payer: Self-pay

## 2017-07-18 ENCOUNTER — Encounter: Payer: Self-pay | Admitting: Neurology

## 2017-07-18 VITALS — BP 156/87 | HR 76 | Ht 67.0 in | Wt 202.0 lb

## 2017-07-18 DIAGNOSIS — R42 Dizziness and giddiness: Secondary | ICD-10-CM

## 2017-07-18 DIAGNOSIS — R413 Other amnesia: Secondary | ICD-10-CM | POA: Diagnosis not present

## 2017-07-18 DIAGNOSIS — M79609 Pain in unspecified limb: Secondary | ICD-10-CM

## 2017-07-18 MED ORDER — SUMATRIPTAN SUCCINATE 50 MG PO TABS: ORAL_TABLET | ORAL | 11 refills | Status: DC

## 2017-07-18 MED ORDER — SUMATRIPTAN SUCCINATE 100 MG PO TABS: ORAL_TABLET | ORAL | 11 refills | Status: DC

## 2017-07-18 MED ORDER — NORTRIPTYLINE HCL 10 MG PO CAPS: 20.0000 mg | ORAL_CAPSULE | Freq: Every day | ORAL | 11 refills | Status: DC

## 2017-07-18 MED FILL — MIDODRINE HCL 10 MG TABLET: 10 | 30 days supply | Qty: 90 | Fill #1

## 2017-07-18 MED FILL — NORTRIPTYLINE HCL 10 MG CAP: 10 | 30 days supply | Qty: 60 | Fill #0

## 2017-07-18 MED FILL — GABAPENTIN 600 MG TAB: 600 | 30 days supply | Qty: 60 | Fill #3

## 2017-07-18 MED FILL — SUMATRIPTAN SUCC 50 MG TAB: 50 | 30 days supply | Qty: 12 | Fill #0

## 2017-07-18 NOTE — Patient Instructions (Signed)
You can take Mestinon and the midodrine at 10 AM, 2 PM, 6 PM,  Please do not take medication before you go to bed, to avoid the side effect,  Limit the Imitrex 100 mg tablets used to 2-3 times each week, only treat severe headaches, not the mild-to-moderate headache.

## 2017-07-18 NOTE — Progress Notes (Signed)
PATIENT: Mallory Durham DOB: 01/20/1941  Chief Complaint  Patient presents with  . Follow-up    PCP: Elijah Birk, PA  . Migraine     HISTORICAL  Mallory Durham is a 77 year old female, seen in refer by primary care PA Fortino Sic, for evaluation of migraine headaches, chronic neck pain, initial evaluation was on June 01, 2017.  Reviewed and summarized the referring note, she has past medical history of hypothyroidism, on supplement, hyperlipidemia, chronic neck pain, chronic renal insufficiency,  Since 2016, she noticed progressive worsening dizziness, to the point now, she sits down most of the time, is not able to exercise, in addition, she is under a lot of stress, tried to see her husband at the nursing home on a daily basis.  She drinks 2 of 12 ounce of water bottle every day, but she complains lightheadedness shortly after standing up, mild improvement walking short distance, but with prolonged walking and standing, she would develop this washout sensation, lightheadedness, as if she is going to faint, she has to sit down to improve her symptoms.  During today's examination, she was found to have significant orthostatic blood pressure change in the setting of already low blood pressure, lying down blood pressure 97/70, heart rate of 83, standing up systolic blood pressure was 50 or less, required multiple measurement to register, she does become symptomatic, especially after standing up for more than 3 minutes.  Her symptoms gradually improved after lying flat.  Previously she was given prescription of midodrine 2.5 mg to get in the early morning, and before bed, there was no significant improvement noted.  She also noted gradual onset memory loss, difficulty focusing, she has history of migraine headache, increased migraine over the past few months, she could not tolerate Imitrex 100 mg as needed due to heart palpitation, she usually take half of 100 mg tablets, if  needed, she may repeat in couple hours, which take away the headache in few hours,  She is quite disabled by her dizziness, has to stay seated most of the time, she denies bilateral lower extremity swelling, no significant sensory loss.  UPDATE July 18 2017: MRI of the brain in February 2019, generalized atrophy, especially at the right perisylvian fissure area, mild supratentorium small vessel disease, there is no acute abnormality.  Laboratory evaluation showed A1c of 5.2, normal negative ANA, copper, C-reactive protein, ESR, RPR, B12, CMP, lipid profile, BNP was elevated 246   Significant low vitamin D 5.2,  Echocardiogram showed ejection fraction of 55-60%, wall motion was normal, aortic valve showed no stenosis, ventricular size was normal, systolic function was normal  She is taking gabapentin 652m bid, midodrine 10 mg 3 times a day, last dose was before she goes to bed, she also complains of chronic insomnia, taking Ambien 10 mg at nighttime  She still has depression, anxiety, she has frequent headaches, once or twice each day, she has been taking Imitrex 50 mg or Maxalt as needed for headaches, which helps her headache some  REVIEW OF SYSTEMS: Full 14 system review of systems performed and notable only for activity change, light sensitivity, hearing loss, runny nose, memory loss, dizziness, headaches, weakness, neck stiffness, frequent awakening, depression  ALLERGIES: No Known Allergies  HOME MEDICATIONS: Current Outpatient Medications  Medication Sig Dispense Refill  . albuterol (PROVENTIL HFA;VENTOLIN HFA) 108 (90 Base) MCG/ACT inhaler Inhale 2 puffs into the lungs every 6 (six) hours as needed for wheezing or shortness of breath. 1 Inhaler 0  .  buPROPion (WELLBUTRIN XL) 150 MG 24 hr tablet Take 1 tablet (150 mg total) by mouth daily. 30 tablet 0  . fluticasone (FLONASE) 50 MCG/ACT nasal spray Place 2 sprays into both nostrils daily. 16 g 1  . fluticasone (FLOVENT HFA) 110  MCG/ACT inhaler Inhale 2 puffs into the lungs 2 (two) times daily. 1 Inhaler 1  . gabapentin (NEURONTIN) 300 MG capsule Take 2 capsules (600 mg total) by mouth 2 (two) times daily. 120 capsule 0  . midodrine (PROAMATINE) 10 MG tablet Take 1 tablet (10 mg total) by mouth 3 (three) times daily. 90 tablet 11  . Multiple Vitamins-Minerals (PRESERVISION AREDS 2) CAPS Take 2 capsules by mouth.    . phenazopyridine (PYRIDIUM) 100 MG tablet Take 1 tablet (100 mg total) by mouth 3 (three) times daily as needed for pain. 10 tablet 0  . pyridostigmine (MESTINON) 60 MG tablet Take 1 tablet (60 mg total) by mouth 3 (three) times daily. 90 tablet 11  . rizatriptan (MAXALT-MLT) 5 MG disintegrating tablet Take 1 tablet (5 mg total) by mouth as needed. May repeat in 2 hours if needed 15 tablet 6  . simvastatin (ZOCOR) 20 MG tablet Take 1 tablet (20 mg total) by mouth every evening. 90 tablet 1  . SUMAtriptan (IMITREX) 100 MG tablet TAKE 1 TABLET BY MOUTH MAY REPEAT IN 2 HOURS ONE TIME IF NEEDED 10 tablet 5  . SYNTHROID 112 MCG tablet TAKE 1 TABLET BY MOUTH DAILY BEFORE BREAKFAST 60 tablet 0  . Vitamin D, Ergocalciferol, (DRISDOL) 50000 units CAPS capsule Take 1 capsule (50,000 Units total) by mouth every 7 (seven) days. 30 capsule 0  . zolpidem (AMBIEN) 10 MG tablet Take 0.5-1 tablets (5-10 mg total) by mouth at bedtime as needed. 15 tablet 3   No current facility-administered medications for this visit.     PAST MEDICAL HISTORY: Past Medical History:  Diagnosis Date  . Anxiety   . Confusion, hx of, without neuro findings   . Depression   . Hx of vertigo   . Hyperlipidemia   . Hypothyroid   . Left shoulder pain   . Migraines   . Neck pain   . Orthostatic hypotension   . Renal insufficiency     PAST SURGICAL HISTORY: Past Surgical History:  Procedure Laterality Date  . ABDOMINAL HYSTERECTOMY    . ABDOMINAL HYSTERECTOMY    . BREAST LUMPECTOMY    . CHOLECYSTECTOMY    . JOINT REPLACEMENT    .  THYROID SURGERY    . TONSILLECTOMY      FAMILY HISTORY: Family History  Problem Relation Age of Onset  . Heart disease Mother        Angina  . Cancer Mother   . Cancer Father   . Heart disease Father   . Cancer Maternal Aunt     SOCIAL HISTORY:  Social History   Socioeconomic History  . Marital status: Married    Spouse name: Not on file  . Number of children: 2  . Years of education: 16  . Highest education level: Bachelor's degree (e.g., BA, AB, BS)  Social Needs  . Financial resource strain: Not on file  . Food insecurity - worry: Not on file  . Food insecurity - inability: Not on file  . Transportation needs - medical: Not on file  . Transportation needs - non-medical: Not on file  Occupational History    Employer: RETIRED  Tobacco Use  . Smoking status: Never Smoker  . Smokeless tobacco: Never  Used  Substance and Sexual Activity  . Alcohol use: No  . Drug use: No  . Sexual activity: Not on file  Other Topics Concern  . Not on file  Social History Narrative   Lives at home alone.   Right-handed.   2-3 cups caffeine per day.     PHYSICAL EXAM Blood pressure lying down 148/74, heart rate of 78, standing up was 120/80  There is no height or weight on file to calculate BMI.  PHYSICAL EXAMNIATION:  Gen: NAD, conversant, well nourised, obese, well groomed                     Cardiovascular: Regular rate rhythm, no peripheral edema, warm, nontender. Eyes: Conjunctivae clear without exudates or hemorrhage Neck: Supple, no carotid bruits. Pulmonary: Clear to auscultation bilaterally   NEUROLOGICAL EXAM:  MENTAL STATUS: Speech:    Speech is normal; fluent and spontaneous with normal comprehension.  Cognition:     Orientation to time, place and person     Normal recent and remote memory     Normal Attention span and concentration     Normal Language, naming, repeating,spontaneous speech     Fund of knowledge   CRANIAL NERVES: CN II: Visual fields  are full to confrontation. Fundoscopic exam is normal with sharp discs and no vascular changes. Pupils are round equal and briskly reactive to light. CN III, IV, VI: extraocular movement are normal. No ptosis. CN V: Facial sensation is intact to pinprick in all 3 divisions bilaterally. Corneal responses are intact.  CN VII: Face is symmetric with normal eye closure and smile. CN VIII: Hearing is normal to rubbing fingers CN IX, X: Palate elevates symmetrically. Phonation is normal. CN XI: Head turning and shoulder shrug are intact CN XII: Tongue is midline with normal movements and no atrophy.  MOTOR: There is no pronator drift of out-stretched arms. Muscle bulk and tone are normal. Muscle strength is normal.  REFLEXES: Reflexes are 2+ and symmetric at the biceps, triceps, knees, and ankles. Plantar responses are flexor.  SENSORY: Mildly less dependent sensory changes to pinprick.  COORDINATION: Rapid alternating movements and fine finger movements are intact. There is no dysmetria on finger-to-nose and heel-knee-shin.    GAIT/STANCE: She can get up without pushing on chair arm, steady, Romberg is absent.   DIAGNOSTIC DATA (LABS, IMAGING, TESTING) - I reviewed patient records, labs, notes, testing and imaging myself where available.   ASSESSMENT AND PLAN  Melita Villalona is a 77 y.o. female   Autonomic failure  With significant orthostatic blood pressure changes, Memory loss  Significant orthostatic blood pressure change, without significant heart rate variations.  Differentiation diagnosis including central nervous system degenerative disorder, versus deconditioning, peripheral neuropathy  Keep Mestinon 60 mg 3 times a day, midodrine 10 mg 3 times a day at 10 AM, 2 PM, 6 PM, increase water intake  Tight stocking, increase water intake   EMG nerve conduction study     Marcial Pacas, M.D. Ph.D.  [facility] [address], [place], [place]  [address] Ph: [phone] Fax: [phone]  CC: Fortino Sic, [place]

## 2017-07-19 ENCOUNTER — Encounter: Payer: Self-pay | Admitting: Neurology

## 2017-07-19 DIAGNOSIS — R413 Other amnesia: Secondary | ICD-10-CM | POA: Insufficient documentation

## 2017-08-04 ENCOUNTER — Other Ambulatory Visit: Payer: Self-pay | Admitting: Internal Medicine

## 2017-08-04 MED FILL — PYRIDOSTIGMINE BR 60 MG TAB: 60 | 30 days supply | Qty: 90 | Fill #2

## 2017-08-04 MED FILL — ZOLPIDEM TARTRATE 10 MG TAB: 10 | 30 days supply | Qty: 15 | Fill #1

## 2017-08-04 MED FILL — buPROPion HCL ER (XL) 150 M: 150 | 30 days supply | Qty: 30 | Fill #2

## 2017-08-04 MED FILL — VIT D2 1.25 MG (50,000 UNIT: 1.25 MG | 28 days supply | Qty: 4 | Fill #2

## 2017-08-05 ENCOUNTER — Telehealth: Payer: Self-pay | Admitting: Neurology

## 2017-08-05 ENCOUNTER — Ambulatory Visit (INDEPENDENT_AMBULATORY_CARE_PROVIDER_SITE_OTHER): Payer: Medicare Other | Admitting: Neurology

## 2017-08-05 DIAGNOSIS — G629 Polyneuropathy, unspecified: Secondary | ICD-10-CM

## 2017-08-05 DIAGNOSIS — M79609 Pain in unspecified limb: Secondary | ICD-10-CM

## 2017-08-05 DIAGNOSIS — R42 Dizziness and giddiness: Secondary | ICD-10-CM

## 2017-08-05 DIAGNOSIS — Z0289 Encounter for other administrative examinations: Secondary | ICD-10-CM

## 2017-08-05 MED FILL — SYNTHROID 112 MCG TABLET: 112 | 60 days supply | Qty: 60 | Fill #0

## 2017-08-05 NOTE — Telephone Encounter (Signed)
Patient has a pending appt in June.  Ok per Dr. Terrace ArabiaYan to keep this pending appt.

## 2017-08-05 NOTE — Procedures (Signed)
Full Name: Pieper Kasik Gender: Female MRN #: [id number] Date of Birth: 2041-04-15    Visit Date: 08/05/17 11:41 Age: 77 Years 7 Months Old Examining Physician: Levert Feinstein, MD  Referring Physician: Terrace Arabia, MD History: 77 year old female with bilateral feet paresthesia, orthostatic blood pressure changes.  Summary of the tests:  Nerve conduction study: Right superficial peroneal sensory response was absent, right sural sensory response showed moderately decreased to snap amplitude.  Right tibial, superficial peroneal to EDB motor responses were normal.  Right medial, ulnar radial sensory responses were normal.  Right median, ulnar motor responses were normal.  Electromyography: Selective needle examinations of right lower extremity muscles and right lumbosacral paraspinals were performed.  There is evidence of increased insertional activity, 1+ spontaneous activity of right abductor pollicis, is mildly enlarged motor unit potential was mildly decreased recruitment patterns.  Conclusion: This is an abnormal study.  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    ------------------------------- Levert Feinstein, M.D.  [facility] [address] [place], [place] [address] Tel: [phone] Fax: [phone]        [facility]    Nerve / Sites Muscle Latency Ref. Amplitude Ref. Rel Amp Segments Distance Velocity Ref. Area    ms ms mV mV %  cm m/s m/s mVms  R Median - APB     Wrist APB 3.8 ?4.4 2.8 ?4.0 100 Wrist - APB 7   10.5     Upper arm APB 7.8  2.6  93 Upper arm - Wrist 22 55 ?49 10.4  R Ulnar - ADM     Wrist ADM 2.7 ?3.3 7.2 ?6.0 100 Wrist - ADM 7   26.4     B.Elbow ADM 6.2  6.1  85 B.Elbow - Wrist 18 52 ?49 23.1     A.Elbow ADM 8.2  5.8  95.1 A.Elbow - B.Elbow 10 49 ?49 22.5         A.Elbow - Wrist      R Peroneal - EDB     Ankle EDB 4.3 ?6.5 4.9 ?2.0 100 Ankle - EDB 9   19.5     Fib head EDB 11.7  3.8   78.8 Fib head - Ankle 33 44 ?44 18.1     Pop fossa EDB 14.0  3.7  97.2 Pop fossa - Fib head 10 45 ?44 17.8         Pop fossa - Ankle      R Tibial - AH     Ankle AH 4.8 ?5.8 5.7 ?4.0 100 Ankle - AH 9   16.9     Pop fossa AH 14.8  4.0  70.3 Pop fossa - Ankle 37 37 ?41 15.8             SNC    Nerve / Sites Rec. Site Peak Lat Ref.  Amp Ref. Segments Distance Peak Diff Ref.    ms ms V V  cm ms ms  R Radial - Anatomical snuff box (Forearm)     Forearm Wrist 2.9 ?2.9 16 ?15 Forearm - Wrist 10    R Sural - Ankle (Calf)     Calf Ankle 3.5 ?4.4 2 ?6 Calf - Ankle 14    R Superficial peroneal- Ankle       Calf Ankle 3.5 ?4.4 Absent  ?6 Calf - Ankle 14      R Median, Ulnar - Transcarpal comparison     Median Palm Wrist 2.2 ?2.2  29 ?35 Median Palm - Wrist 8       Ulnar Palm Wrist 2.1 ?2.2 10 ?12 Ulnar Palm - Wrist 8          Median Palm - Ulnar Palm  0.1 ?0.4  R Median - Orthodromic (Dig II, Mid palm)     Dig II Wrist 3.2 ?3.4 15 ?10 Dig II - Wrist 13    R Ulnar - Orthodromic, (Dig V, Mid palm)     Dig V Wrist 2.9 ?3.1 5 ?5 Dig V - Wrist 6011                   F  Wave    Nerve F Lat Ref.   ms ms  R Tibial - AH 56.9 ?56.0  R Ulnar - ADM 29.1 ?32.0         EMG full       EMG Summary Table    Spontaneous MUAP Recruitment  Muscle IA Fib PSW Fasc Other Amp Dur. Poly Pattern  R. Tibialis anterior Normal None None None _______ Normal Normal Normal Normal  R. Peroneus longus Normal None None None _______ Normal Normal Normal Normal  R. Gastrocnemius (Medial head) Normal None None None _______ Normal Normal Normal Normal  R. Vastus lateralis Normal None None None _______ Normal Normal Normal Normal  R. Biceps femoris (long head) Normal None None None _______ Normal Normal Normal Normal  R. Lumbar paraspinals (mid) Normal None None None _______ Normal Normal Normal Normal  R. Lumbar paraspinals (low) Normal None None None _______ Normal Normal Normal Normal  R. Abductor hallucis Increased 1+  None None _______ Normal Normal Normal Reduced  L. Abductor hallucis Increased 1+ None None _______ Normal Normal Normal Reduced

## 2017-08-05 NOTE — Telephone Encounter (Signed)
Please give her a follow up in 2 month, likely doxidopa candidate, we still have the sample?

## 2017-08-16 MED FILL — SUMATRIPTAN SUCC 50 MG TAB: 50 | 30 days supply | Qty: 12 | Fill #1

## 2017-08-16 MED FILL — NORTRIPTYLINE HCL 10 MG CAP: 10 | 30 days supply | Qty: 60 | Fill #1

## 2017-08-31 MED FILL — SERTRALINE HCL 100 MG TAB: 100 | 30 days supply | Qty: 45 | Fill #3

## 2017-08-31 MED FILL — buPROPion HCL ER (XL) 150 M: 150 | 30 days supply | Qty: 30 | Fill #3

## 2017-09-05 DIAGNOSIS — M791 Myalgia, unspecified site: Secondary | ICD-10-CM | POA: Diagnosis not present

## 2017-09-05 DIAGNOSIS — F5101 Primary insomnia: Secondary | ICD-10-CM | POA: Diagnosis not present

## 2017-09-05 DIAGNOSIS — E782 Mixed hyperlipidemia: Secondary | ICD-10-CM | POA: Diagnosis not present

## 2017-09-05 DIAGNOSIS — E559 Vitamin D deficiency, unspecified: Secondary | ICD-10-CM | POA: Diagnosis not present

## 2017-09-05 DIAGNOSIS — E890 Postprocedural hypothyroidism: Secondary | ICD-10-CM | POA: Diagnosis not present

## 2017-09-05 MED FILL — ZOLPIDEM TARTRATE 10 MG TAB: 10 | 30 days supply | Qty: 15 | Fill #0

## 2017-09-12 MED FILL — NORTRIPTYLINE HCL 10 MG CAP: 10 | 30 days supply | Qty: 60 | Fill #2

## 2017-09-12 MED FILL — SUMATRIPTAN SUCC 50 MG TAB: 50 | 30 days supply | Qty: 12 | Fill #2

## 2017-09-12 MED FILL — MIDODRINE HCL 10 MG TABLET: 10 | 30 days supply | Qty: 90 | Fill #2

## 2017-09-27 ENCOUNTER — Ambulatory Visit (INDEPENDENT_AMBULATORY_CARE_PROVIDER_SITE_OTHER): Payer: Medicare Other | Admitting: Internal Medicine

## 2017-09-27 VITALS — BP 108/70 | HR 95 | Ht 67.0 in | Wt 199.4 lb

## 2017-09-27 DIAGNOSIS — E890 Postprocedural hypothyroidism: Secondary | ICD-10-CM

## 2017-09-27 NOTE — Progress Notes (Signed)
Patient ID: Mallory Durham, female   DOB: 11-Sep-1940, 77 y.o.   MRN: [id number]   HPI  Mallory Durham is a 77 y.o.-year-old female, returning for f/u for postsurgical hypothyroidism. Last visit 1 year ago  She is still having a lot of stress with her husband who has dementia.  He is in a long term facility.  Reviewed and addended history: 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  A TSH returned slightly low in 09/2016 and we decreased the dose at that time to 112 mcg daily.  Subsequent TFTs have been normal.  Pt continues on Synthroid d.a.w. 112 mcg daily, taken: - in am - fasting - at least 30 min from b'fast - no Ca, Fe, MVI - + PPIs as needed - not on Biotin - + AREDs 2 (no Ca or iron) 2x a day, first dose in am, with b'fast  I reviewed patient's TFTs: Lab Results  Component Value Date   TSH 0.45 11/30/2016   TSH 0.26 (L) 09/27/2016   TSH 0.38 07/05/2016   TSH 0.37 03/22/2016   TSH 0.24 (L) 11/20/2015   TSH 0.03 (L) 08/26/2015   TSH 0.17 (L) 06/05/2015   FREET4 0.99 11/30/2016   FREET4 1.13 09/27/2016   FREET4 0.95 07/05/2016   FREET4 1.22 03/22/2016   FREET4 0.92 11/20/2015   FREET4 1.44 08/26/2015  03/31/2015: TSH 6.82, fT4 1.24 01/22/2015: TSH 0.420 11/28/2014: TSH 5.440  TPO ABs per [facility] records - ? Why checked since pt had thyroidectomy in 2000...:  03/31/2015: TPO Abs <6 10/03/2013: TPO Abs 7 07/02/2010: TPO Abs <6  Pt denies: - feeling nodules in neck - hoarseness - dysphagia - choking - SOB with lying down  She has + FH of thyroid disorders in mother and sister: hypothyroidism. No FH of thyroid cancer. No h/o radiation tx to head or neck.  No seaweed or kelp. No recent contrast studies. No herbal supplements. No Biotin use. No recent steroids use.   She  also has a history of hysterectomy in 1994. Also, HL, GERD, depression, ruptured Achilles tendon 2015.  On Ergocalciferol weekly.   She was dx'ed with autonomic dysfunction (low BP episodes) >> on Mestinon, midodrine.Sees neurologist.  On Nortriptyline for HA prevention.   She is on Gabapentin for neck mm relaxation , dose decreased from 3x to 2x a day.   ROS: Constitutional: no weight gain/no weight loss, + fatigue, no subjective hyperthermia, no subjective hypothermia Eyes: no blurry vision, no xerophthalmia ENT: no sore throat, + see HPI Cardiovascular: no CP/+ SOB/no palpitations/no leg swelling Respiratory: no cough/+ SOB/no wheezing Gastrointestinal: no N/no V/no D/no C/no acid reflux Musculoskeletal: no muscle aches/no joint aches Skin: no rashes, no hair loss Neurological: no tremors/no numbness/no tingling/no dizziness  I reviewed pt's medications, allergies, PMH, social hx, family hx, and changes were documented in the history of present illness. Otherwise, unchanged from my initial visit note.  Past Medical History:  Diagnosis Date  . Anxiety   . Confusion, hx of, without neuro findings   . Depression   . Hx of vertigo   . Hyperlipidemia   . Hypothyroid   . Left shoulder pain   . Migraines   . Neck pain   . Orthostatic hypotension   . Renal insufficiency    Past Surgical History:  Procedure Laterality Date  .  ABDOMINAL HYSTERECTOMY    . ABDOMINAL HYSTERECTOMY    . BREAST LUMPECTOMY    . CHOLECYSTECTOMY    . JOINT REPLACEMENT    . THYROID SURGERY    . TONSILLECTOMY     Social History   Social History  . Marital status: Married    Spouse name: N/A  . Number of children: 2   Occupational History  .  Retired   Social History Main Topics  . Smoking status: Never Smoker  . Smokeless tobacco: Never Used  . Alcohol use No  . Drug use: No   Current Outpatient Medications on File Prior to Visit  Medication Sig Dispense Refill  . albuterol (PROVENTIL  HFA;VENTOLIN HFA) 108 (90 Base) MCG/ACT inhaler Inhale 2 puffs into the lungs every 6 (six) hours as needed for wheezing or shortness of breath. 1 Inhaler 0  . buPROPion (WELLBUTRIN XL) 150 MG 24 hr tablet Take 1 tablet (150 mg total) by mouth daily. 30 tablet 0  . fluticasone (FLONASE) 50 MCG/ACT nasal spray Place 2 sprays into both nostrils daily. 16 g 1  . fluticasone (FLOVENT HFA) 110 MCG/ACT inhaler Inhale 2 puffs into the lungs 2 (two) times daily. 1 Inhaler 1  . gabapentin (NEURONTIN) 600 MG tablet Take 600 mg by mouth 3 (three) times daily.    . midodrine (PROAMATINE) 10 MG tablet Take 1 tablet (10 mg total) by mouth 3 (three) times daily. 90 tablet 11  . Multiple Vitamins-Minerals (PRESERVISION AREDS 2) CAPS Take 2 capsules by mouth.    . nortriptyline (PAMELOR) 10 MG capsule Take 2 capsules (20 mg total) by mouth at bedtime. 60 capsule 11  . phenazopyridine (PYRIDIUM) 100 MG tablet Take 1 tablet (100 mg total) by mouth 3 (three) times daily as needed for pain. 10 tablet 0  . pyridostigmine (MESTINON) 60 MG tablet Take 1 tablet (60 mg total) by mouth 3 (three) times daily. 90 tablet 11  . simvastatin (ZOCOR) 20 MG tablet Take 1 tablet (20 mg total) by mouth every evening. 90 tablet 1  . SUMAtriptan (IMITREX) 50 MG tablet TAKE 1 TABLET BY MOUTH MAY REPEAT IN 2 HOURS ONE TIME IF NEEDED 12 tablet 11  . SYNTHROID 112 MCG tablet TAKE 1 TABLET BY MOUTH DAILY BEFORE BREAKFAST 60 tablet 0  . Vitamin D, Ergocalciferol, (DRISDOL) 50000 units CAPS capsule Take 1 capsule (50,000 Units total) by mouth every 7 (seven) days. 30 capsule 0  . zolpidem (AMBIEN) 10 MG tablet Take 0.5-1 tablets (5-10 mg total) by mouth at bedtime as needed. 15 tablet 3   No current facility-administered medications on file prior to visit.    No Known Allergies Family History  Problem Relation Age of Onset  . Heart disease Mother        Angina  . Cancer Mother   . Cancer Father   . Heart disease Father   . Cancer  Maternal Aunt    PE: BP 108/70   Pulse 95   Ht  (1.702 m)   Wt 199 lb 6.4 oz (90.4 kg)   SpO2 95%   BMI 31.23 kg/m  Wt Readings from Last 3 Encounters:  09/27/17 199 lb 6.4 oz (90.4 kg)  07/18/17 202 lb (91.6 kg)  06/01/17 203 lb (92.1 kg)   Constitutional: overweight, in NAD Eyes: PERRLA, EOMI, no exophthalmos ENT: moist mucous membranes, no thyromegaly, no cervical lymphadenopathy Cardiovascular: Tachycardia, RR, No MRG Respiratory: CTA B Gastrointestinal: abdomen soft, NT, ND, BS+ Musculoskeletal: no deformities, strength intact  in all 4 Skin: moist, warm, no rashes Neurological: no tremor with outstretched hands, DTR normal in all 4  ASSESSMENT: 1.  Postsurgical Hypothyroidism  PLAN:  1. Postsurgical Hypothyroidism - latest thyroid labs reviewed with pt >> normal in 11/2016 - she continues on Synthroid 112 mcg daily - pt feels good on this dose. - we discussed about taking the thyroid hormone every day, with water, >30 minutes before breakfast, separated by >4 hours from acid reflux medications, calcium, iron, multivitamins. Pt. is taking it correctly. - will check thyroid tests today: TSH and fT4 - If labs are abnormal, she will need to return for repeat TFTs in 1.5 months - OTW, I will see her back in a year  - time spent with the patient: 15 minutes, of which >50% was spent in obtaining information about her symptoms, reviewing her previous labs, evaluations, and treatments, counseling her about her condition, and developing a plan to further investigate and treat it.  Component     Latest Ref Rng & Units 09/27/2017  TSH     0.35 - 4.50 uIU/mL 0.10 (L)  T4,Free(Direct)     0.60 - 1.60 ng/dL 1.61   TSH is again suppressed, so we will need to decrease her levothyroxine dose further, to 100 mcg daily. We will have her back for labs in 1.5 months.  Carlus Pavlov, MD PhD [facility]

## 2017-09-27 NOTE — Patient Instructions (Signed)
Please stop at the lab.  Please continue Synthroid 112 mcg daily.  Take the thyroid hormone every day, with water, at least 30 minutes before breakfast, separated by at least 4 hours from: - acid reflux medications - calcium - iron - multivitamins  Please come back for a follow-up appointment in 1 year. 

## 2017-09-28 ENCOUNTER — Encounter: Payer: Self-pay | Admitting: Internal Medicine

## 2017-09-28 ENCOUNTER — Telehealth: Payer: Self-pay

## 2017-09-28 LAB — T4, FREE: Free T4: 1.01 ng/dL (ref 0.60–1.60)

## 2017-09-28 LAB — TSH: TSH: 0.1 u[IU]/mL — ABNORMAL LOW (ref 0.35–4.50)

## 2017-09-28 MED ORDER — SYNTHROID 100 MCG PO TABS: 100.0000 ug | ORAL_TABLET | Freq: Every day | ORAL | 5 refills | Status: DC

## 2017-09-28 MED FILL — SYNTHROID 100 MCG TABLET: 100 | 45 days supply | Qty: 45 | Fill #0

## 2017-09-28 NOTE — Telephone Encounter (Signed)
I have scheduled lab for this patient and made her aware of dosage change

## 2017-09-28 NOTE — Telephone Encounter (Signed)
Can you please clarify dosage for synthroid for this patient result note states 400 mcg but 100 mcg was ordered today please advise

## 2017-09-28 NOTE — Telephone Encounter (Signed)
Mallory Durham, it was a dictation error: it should have written: "for 100 mcg" instead of "400 mcg".

## 2017-10-03 ENCOUNTER — Other Ambulatory Visit: Payer: Self-pay | Admitting: Medical

## 2017-10-03 MED FILL — VIT D2 1.25 MG (50,000 UNIT: 1.25 MG | 28 days supply | Qty: 4 | Fill #3

## 2017-10-03 MED FILL — ZOLPIDEM TARTRATE 10 MG TAB: 10 | 30 days supply | Qty: 15 | Fill #1

## 2017-10-03 MED FILL — GABAPENTIN 600 MG TABS: 600 | 30 days supply | Qty: 60 | Fill #4

## 2017-10-03 MED FILL — PYRIDOSTIGMINE BR 60 MG TAB: 60 | 30 days supply | Qty: 90 | Fill #3

## 2017-10-04 MED FILL — SIMVASTATIN 20 MG TABLET: 20 | 90 days supply | Qty: 90 | Fill #0

## 2017-10-11 MED FILL — SUMATRIPTAN SUCC 50 MG TAB: 50 | 30 days supply | Qty: 12 | Fill #3

## 2017-10-11 MED FILL — NORTRIPTYLINE HCL 10 MG CAP: 10 | 30 days supply | Qty: 60 | Fill #3

## 2017-10-11 MED FILL — buPROPion HCL ER (XL) 150 M: 150 | 30 days supply | Qty: 30 | Fill #4

## 2017-10-20 ENCOUNTER — Ambulatory Visit: Payer: Medicare Other | Admitting: Neurology

## 2017-11-01 ENCOUNTER — Encounter: Payer: Self-pay | Admitting: Neurology

## 2017-11-01 ENCOUNTER — Ambulatory Visit (INDEPENDENT_AMBULATORY_CARE_PROVIDER_SITE_OTHER): Payer: Medicare Other | Admitting: Neurology

## 2017-11-01 VITALS — BP 112/82 | HR 84 | Ht 67.0 in | Wt 200.0 lb

## 2017-11-01 DIAGNOSIS — G909 Disorder of the autonomic nervous system, unspecified: Secondary | ICD-10-CM | POA: Diagnosis not present

## 2017-11-01 DIAGNOSIS — F5104 Psychophysiologic insomnia: Secondary | ICD-10-CM

## 2017-11-01 DIAGNOSIS — G43709 Chronic migraine without aura, not intractable, without status migrainosus: Secondary | ICD-10-CM

## 2017-11-01 DIAGNOSIS — I951 Orthostatic hypotension: Secondary | ICD-10-CM

## 2017-11-01 DIAGNOSIS — IMO0002 Reserved for concepts with insufficient information to code with codable children: Secondary | ICD-10-CM | POA: Insufficient documentation

## 2017-11-01 MED ORDER — DROXIDOPA 300 MG PO CAPS: 300.0000 mg | ORAL_CAPSULE | Freq: Three times a day (TID) | ORAL | 11 refills | Status: DC

## 2017-11-01 MED ORDER — ZOLPIDEM TARTRATE 10 MG PO TABS: 5.0000 mg | ORAL_TABLET | Freq: Every evening | ORAL | 5 refills | Status: DC | PRN

## 2017-11-01 MED FILL — ZOLPIDEM TARTRATE 10 MG TAB: 10 | 30 days supply | Qty: 30 | Fill #0

## 2017-11-01 MED FILL — VIT D2 1.25 MG (50,000 UNIT: 1.25 MG | 28 days supply | Qty: 4 | Fill #4

## 2017-11-01 NOTE — Progress Notes (Addendum)
PATIENT: Mallory Durham DOB: 01-Jan-1941  Chief Complaint  Patient presents with  . Follow-up    Patient here with daughter. Patient reports that she is not doing any better.      HISTORICAL  Mallory Durham is a 77 year old female, seen in refer by primary care PA Fortino Sic, for evaluation of migraine headaches, chronic neck pain, initial evaluation was on June 01, 2017.  Reviewed and summarized the referring note, she has past medical history of hypothyroidism, on supplement, hyperlipidemia, chronic neck pain, chronic renal insufficiency,  Since 2016, she noticed progressive worsening dizziness, to the point now, she sits down most of the time, is not able to exercise, in addition, she is under a lot of stress, tried to see her husband at the nursing home on a daily basis.  She drinks 2 of 12 ounce of water bottle every day, but she complains lightheadedness shortly after standing up, mild improvement walking short distance, but with prolonged walking and standing, she would develop this washout sensation, lightheadedness, as if she is going to faint, she has to sit down to improve her symptoms.  During today's examination, she was found to have significant orthostatic blood pressure change in the setting of already low blood pressure, lying down blood pressure 97/70, heart rate of 83, standing up systolic blood pressure was 50 or less, required multiple measurement to register, she does become symptomatic, especially after standing up for more than 3 minutes.  Her symptoms gradually improved after lying flat.  Previously she was given prescription of midodrine 2.5 mg to get in the early morning, and before bed, there was no significant improvement noted.  She also noted gradual onset memory loss, difficulty focusing, she has history of migraine headache, increased migraine over the past few months, she could not tolerate Imitrex 100 mg as needed due to heart palpitation, she  usually take half of 100 mg tablets, if needed, she may repeat in couple hours, which take away the headache in few hours,  She is quite disabled by her dizziness, has to stay seated most of the time, she denies bilateral lower extremity swelling, no significant sensory loss.  UPDATE July 18 2017: MRI of the brain in February 2019, generalized atrophy, especially at the right perisylvian fissure area, mild supratentorium small vessel disease, there is no acute abnormality.  Laboratory evaluation showed A1c of 5.2, normal negative ANA, copper, C-reactive protein, ESR, RPR, B12, CMP, lipid profile, BNP was elevated 246   Significant low vitamin D 5.2,  Echocardiogram showed ejection fraction of 55-60%, wall motion was normal, aortic valve showed no stenosis, ventricular size was normal, systolic function was normal  She is taking gabapentin 636m bid, midodrine 10 mg 3 times a day, last dose was before she goes to bed, she also complains of chronic insomnia, taking Ambien 10 mg at nighttime  She still has depression, anxiety, she has frequent headaches, once or twice each day, she has been 50 mg as needed, 50 mg or Maxalt as needed for headaches, which helps her headache some  UPDATE November 01 2017: She is accompanied by her daughter [name] today's clinical visit, functional status has much declined, despite midodrine 10 mg 3 times a day, and the Mestinon 60 mg 3 times a day, she complains of lightheadedness, dizziness when getting up just a few minutes, today she had significant orthostatic pressure dropped fCBUL/845/36to systolic 60 after standing up, difficult to register diastolic pressure.   She complains lightheadedness  fainting sensation after lying down for a few minutes,blood pressure points back to 120/80, her symptoms has much improved  She also complains of frequent migraine headaches, sometimes woke up with migraine headaches, taking Imitrex as needed  Chronic insomnia, Ambien 5  to 10 mg as needed  REVIEW OF SYSTEMS: Full 14 system review of systems performed and notable only for fatigue, hearing loss, runny nose, light sensitivity, nausea, vomiting, insomnia, achy muscles, bruise easily, dizziness, headaches, weakness, passing out  ALLERGIES: No Known Allergies   HOME MEDICATIONS: Current Outpatient Medications  Medication Sig Dispense Refill  . albuterol (PROVENTIL HFA;VENTOLIN HFA) 108 (90 Base) MCG/ACT inhaler Inhale 2 puffs into the lungs every 6 (six) hours as needed for wheezing or shortness of breath. 1 Inhaler 0  . buPROPion (WELLBUTRIN XL) 150 MG 24 hr tablet Take 1 tablet (150 mg total) by mouth daily. 30 tablet 0  . fluticasone (FLONASE) 50 MCG/ACT nasal spray Place 2 sprays into both nostrils daily. 16 g 1  . fluticasone (FLOVENT HFA) 110 MCG/ACT inhaler Inhale 2 puffs into the lungs 2 (two) times daily. 1 Inhaler 1  . gabapentin (NEURONTIN) 600 MG tablet Take 600 mg by mouth 3 (three) times daily.    . midodrine (PROAMATINE) 10 MG tablet Take 1 tablet (10 mg total) by mouth 3 (three) times daily. 90 tablet 11  . Multiple Vitamins-Minerals (PRESERVISION AREDS 2) CAPS Take 2 capsules by mouth.    . nortriptyline (PAMELOR) 10 MG capsule Take 2 capsules (20 mg total) by mouth at bedtime. 60 capsule 11  . phenazopyridine (PYRIDIUM) 100 MG tablet Take 1 tablet (100 mg total) by mouth 3 (three) times daily as needed for pain. 10 tablet 0  . pyridostigmine (MESTINON) 60 MG tablet Take 1 tablet (60 mg total) by mouth 3 (three) times daily. 90 tablet 11  . simvastatin (ZOCOR) 20 MG tablet Take 1 tablet (20 mg total) by mouth every evening. 90 tablet 1  . SUMAtriptan (IMITREX) 50 MG tablet TAKE 1 TABLET BY MOUTH MAY REPEAT IN 2 HOURS ONE TIME IF NEEDED 12 tablet 11  . SYNTHROID 100 MCG tablet Take 1 tablet (100 mcg total) by mouth daily before breakfast. 45 tablet 5  . Vitamin D, Ergocalciferol, (DRISDOL) 50000 units CAPS capsule Take 1 capsule (50,000 Units total)  by mouth every 7 (seven) days. 30 capsule 0  . zolpidem (AMBIEN) 10 MG tablet Take 0.5-1 tablets (5-10 mg total) by mouth at bedtime as needed. 15 tablet 3   No current facility-administered medications for this visit.     PAST MEDICAL HISTORY: Past Medical History:  Diagnosis Date  . Anxiety   . Confusion, hx of, without neuro findings   . Depression   . Hx of vertigo   . Hyperlipidemia   . Hypothyroid   . Left shoulder pain   . Migraines   . Neck pain   . Orthostatic hypotension   . Renal insufficiency     PAST SURGICAL HISTORY: Past Surgical History:  Procedure Laterality Date  . ABDOMINAL HYSTERECTOMY    . ABDOMINAL HYSTERECTOMY    . BREAST LUMPECTOMY    . CHOLECYSTECTOMY    . JOINT REPLACEMENT    . THYROID SURGERY    . TONSILLECTOMY      FAMILY HISTORY: Family History  Problem Relation Age of Onset  . Heart disease Mother        Angina  . Cancer Mother   . Cancer Father   . Heart disease Father   .  Cancer Maternal Aunt     SOCIAL HISTORY:  Social History   Socioeconomic History  . Marital status: Married    Spouse name: Not on file  . Number of children: 2  . Years of education: 16  . Highest education level: Bachelor's degree (e.g., BA, AB, BS)  Occupational History    Employer: RETIRED  Social Needs  . Financial resource strain: Not on file  . Food insecurity:    Worry: Not on file    Inability: Not on file  . Transportation needs:    Medical: Not on file    Non-medical: Not on file  Tobacco Use  . Smoking status: Never Smoker  . Smokeless tobacco: Never Used  Substance and Sexual Activity  . Alcohol use: No  . Drug use: No  . Sexual activity: Not on file  Lifestyle  . Physical activity:    Days per week: Not on file    Minutes per session: Not on file  . Stress: Not on file  Relationships  . Social connections:    Talks on phone: Not on file    Gets together: Not on file    Attends religious service: Not on file    Active  member of club or organization: Not on file    Attends meetings of clubs or organizations: Not on file    Relationship status: Not on file  . Intimate partner violence:    Fear of current or ex partner: Not on file    Emotionally abused: Not on file    Physically abused: Not on file    Forced sexual activity: Not on file  Other Topics Concern  . Not on file  Social History Narrative   Lives at home alone.   Right-handed.   2-3 cups caffeine per day.     PHYSICAL EXAM Blood pressure lying down 148/74, heart rate of 78, standing up was 120/80  Body mass index is 31.32 kg/m.  PHYSICAL EXAMNIATION:  Gen: NAD, conversant, well nourised, obese, well groomed                     Cardiovascular: Regular rate rhythm, no peripheral edema, warm, nontender. Eyes: Conjunctivae clear without exudates or hemorrhage Neck: Supple, no carotid bruits. Pulmonary: Clear to auscultation bilaterally   NEUROLOGICAL EXAM:  MENTAL STATUS: Speech:    Speech is normal; fluent and spontaneous with normal comprehension.  Cognition:     Orientation to time, place and person     Normal recent and remote memory     Normal Attention span and concentration     Normal Language, naming, repeating,spontaneous speech     Fund of knowledge   CRANIAL NERVES: CN II: Visual fields are full to confrontation. Fundoscopic exam is normal with sharp discs and no vascular changes. Pupils are round equal and briskly reactive to light. CN III, IV, VI: extraocular movement are normal. No ptosis. CN V: Facial sensation is intact to pinprick in all 3 divisions bilaterally. Corneal responses are intact.  CN VII: Face is symmetric with normal eye closure and smile. CN VIII: Hearing is normal to rubbing fingers CN IX, X: Palate elevates symmetrically. Phonation is normal. CN XI: Head turning and shoulder shrug are intact CN XII: Tongue is midline with normal movements and no atrophy.  MOTOR: There is no pronator drift  of out-stretched arms. Muscle bulk and tone are normal. Muscle strength is normal.  REFLEXES: Reflexes are 2+ and symmetric at the biceps, triceps,  knees, and ankles. Plantar responses are flexor.  SENSORY: Mildly less dependent sensory changes to pinprick.  COORDINATION: Rapid alternating movements and fine finger movements are intact. There is no dysmetria on finger-to-nose and heel-knee-shin.    GAIT/STANCE: She can get up without pushing on chair arm, steady, Romberg is absent.   DIAGNOSTIC DATA (LABS, IMAGING, TESTING) - I reviewed patient records, labs, notes, testing and imaging myself where available.   ASSESSMENT AND PLAN  Shraddha Lebron is a 77 y.o. female   Autonomic failure  With significant orthostatic blood pressure changes,  No treatable etiology found, most consistent with central nervous system degenerative disorder  She also reported pseudobulbar phenomena  Patient is severely symptomatic from her profound autonomic failure, significant orthostatic blood pressure changes, essentially disabled from her symptoms,   Will add on Fludrocortisone 0.1 mg tid, if she remains significantly symptomatic,  We will add on Northera I gave her 100 mg tablets sample, titrating to 3 tablets 3 times a day if fludrocortisone does not provide significant improvement  Refer her to [facility] for [facility] opinion  Stop midodrine, continue Mestinon 60 mg 3 times a day  Chronic migraine headaches  Imitrex 50 mg as needed  Chronic insomnia  Ambien as needed   Marcial Pacas, M.D. Ph.D.  [facility] [address], [place], [place] [address] Ph: [phone] Fax: [phone]  CC: Fortino Sic, [place]

## 2017-11-01 NOTE — Patient Instructions (Signed)
https://www.young.com/https://www.ninds.nih.gov/Disorders/All-Disorders/Orthostatic-Hypotension-Information-Page#disorders-r2

## 2017-11-02 ENCOUNTER — Encounter: Payer: Self-pay | Admitting: Neurology

## 2017-11-02 ENCOUNTER — Telehealth: Payer: Self-pay | Admitting: Neurology

## 2017-11-02 MED ORDER — FLUDROCORTISONE ACETATE 0.1 MG PO TABS
0.1000 mg | ORAL_TABLET | Freq: Three times a day (TID) | ORAL | 6 refills | Status: DC
Start: 2017-11-02 — End: 2018-09-14

## 2017-11-02 MED FILL — FLUDROCORTISONE 0.1 MG TAB: 0.1 | 30 days supply | Qty: 90 | Fill #0

## 2017-11-02 NOTE — Telephone Encounter (Signed)
Referral has been sent to Nurse reviewer Shanice to try and get patient a sooner apt because Dr. Rubin Payorsiddiqui does not have a open slot until 2020 . Barnett ApplebaumShanice will review records and call me at [phone].

## 2017-11-02 NOTE — Addendum Note (Signed)
Addended by: Levert FeinsteinYAN, Keigan Tafoya on: 11/02/2017 08:15 AM   Modules accepted: Orders

## 2017-11-03 ENCOUNTER — Telehealth: Payer: Self-pay | Admitting: Neurology

## 2017-11-04 MED FILL — SUMATRIPTAN SUCC 100 MG TAB: 100 | 30 days supply | Qty: 12 | Fill #0

## 2017-11-09 ENCOUNTER — Telehealth: Payer: Self-pay | Admitting: Neurology

## 2017-11-09 MED FILL — SERTRALINE HCL 100 MG TAB: 100 | 30 days supply | Qty: 45 | Fill #4

## 2017-11-09 NOTE — Telephone Encounter (Addendum)
She started feeling better on 11/09/17 and would like to continue with fludrocortisone.  She is hopeful that she is turning the corner.  She will call us back next week to give us another update.

## 2017-11-09 NOTE — Telephone Encounter (Signed)
Pt has called re: her fludrocortisone (FLORINEF) 0.1 MG tablet.  Pt stated she was returning the call to [employer] Michelle.  Pt states she was told to call back after a week of being on fludrocortisone (FLORINEF) 0.1 MG tablet.  RN Marcelino DusterMichelle was not available to take pt's call but stated she would call pt back.  That message was relayed to pt, she will wait for a call back

## 2017-11-15 ENCOUNTER — Telehealth: Payer: Self-pay | Admitting: Internal Medicine

## 2017-11-15 ENCOUNTER — Telehealth: Payer: Self-pay

## 2017-11-15 ENCOUNTER — Other Ambulatory Visit (INDEPENDENT_AMBULATORY_CARE_PROVIDER_SITE_OTHER): Payer: Medicare Other

## 2017-11-15 DIAGNOSIS — E890 Postprocedural hypothyroidism: Secondary | ICD-10-CM | POA: Diagnosis not present

## 2017-11-15 LAB — T4, FREE: FREE T4: 0.94 ng/dL (ref 0.60–1.60)

## 2017-11-15 LAB — TSH: TSH: 0.83 u[IU]/mL (ref 0.35–4.50)

## 2017-11-15 NOTE — Telephone Encounter (Signed)
Lab orders

## 2017-11-15 NOTE — Telephone Encounter (Signed)
Patient wanted to let you know that she is talking medication hydrocodone 2 x a week.

## 2017-11-15 NOTE — Telephone Encounter (Signed)
Noted.  I am not sure why she is letting me know this, but I think she is concerned about possible interaction with levothyroxine.  Hydrocodone should not interfere with levothyroxine.

## 2017-11-15 NOTE — Telephone Encounter (Signed)
FYI

## 2017-11-22 MED FILL — SYNTHROID 100 MCG TABLET: 100 | 45 days supply | Qty: 45 | Fill #1

## 2017-11-22 MED FILL — NORTRIPTYLINE HCL 10 MG CAP: 10 | 30 days supply | Qty: 60 | Fill #4

## 2017-11-25 ENCOUNTER — Telehealth: Payer: Self-pay | Admitting: Neurology

## 2017-11-25 MED ORDER — PROPRANOLOL HCL ER 60 MG PO CP24: 60.0000 mg | ORAL_CAPSULE | Freq: Every day | ORAL | 11 refills | Status: DC

## 2017-11-25 MED FILL — buPROPion HCL ER (XL) 150 M: 150 | 30 days supply | Qty: 30 | Fill #5

## 2017-11-25 MED FILL — PROPRANOLOL ER 60 MG CAP: 60 | 30 days supply | Qty: 30 | Fill #0

## 2017-11-25 NOTE — Telephone Encounter (Signed)
Pt requesting a call to give an update on medication. Pt did not wish to discuss with me.

## 2017-11-25 NOTE — Telephone Encounter (Signed)
Returned call to patient.  Reports improvement in her orthostatic hypotension since starting Florinef 0.1mg , TID.  She is still staying close to home but plans to attempt a shopping trip soon.    She is concerned about an increase in headaches since starting the medication.  She is currently taking nortriptyline 10mg , two capsules at bedtime.  Per vo by Dr. Terrace ArabiaYan, offer Inderal LA 60mg , one capsule at bedtime.  The patient is agreeable to this plan and a new prescription has been sent to the pharmacy.

## 2017-11-25 NOTE — Telephone Encounter (Signed)
Error

## 2017-11-25 NOTE — Addendum Note (Signed)
Addended by: Lindell SparKIRKMAN, MICHELLE C on: 11/25/2017 11:52 AM   Modules accepted: Orders

## 2017-12-02 NOTE — Telephone Encounter (Signed)
Pt said the Inderal worked for about 1 week but the HA's returned. Please call to advise. Pt is aware the clinic closes at noon today and Dr Terrace ArabiaYan is not in the office today either. This can wait until Monday. She can be reached at [phone]

## 2017-12-02 NOTE — Telephone Encounter (Signed)
I looked at her last documented vitals, systolic BP was good, and HR was well above 60. She may benefit from ajovy or aimovig?

## 2017-12-02 NOTE — Telephone Encounter (Signed)
Spoke with pt.  H/A's have been less frequent with Inderal LA 60mg  qhs, until 3 days ago.  Has had a h/a each of the last 3 days, no trouble with cognition. Speech clear and deliberate. Pt. answers questions appropriately and sts. she has taken Imitrex once yesterday and once today, with complete resolution of h/a, but h/a then returns after a few hrs. [facility] and poor response to meds tried by Dr. Terrace ArabiaYan (fludrocortisone and Mestinon) but has not heard back about scheduling an appt.  I'll see if Annabelle HarmanDana can reach out to [facility] again to get this pt. scheduled. After speaking with Dr. Vickey Hugerohmeier, she is concerned that increasing Inderal could lower her pulse enough to cause syncope. Pt. does not routinely check her heart rate and doesn't know what it usually runs.  Per Dr. Vickey Hugerohmeier, pt. to repeat Imitrex today and drink plenty of water to ensure hydration.  Will discuss further with Dr. Terrace ArabiaYan when she returns to the office on Monday, since she is most familiar with this pt., to see if there are other changes she would suggest.  Pt. is agreeable with this plan/fim

## 2017-12-05 MED FILL — FLUDROCORTISONE 0.1 MG TAB: 0.1 | 30 days supply | Qty: 90 | Fill #1

## 2017-12-05 MED FILL — PYRIDOSTIGMINE BR 60 MG TAB: 60 | 30 days supply | Qty: 90 | Fill #4

## 2017-12-05 MED FILL — ZOLPIDEM TARTRATE 10 MG TAB: 10 | 30 days supply | Qty: 30 | Fill #1

## 2017-12-05 MED FILL — SUMATRIPTAN SUCC 50 MG TAB: 50 | 30 days supply | Qty: 12 | Fill #4

## 2017-12-05 NOTE — Telephone Encounter (Signed)
Please ask her more headache related symptoms, frequency, duration of headaches, if her headache has significant migraine features, we may consider Botox injection as prevention

## 2017-12-05 NOTE — Telephone Encounter (Addendum)
I spoke to Mallory Durham who reports the following symptoms with her headaches:  Hard, sharp, pain in right temporal area, nausea and light sensitivity.  She is agreeable to Botox but would like something to use for pain, in addition to her Imitrex, while waiting for it to be approved.  She has sparingly used Excedrin Migraine with limited relief.

## 2017-12-05 NOTE — Telephone Encounter (Signed)
I called and spoke to Dr. Carloyn JaegerSiddiqui's office at [phone]  And they relayed to me that they had spoke to patient and told her she needed apt with vascular per Dr. Carloyn JaegerSiddiqui's  and they will call patient to schedule. Patient is aware and we looking  Out for there call.

## 2017-12-05 NOTE — Telephone Encounter (Signed)
Pt has called for RN Marcelino DusterMichelle asking for a call back to discuss that she is having more and more head aches since last week and all weekend.  Please call

## 2017-12-06 ENCOUNTER — Telehealth: Payer: Self-pay | Admitting: *Deleted

## 2017-12-06 DIAGNOSIS — G43709 Chronic migraine without aura, not intractable, without status migrainosus: Secondary | ICD-10-CM

## 2017-12-06 DIAGNOSIS — IMO0002 Reserved for concepts with insufficient information to code with codable children: Secondary | ICD-10-CM

## 2017-12-06 MED ORDER — ONDANSETRON HCL 4 MG PO TABS: 4.0000 mg | ORAL_TABLET | Freq: Three times a day (TID) | ORAL | 1 refills | Status: DC | PRN

## 2017-12-06 MED ORDER — TRAMADOL HCL 50 MG PO TABS: 50.0000 mg | ORAL_TABLET | Freq: Three times a day (TID) | ORAL | 1 refills | Status: DC | PRN

## 2017-12-06 MED ORDER — TIZANIDINE HCL 2 MG PO TABS: 2.0000 mg | ORAL_TABLET | Freq: Three times a day (TID) | ORAL | 1 refills | Status: DC | PRN

## 2017-12-06 MED FILL — traMADol HCL 50 MG TABS: 50 | 10 days supply | Qty: 30 | Fill #0

## 2017-12-06 MED FILL — tiZANidine HCL 2 MG TABS: 2 | 10 days supply | Qty: 30 | Fill #0

## 2017-12-06 MED FILL — ONDANSETRON HCL 4 MG TABLET: 4 | 10 days supply | Qty: 30 | Fill #0

## 2017-12-06 NOTE — Addendum Note (Signed)
Addended by: Lindell SparKIRKMAN, Unice Vantassel C on: 12/06/2017 10:13 AM   Modules accepted: Orders

## 2017-12-06 NOTE — Telephone Encounter (Addendum)
Per vo by Dr. Terrace ArabiaYan, provide prescriptions for the following medications to take at home for migraines:  1) tizanidine 2mg , one tablet q8h, prn 2) ondansetron 4mg , one tablet q8h, prn 3) tramadol 50mg , one tablet q8h, prn  She is agreeable to these medications.  She is aware that they can be taken alone or together, along with her sumatriptan, depending on the severity of her migraine. She is also aware that some of the medications may cause her to feel drowsy and she should not drive until she knows how they affect her to ensure safety.  It is best for her to lay down to rest to allow them to work on the migraine.  She verbalized understanding.

## 2017-12-06 NOTE — Telephone Encounter (Signed)
Gave new patient packet to Jabil CircuitDanielle Durham. For botox.

## 2017-12-06 NOTE — Addendum Note (Signed)
Addended by: Levert FeinsteinYAN, Wynonia Medero on: 12/06/2017 10:22 AM   Modules accepted: Orders

## 2017-12-07 MED ORDER — ONABOTULINUMTOXINA 100 UNITS IJ SOLR: INTRAMUSCULAR | 3 refills | Status: AC

## 2017-12-07 NOTE — Telephone Encounter (Signed)
I called the patient and scheduled the apt with her. [employer] paperwork has been given to Dr. Anne HahnWillis for signature.   Kara MeadEmma, could you [facility]?

## 2017-12-07 NOTE — Telephone Encounter (Signed)
This is a Dr. Terrace ArabiaYan pt. Will complete paperwork and have Dr. Terrace ArabiaYan sign.

## 2017-12-07 NOTE — Telephone Encounter (Signed)
Mallory Durham, I called to schedule patient but the first available is not until October, she has an apt October 1st, should we just injection that this one?

## 2017-12-07 NOTE — Addendum Note (Signed)
Addended by: Eilene GhaziKING, EMMA L on: 12/07/2017 11:42 AM   Modules accepted: Orders

## 2017-12-08 NOTE — Telephone Encounter (Signed)
Mallory Durham, patient would like September 9th at 10:30 am, I am unable to scheduled, would you put her in that slot? Thank you.

## 2017-12-08 NOTE — Telephone Encounter (Signed)
She has been added to the schedule

## 2017-12-13 NOTE — Telephone Encounter (Signed)
Paperwork has been sent to [employer] for authorization.

## 2017-12-14 DIAGNOSIS — H01001 Unspecified blepharitis right upper eyelid: Secondary | ICD-10-CM | POA: Diagnosis not present

## 2017-12-14 DIAGNOSIS — H40013 Open angle with borderline findings, low risk, bilateral: Secondary | ICD-10-CM | POA: Diagnosis not present

## 2017-12-14 DIAGNOSIS — H353131 Nonexudative age-related macular degeneration, bilateral, early dry stage: Secondary | ICD-10-CM | POA: Diagnosis not present

## 2017-12-14 DIAGNOSIS — H04123 Dry eye syndrome of bilateral lacrimal glands: Secondary | ICD-10-CM | POA: Diagnosis not present

## 2017-12-14 DIAGNOSIS — H01004 Unspecified blepharitis left upper eyelid: Secondary | ICD-10-CM | POA: Diagnosis not present

## 2017-12-19 ENCOUNTER — Telehealth: Payer: Self-pay | Admitting: Neurology

## 2017-12-19 DIAGNOSIS — I951 Orthostatic hypotension: Secondary | ICD-10-CM

## 2017-12-19 NOTE — Telephone Encounter (Signed)
Dr. Clinton SawyerSiddqui , Has reviewed records  And patient needs to be seen in vascular dept for her orthostatic hypotension Dr. Terrace ArabiaYan needs to place referral to Att.  Inetta Fermoina Telephone [phone] fax [phone].   Dr. Clinton SawyerSiddqui also relayed [facility] . Dr. Terrace ArabiaYan I [facility] and they don't either please advise .

## 2017-12-19 NOTE — Telephone Encounter (Signed)
Patient called and had questions about where we would get her medication from. I informed her that it would come from our office stock since she has medicare and medicare supplement. I explained that we would bill her insurances for the medication after the fact.  [employer] follows medicare guidelines via their automated system. Called on 12/19/2017 at 2:13pm.

## 2017-12-19 NOTE — Telephone Encounter (Signed)
error 

## 2017-12-20 NOTE — Telephone Encounter (Signed)
Mallory Durham [facility] Please visit the Physician Referral page for detailed information about referrals or call [phone].

## 2017-12-20 NOTE — Telephone Encounter (Signed)
Please place order for vascular for  ororthostatic hypotension .  Please advise me where you want me to send her for automatic failure ?

## 2017-12-20 NOTE — Telephone Encounter (Addendum)
Mallory Cleverlyuke Camille G. Frazier-Mills, MD, [facility]  Is not taking any outside referral's . Her office referred me to.- [place]. [place]h [place]arek [place] he can see for orthostatic hypotension, autonomic failure Telephone  [phone]. Doctor is booking out until Nov.   Dr. Terrace ArabiaYan are you ok with this ? I need to call patient

## 2017-12-20 NOTE — Telephone Encounter (Signed)
Dr. Terrace ArabiaYan please advise me are you doing a vascular referral ?

## 2017-12-20 NOTE — Telephone Encounter (Signed)
Called and spoke to patient and relayed referral -[facility] [place] [facility]  Please visit the Physician Referral page for detailed information about referrals or call [phone].  Patient wanted me to call and tell her and daughter I did that order faxed to [phone]

## 2017-12-20 NOTE — Addendum Note (Signed)
Addended by: Levert FeinsteinYAN, Marylene Masek on: 12/20/2017 08:20 AM   Modules accepted: Orders

## 2017-12-21 MED FILL — NORTRIPTYLINE HCL 10 MG CAP: 10 | 30 days supply | Qty: 60 | Fill #5

## 2017-12-21 MED FILL — buPROPion HCL ER (XL) 150 M: 150 | 30 days supply | Qty: 30 | Fill #0

## 2017-12-21 MED FILL — PROPRANOLOL ER 60 MG CAP: 60 | 30 days supply | Qty: 30 | Fill #1

## 2017-12-21 MED FILL — VIT D2 1.25 MG (50,000 UNIT: 1.25 MG | 28 days supply | Qty: 4 | Fill #5

## 2017-12-21 MED FILL — GABAPENTIN 600 MG TABS: 600 | 30 days supply | Qty: 60 | Fill #5

## 2017-12-27 ENCOUNTER — Ambulatory Visit: Payer: 59 | Admitting: Cardiology

## 2017-12-27 MED FILL — SUMATRIPTAN SUCC 100 MG TAB: 100 | 30 days supply | Qty: 12 | Fill #1

## 2018-01-02 MED FILL — SERTRALINE HCL 100 MG TAB: 100 | 30 days supply | Qty: 45 | Fill #5

## 2018-01-02 MED FILL — SYNTHROID 100 MCG TABLET: 100 | 45 days supply | Qty: 45 | Fill #2

## 2018-01-04 ENCOUNTER — Ambulatory Visit: Payer: Medicare Other | Admitting: Neurology

## 2018-01-05 ENCOUNTER — Encounter: Payer: Self-pay | Admitting: Cardiovascular Disease

## 2018-01-17 MED FILL — SIMVASTATIN 20 MG TABLET: 20 | 90 days supply | Qty: 90 | Fill #0

## 2018-01-17 MED FILL — ZOLPIDEM TARTRATE 10 MG TAB: 10 | 30 days supply | Qty: 30 | Fill #2

## 2018-01-17 MED FILL — PYRIDOSTIGMINE BR 60 MG TAB: 60 | 30 days supply | Qty: 90 | Fill #5

## 2018-01-17 MED FILL — FLUDROCORTISONE 0.1 MG TAB: 0.1 | 30 days supply | Qty: 90 | Fill #2

## 2018-01-18 ENCOUNTER — Telehealth: Payer: Self-pay | Admitting: Neurology

## 2018-01-18 NOTE — Telephone Encounter (Signed)
Spoke to patient - she wanted to know if she could drive home after receiving Botox injections for her migraines.  She is aware that she should be able to drive.  However, since this is her first injection and she is nervous, it may be better to have someone with her just in case.  She verbalized understanding.  She also wanted to check on her referral to [facility].  A separate message has been sent to our referral dept to check on this for her.

## 2018-01-18 NOTE — Telephone Encounter (Signed)
Pt is asking for a call.  She is wanting to know what to expect from the Botox appointment.  Pt states she has never had Botox and has questions as far as will she feel up to driving.  Please call to discuss

## 2018-01-19 NOTE — Telephone Encounter (Signed)
Patient scheduled at [facility] with [facility] Sept. 24 th arrive at 12:45 for 1:00 apt .  Telephone [phone]- fax [phone].  [address] [place] [address] .  I have talked to patient with details. I have also called her daughter Angelique Blonder and left her a message.

## 2018-01-20 DIAGNOSIS — N390 Urinary tract infection, site not specified: Secondary | ICD-10-CM | POA: Diagnosis not present

## 2018-01-20 DIAGNOSIS — R300 Dysuria: Secondary | ICD-10-CM | POA: Diagnosis not present

## 2018-01-20 DIAGNOSIS — R319 Hematuria, unspecified: Secondary | ICD-10-CM | POA: Diagnosis not present

## 2018-01-20 MED FILL — CIPROFLOXACIN HCL 250 MG TA: 250 | 7 days supply | Qty: 14 | Fill #0

## 2018-01-23 ENCOUNTER — Ambulatory Visit (INDEPENDENT_AMBULATORY_CARE_PROVIDER_SITE_OTHER): Payer: Medicare Other | Admitting: Neurology

## 2018-01-23 ENCOUNTER — Encounter: Payer: Self-pay | Admitting: Neurology

## 2018-01-23 VITALS — BP 102/60 | HR 58 | Ht 67.0 in | Wt 201.0 lb

## 2018-01-23 DIAGNOSIS — IMO0002 Reserved for concepts with insufficient information to code with codable children: Secondary | ICD-10-CM

## 2018-01-23 DIAGNOSIS — G43709 Chronic migraine without aura, not intractable, without status migrainosus: Secondary | ICD-10-CM

## 2018-01-23 MED ORDER — ONABOTULINUMTOXINA 100 UNITS IJ SOLR
200.0000 [IU] | Freq: Once | INTRAMUSCULAR | Status: AC
  Administered 2018-01-23: 200 [IU] via INTRAMUSCULAR

## 2018-01-23 NOTE — Progress Notes (Signed)
PATIENT: Mallory Durham DOB: 08-15-1940  Chief Complaint  Patient presents with  . Migraine    Botox 100 units x 2 vials - office supply (1st injection)     HISTORICAL  Mallory Durham is a 77 year old female, seen in refer by primary care PA Fortino Sic, for evaluation of migraine headaches, chronic neck pain, initial evaluation was on June 01, 2017.  Reviewed and summarized the referring note, she has past medical history of hypothyroidism, on supplement, hyperlipidemia, chronic neck pain, chronic renal insufficiency,  Since 2016, she noticed progressive worsening dizziness, to the point now, she sits down most of the time, is not able to exercise, in addition, she is under a lot of stress, tried to see her husband at the nursing home on a daily basis.  She drinks 2 of 12 ounce of water bottle every day, but she complains lightheadedness shortly after standing up, mild improvement walking short distance, but with prolonged walking and standing, she would develop this washout sensation, lightheadedness, as if she is going to faint, she has to sit down to improve her symptoms.  During today's examination, she was found to have significant orthostatic blood pressure change in the setting of already low blood pressure, lying down blood pressure 97/70, heart rate of 83, standing up systolic blood pressure was 50 or less, required multiple measurement to register, she does become symptomatic, especially after standing up for more than 3 minutes.  Her symptoms gradually improved after lying flat.  Previously she was given prescription of midodrine 2.5 mg to get in the early morning, and before bed, there was no significant improvement noted.  She also noted gradual onset memory loss, difficulty focusing, she has history of migraine headache, increased migraine over the past few months, she could not tolerate Imitrex 100 mg as needed due to heart palpitation, she usually take half of  100 mg tablets, if needed, she may repeat in couple hours, which take away the headache in few hours,  She is quite disabled by her dizziness, has to stay seated most of the time, she denies bilateral lower extremity swelling, no significant sensory loss.  UPDATE July 18 2017: MRI of the brain in February 2019, generalized atrophy, especially at the right perisylvian fissure area, mild supratentorium small vessel disease, there is no acute abnormality.  Laboratory evaluation showed A1c of 5.2, normal negative ANA, copper, C-reactive protein, ESR, RPR, B12, CMP, lipid profile, BNP was elevated 246   Significant low vitamin D 5.2,  Echocardiogram showed ejection fraction of 55-60%, wall motion was normal, aortic valve showed no stenosis, ventricular size was normal, systolic function was normal  She is taking gabapentin 659m bid, midodrine 10 mg 3 times a day, last dose was before she goes to bed, she also complains of chronic insomnia, taking Ambien 10 mg at nighttime  She still has depression, anxiety, she has frequent headaches, once or twice each day, she has been 50 mg as needed, 50 mg or Maxalt as needed for headaches, which helps her headache some  UPDATE November 01 2017: She is accompanied by her daughter [name] today's clinical visit, functional status has much declined, despite midodrine 10 mg 3 times a day, and the Mestinon 60 mg 3 times a day, she complains of lightheadedness, dizziness when getting up just a few minutes, today she had significant orthostatic pressure dropped fNTIR/443/15to systolic 60 after standing up, difficult to register diastolic pressure.   She complains lightheadedness fainting sensation after  lying down for a few minutes,blood pressure points back to 120/80, her symptoms has much improved  She also complains of frequent migraine headaches, sometimes woke up with migraine headaches, taking Imitrex as needed  Chronic insomnia, Ambien 5 to 10 mg as  needed  UPDATE Sept 9 2019: She is now taking fludrocortisone 0.1 mg 3 times a day, which has really helped her symptoms, along with Mestinon 60 mg daily, she is scheduled to see [facility] on February 07, 2018,  She complains of chronic migraine headaches for many years,3-4 times each week, right lateralized severe pounding headaches, with associated light noise sensitivity, Imitrex 100 mg as needed was helpful, still her headache last about 6 hours,  REVIEW OF SYSTEMS: Full 14 system review of systems performed and notable only for fatigue, hearing loss, runny nose, light sensitivity, nausea, vomiting, insomnia, achy muscles, bruise easily, dizziness, headaches, weakness, passing out  ALLERGIES: No Known Allergies   HOME MEDICATIONS: Current Outpatient Medications  Medication Sig Dispense Refill  . albuterol (PROVENTIL HFA;VENTOLIN HFA) 108 (90 Base) MCG/ACT inhaler Inhale 2 puffs into the lungs every 6 (six) hours as needed for wheezing or shortness of breath. 1 Inhaler 0  . botulinum toxin Type A (BOTOX) 100 units SOLR injection Inject IM every 3 months by MD in the office 2 vial 3  . buPROPion (WELLBUTRIN XL) 150 MG 24 hr tablet Take 1 tablet (150 mg total) by mouth daily. 30 tablet 0  . fludrocortisone (FLORINEF) 0.1 MG tablet Take 1 tablet (0.1 mg total) by mouth 3 (three) times daily. 90 tablet 6  . fluticasone (FLONASE) 50 MCG/ACT nasal spray Place 2 sprays into both nostrils daily. 16 g 1  . fluticasone (FLOVENT HFA) 110 MCG/ACT inhaler Inhale 2 puffs into the lungs 2 (two) times daily. 1 Inhaler 1  . gabapentin (NEURONTIN) 600 MG tablet Take 600 mg by mouth at bedtime.     . Multiple Vitamins-Minerals (PRESERVISION AREDS 2) CAPS Take 2 capsules by mouth.    . nortriptyline (PAMELOR) 10 MG capsule Take 2 capsules (20 mg total) by mouth at bedtime. 60 capsule 11  . ondansetron (ZOFRAN) 4 MG tablet Take 1 tablet (4 mg total) by mouth every 8 (eight) hours as needed  for nausea or vomiting. 30 tablet 1  . phenazopyridine (PYRIDIUM) 100 MG tablet Take 1 tablet (100 mg total) by mouth 3 (three) times daily as needed for pain. 10 tablet 0  . propranolol ER (INDERAL LA) 60 MG 24 hr capsule Take 1 capsule (60 mg total) by mouth at bedtime. 30 capsule 11  . pyridostigmine (MESTINON) 60 MG tablet Take 1 tablet (60 mg total) by mouth 3 (three) times daily. 90 tablet 11  . simvastatin (ZOCOR) 20 MG tablet Take 1 tablet (20 mg total) by mouth every evening. 90 tablet 1  . SUMAtriptan (IMITREX) 100 MG tablet Take 100 mg by mouth every 2 (two) hours as needed for migraine. May repeat in 2 hours if headache persists or recurs.    Marland Kitchen SYNTHROID 100 MCG tablet Take 1 tablet (100 mcg total) by mouth daily before breakfast. 45 tablet 5  . tiZANidine (ZANAFLEX) 2 MG tablet Take 1 tablet (2 mg total) by mouth every 8 (eight) hours as needed for muscle spasms. 30 tablet 1  . Vitamin D, Ergocalciferol, (DRISDOL) 50000 units CAPS capsule Take 1 capsule (50,000 Units total) by mouth every 7 (seven) days. 30 capsule 0  . zolpidem (AMBIEN) 10 MG tablet Take 0.5-1 tablets (  5-10 mg total) by mouth at bedtime as needed. 30 tablet 5   No current facility-administered medications for this visit.     PAST MEDICAL HISTORY: Past Medical History:  Diagnosis Date  . Anxiety   . Confusion, hx of, without neuro findings   . Depression   . Hx of vertigo   . Hyperlipidemia   . Hypothyroid   . Left shoulder pain   . Migraines   . Neck pain   . Orthostatic hypotension   . Renal insufficiency     PAST SURGICAL HISTORY: Past Surgical History:  Procedure Laterality Date  . ABDOMINAL HYSTERECTOMY    . ABDOMINAL HYSTERECTOMY    . BREAST LUMPECTOMY    . CHOLECYSTECTOMY    . JOINT REPLACEMENT    . THYROID SURGERY    . TONSILLECTOMY      FAMILY HISTORY: Family History  Problem Relation Age of Onset  . Heart disease Mother        Angina  . Cancer Mother   . Cancer Father   . Heart  disease Father   . Cancer Maternal Aunt     SOCIAL HISTORY:  Social History   Socioeconomic History  . Marital status: Married    Spouse name: Not on file  . Number of children: 2  . Years of education: 16  . Highest education level: Bachelor's degree (e.g., BA, AB, BS)  Occupational History    Employer: RETIRED  Social Needs  . Financial resource strain: Not on file  . Food insecurity:    Worry: Not on file    Inability: Not on file  . Transportation needs:    Medical: Not on file    Non-medical: Not on file  Tobacco Use  . Smoking status: Never Smoker  . Smokeless tobacco: Never Used  Substance and Sexual Activity  . Alcohol use: No  . Drug use: No  . Sexual activity: Not on file  Lifestyle  . Physical activity:    Days per week: Not on file    Minutes per session: Not on file  . Stress: Not on file  Relationships  . Social connections:    Talks on phone: Not on file    Gets together: Not on file    Attends religious service: Not on file    Active member of club or organization: Not on file    Attends meetings of clubs or organizations: Not on file    Relationship status: Not on file  . Intimate partner violence:    Fear of current or ex partner: Not on file    Emotionally abused: Not on file    Physically abused: Not on file    Forced sexual activity: Not on file  Other Topics Concern  . Not on file  Social History Narrative   Lives at home alone.   Right-handed.   2-3 cups caffeine per day.     PHYSICAL EXAM Blood pressure lying down 148/74, heart rate of 78, standing up was 120/80  Body mass index is 31.48 kg/m.  PHYSICAL EXAMNIATION:  Gen: NAD, conversant, well nourised, obese, well groomed                     Cardiovascular: Regular rate rhythm, no peripheral edema, warm, nontender. Eyes: Conjunctivae clear without exudates or hemorrhage Neck: Supple, no carotid bruits. Pulmonary: Clear to auscultation bilaterally   NEUROLOGICAL  EXAM:  MENTAL STATUS: Speech:    Speech is normal; fluent and spontaneous with  normal comprehension.  Cognition:     Orientation to time, place and person     Normal recent and remote memory     Normal Attention span and concentration     Normal Language, naming, repeating,spontaneous speech     Fund of knowledge   CRANIAL NERVES: CN II: Visual fields are full to confrontation. Fundoscopic exam is normal with sharp discs and no vascular changes. Pupils are round equal and briskly reactive to light. CN III, IV, VI: extraocular movement are normal. No ptosis. CN V: Facial sensation is intact to pinprick in all 3 divisions bilaterally. Corneal responses are intact.  CN VII: Face is symmetric with normal eye closure and smile. CN VIII: Hearing is normal to rubbing fingers CN IX, X: Palate elevates symmetrically. Phonation is normal. CN XI: Head turning and shoulder shrug are intact CN XII: Tongue is midline with normal movements and no atrophy.  MOTOR: There is no pronator drift of out-stretched arms. Muscle bulk and tone are normal. Muscle strength is normal.  REFLEXES: Reflexes are 2+ and symmetric at the biceps, triceps, knees, and ankles. Plantar responses are flexor.  SENSORY: Mildly less dependent sensory changes to pinprick.  COORDINATION: Rapid alternating movements and fine finger movements are intact. There is no dysmetria on finger-to-nose and heel-knee-shin.    GAIT/STANCE: She can get up without pushing on chair arm, steady, Romberg is absent.   DIAGNOSTIC DATA (LABS, IMAGING, TESTING) - I reviewed patient records, labs, notes, testing and imaging myself where available.   ASSESSMENT AND PLAN  Lolita Faulds is a 77 y.o. female   Autonomic failure  With significant orthostatic blood pressure changes,  No treatable etiology found, most consistent with central nervous system degenerative disorder  She also reported pseudobulbar phenomena  Patient is severely  symptomatic from her profound autonomic failure, significant orthostatic blood pressure changes, essentially disabled from her symptoms,   Will add on Fludrocortisone 0.1 mg tid, if she remains significantly symptomatic,  We will add on Northera, I gave her 100 mg tablets sample, titrating to 3 tablets 3 times a day if fludrocortisone does not provide significant improvement  Refer her to [facility] for second opinion  Stop midodrine, continue Mestinon 60 mg 3 times a day  Chronic migraine headaches  Imitrex 50 mg as needed  Botox injection for chronic migraine prevention, injection was performed according to [place] protocol,  5 units of Botox was injected into each side, for 31 injection sites, total of 155 units  Bilateral frontalis 4 injection sites Bilateral corrugate 2 injection sites Procerus 1 injection sites. Bilateral temporalis 8 injection sites Bilateral occipitalis 6 injection sites Bilateral cervical paraspinals 4 injection sites Bilateral upper trapezius 6 injection sites  Extra 45 unites were injected into right temporoparietal region  Marcial Pacas, M.D. Ph.D.  [facility] [address], [place], [place] [address] Ph: [phone] Fax: [phone]  CC: Fortino Sic, [place]

## 2018-01-23 NOTE — Progress Notes (Signed)
**  Botox 100 units x 2 vials, NDC 0023-1145-01, Lot [id number], Exp 07/2020, office supply.//mck,rn** 

## 2018-01-25 MED FILL — VIT D2 1.25 MG (50,000 UNIT: 1.25 MG | 28 days supply | Qty: 4 | Fill #6

## 2018-01-25 MED FILL — PROPRANOLOL ER 60 MG CAP: 60 | 30 days supply | Qty: 30 | Fill #2

## 2018-01-25 MED FILL — NORTRIPTYLINE HCL 10 MG CAP: 10 | 30 days supply | Qty: 60 | Fill #6

## 2018-01-25 MED FILL — buPROPion HCL ER (XL) 150 M: 150 | 30 days supply | Qty: 30 | Fill #1

## 2018-01-30 ENCOUNTER — Encounter: Payer: Self-pay | Admitting: Cardiovascular Disease

## 2018-01-30 ENCOUNTER — Ambulatory Visit (INDEPENDENT_AMBULATORY_CARE_PROVIDER_SITE_OTHER): Payer: Medicare Other | Admitting: Cardiovascular Disease

## 2018-01-30 VITALS — BP 90/68 | HR 75 | Ht 67.0 in | Wt 198.0 lb

## 2018-01-30 DIAGNOSIS — G901 Familial dysautonomia [Riley-Day]: Secondary | ICD-10-CM

## 2018-01-30 DIAGNOSIS — I951 Orthostatic hypotension: Secondary | ICD-10-CM

## 2018-01-30 MED ORDER — MIDODRINE HCL 2.5 MG PO TABS: 2.5000 mg | ORAL_TABLET | Freq: Two times a day (BID) | ORAL | 11 refills | Status: DC

## 2018-01-30 MED FILL — MIDODRINE HCL 2.5 MG TABLET: 2.5 | 30 days supply | Qty: 60 | Fill #0

## 2018-01-30 NOTE — Patient Instructions (Addendum)
Medication Instructions:  Your physician has recommended you make the following change in your medication:   START Midodrine (Proamatine)  2.5 mg twice daily   Labwork: TODAY - basic metabolic panel   Testing/Procedures: None Ordered   Follow-Up: Your physician recommends that you schedule a follow-up appointment in: 1 month with Tereso NewcomerScott Weaver, PA   If you need a refill on your cardiac medications before your next appointment, please call your pharmacy.   Thank you for choosing [facility]! Eligha BridegroomMichelle Swinyer, RN [phone]

## 2018-01-30 NOTE — Progress Notes (Signed)
Cardiology Office Note:    Date:  01/30/2018   ID:  Mallory Durham, DOB October 25, 1940, MRN [id number]  PCP:  Mallory Dales, PA  Cardiologist:   Previous Mallory Durham , now Mallory Durham  Electrophysiologist:  None   Referring MD: Mallory Feinstein, MD   1.  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  Currently on Mestinon 2.  Orthostatic hypotension 3.  Hypothyroidism  Chief Complaint  Patient presents with  . Dizziness     Sept. 16, 2019   Mallory Durham is a 77 y.o. female with a hx of dysautonomia.  This was diagnosed this past January.  Managed by neurology.  She is currently on Mestinon.   Has had a long history of orthostatic hypotension .  Currently on Florinef 0.1 mg 3 times a day.  The florinef has helped.   The Mestinon has not helped.  Neuro had discussed. Drox - Dopa ( which is $8000 per month)   No CP  Has dyspnea and dizziness when her BP drops Does oK while she is sitting in a chair Has episodes of near syncope .   Was much worse before florinev   Echocardiogram in February, 2019 reveals normal left ventricular systolic function.  She has grade 2 diastolic dysfunction.  There is mild aortic insufficiency.  No benefit from Midodrine  On no BP lowering meds .  Eats lots of salt   Past Medical History:  Diagnosis Date  . Anxiety   . Confusion, hx of, without neuro findings   . Depression   . Hx of vertigo   . Hyperlipidemia   . Hypothyroid   . Left shoulder pain   . Migraines   . Neck pain   . Orthostatic hypotension   . Renal insufficiency     Past Surgical History:  Procedure Laterality Date  . ABDOMINAL HYSTERECTOMY    . ABDOMINAL HYSTERECTOMY    . BREAST LUMPECTOMY    . CHOLECYSTECTOMY    . JOINT REPLACEMENT    . THYROID SURGERY    . TONSILLECTOMY      Current Medications: Current Meds  Medication Sig  . albuterol (PROVENTIL HFA;VENTOLIN HFA) 108 (90 Base) MCG/ACT inhaler Inhale 2 puffs into the lungs every 6 (six) hours as needed for wheezing or  shortness of breath.  . botulinum toxin Type A (BOTOX) 100 units SOLR injection Inject IM every 3 months by MD in the office  . buPROPion (WELLBUTRIN XL) 150 MG 24 hr tablet Take 1 tablet (150 mg total) by mouth daily.  . fludrocortisone (FLORINEF) 0.1 MG tablet Take 1 tablet (0.1 mg total) by mouth 3 (three) times daily.  . fluticasone (FLONASE) 50 MCG/ACT nasal spray Place into both nostrils daily as needed for allergies or rhinitis.  . fluticasone (FLOVENT HFA) 110 MCG/ACT inhaler Inhale 2 puffs into the lungs 2 (two) times daily as needed [facility]).  Marland Kitchen gabapentin (NEURONTIN) 600 MG tablet Take 600 mg by mouth at bedtime.   . Multiple Vitamins-Minerals (PRESERVISION AREDS 2) CAPS Take 2 capsules by mouth.  . nortriptyline (PAMELOR) 10 MG capsule Take 2 capsules (20 mg total) by mouth at bedtime.  . ondansetron (ZOFRAN) 4 MG tablet Take 1 tablet (4 mg total) by mouth every 8 (eight) hours as needed for nausea or vomiting.  . phenazopyridine (PYRIDIUM) 100 MG tablet Take 1 tablet (100 mg total) by mouth 3 (three) times daily as needed for pain.  Marland Kitchen propranolol ER (INDERAL LA) 60 MG 24 hr capsule Take 1 capsule (60 mg total) by  mouth at bedtime.  . pyridostigmine (MESTINON) 60 MG tablet Take 1 tablet (60 mg total) by mouth 3 (three) times daily.  . sertraline (ZOLOFT) 100 MG tablet Take 100 mg by mouth daily.  . simvastatin (ZOCOR) 20 MG tablet Take 1 tablet (20 mg total) by mouth every evening.  . SUMAtriptan (IMITREX) 100 MG tablet Take 100 mg by mouth every 2 (two) hours as needed for migraine. May repeat in 2 hours if headache persists or recurs.  Marland Kitchen SYNTHROID 100 MCG tablet Take 1 tablet (100 mcg total) by mouth daily before breakfast.  . tiZANidine (ZANAFLEX) 2 MG tablet Take 1 tablet (2 mg total) by mouth every 8 (eight) hours as needed for muscle spasms.  . Vitamin D, Ergocalciferol, (DRISDOL) 50000 units CAPS capsule Take 1 capsule (50,000 Units total) by mouth every 7 (seven) days.  Marland Kitchen  zolpidem (AMBIEN) 10 MG tablet Take 0.5-1 tablets (5-10 mg total) by mouth at bedtime as needed.     Allergies:   Patient has no known allergies.   Social History   Socioeconomic History  . Marital status: Married    Spouse name: Not on file  . Number of children: 2  . Years of education: 16  . Highest education level: Bachelor's degree (e.g., BA, AB, BS)  Occupational History    Employer: RETIRED  Social Needs  . Financial resource strain: Not on file  . Food insecurity:    Worry: Not on file    Inability: Not on file  . Transportation needs:    Medical: Not on file    Non-medical: Not on file  Tobacco Use  . Smoking status: Never Smoker  . Smokeless tobacco: Never Used  Substance and Sexual Activity  . Alcohol use: No  . Drug use: No  . Sexual activity: Not on file  Lifestyle  . Physical activity:    Days per week: Not on file    Minutes per session: Not on file  . Stress: Not on file  Relationships  . Social connections:    Talks on phone: Not on file    Gets together: Not on file    Attends religious service: Not on file    Active member of club or organization: Not on file    Attends meetings of clubs or organizations: Not on file    Relationship status: Not on file  Other Topics Concern  . Not on file  Social History Narrative   Lives at home alone.   Right-handed.   2-3 cups caffeine per day.     Family History: The patient's family history includes Cancer in her father, maternal aunt, and mother; Heart disease in her father and mother.  ROS:   Please see the history of present illness.     All other systems reviewed and are negative.  EKGs/Labs/Other Studies Reviewed:    The following studies were reviewed today:   EKG:   01/30/18:  NSR .   NS ST changes.   ( performed in seated position)   Recent Labs: 02/02/2017: Pro B Natriuretic peptide (BNP) 246.0 11/15/2017: TSH 0.83  Recent Lipid Panel    Component Value Date/Time   CHOL 151 10/03/2013    TRIG 100 10/03/2013   HDL 61 10/03/2013   LDLCALC 70 10/03/2013    Physical Exam:    VS:  BP 90/68 (Cuff Size: Large)   Pulse 75   Ht 5\' 7"  (1.702 m)   Wt 198 lb (89.8 kg)   SpO2 97%  BMI 31.01 kg/m     Wt Readings from Last 3 Encounters:  01/30/18 198 lb (89.8 kg)  01/23/18 201 lb (91.2 kg)  11/01/17 200 lb (90.7 kg)     GEN:  Well nourished, well developed in no acute distress HEENT: Normal NECK: No JVD; No carotid bruits LYMPHATICS: No lymphadenopathy CARDIAC: RRR, no murmurs, rubs, gallops RESPIRATORY:  Clear to auscultation without rales, wheezing or rhonchi  ABDOMEN: Soft, non-tender, non-distended MUSCULOSKELETAL:  No edema; No deformity  SKIN: Warm and dry NEUROLOGIC:  Alert and oriented x 3 PSYCHIATRIC:  Normal affect   ASSESSMENT:    1. Orthostatic hypotension   2. Dysautonomia (HCC)    PLAN:    In order of problems listed above:  1. Dysautonomia: The patient has been diagnosed with dysautonomia and as result has orthostatic hypotension.  She has felt much better after starting Florinef.  Pressure remains low and she remains very symptomatic.  I would like to start her on Midrin.  She has tried midodrine in the past but it was not combined with the Florinef. We will start her on midodrine 2.5 mg  twice a day.  We will have her come back to see Tereso NewcomerScott Weaver, PA in 1 month. Anticipate increasing the midodrine at that office visit to 5 mg 3 times a day if she seems to be tolerating it well and if she is still having symptoms.  2.  Abnormal EKG: The patient has some ST changes but she is not having any discomfort at present.  She had an episode or orthostasis while out in the lobby  Check BMP  Will repeat the ECG at her next office visit    Medication Adjustments/Labs and Tests Ordered: Current medicines are reviewed at length with the patient today.  Concerns regarding medicines are outlined above.  Orders Placed This Encounter  Procedures  . Basic  Metabolic Panel (BMET)   Meds ordered this encounter  Medications  . midodrine (PROAMATINE) 2.5 MG tablet    Sig: Take 1 tablet (2.5 mg total) by mouth 2 (two) times daily with a meal.    Dispense:  60 tablet    Refill:  11     Patient Instructions  Medication Instructions:  Your physician has recommended you make the following change in your medication:   START Midodrine (Proamatine)  2.5 mg twice daily   Labwork: TODAY - basic metabolic panel   Testing/Procedures: None Ordered   Follow-Up: Your physician recommends that you schedule a follow-up appointment in: 1 month with Tereso NewcomerScott Weaver, PA   If you need a refill on your cardiac medications before your next appointment, please call your pharmacy.   Thank you for choosing [facility]! Eligha BridegroomMichelle Swinyer, RN [phone]       Signed, Kristeen MissPhilip Nahser, MD  01/30/2018 2:47 PM    [facility]

## 2018-01-30 NOTE — Addendum Note (Signed)
Addended by: Jacqlyn KraussOBERTSON, Vianna Venezia on: 01/30/2018 05:03 PM   Modules accepted: Orders

## 2018-01-31 ENCOUNTER — Telehealth: Payer: Self-pay | Admitting: Nurse Practitioner

## 2018-01-31 ENCOUNTER — Other Ambulatory Visit: Payer: Self-pay | Admitting: Nurse Practitioner

## 2018-01-31 DIAGNOSIS — G901 Familial dysautonomia [Riley-Day]: Secondary | ICD-10-CM

## 2018-01-31 DIAGNOSIS — E876 Hypokalemia: Secondary | ICD-10-CM

## 2018-01-31 LAB — BASIC METABOLIC PANEL
BUN/Creatinine Ratio: 7 — ABNORMAL LOW (ref 12–28)
BUN: 7 mg/dL — AB (ref 8–27)
CALCIUM: 8.7 mg/dL (ref 8.7–10.3)
CHLORIDE: 101 mmol/L (ref 96–106)
CO2: 28 mmol/L (ref 20–29)
Creatinine, Ser: 1.04 mg/dL — ABNORMAL HIGH (ref 0.57–1.00)
GFR calc Af Amer: 60 mL/min/{1.73_m2} (ref >59)
GFR calc non Af Amer: 52 mL/min/{1.73_m2} — ABNORMAL LOW (ref >59)
Glucose: 82 mg/dL (ref 65–99)
POTASSIUM: 2.6 mmol/L — AB (ref 3.5–5.2)
Sodium: 146 mmol/L — ABNORMAL HIGH (ref 134–144)

## 2018-01-31 MED ORDER — POTASSIUM CHLORIDE CRYS ER 20 MEQ PO TBCR: EXTENDED_RELEASE_TABLET | ORAL | 3 refills | Status: DC

## 2018-01-31 MED FILL — SUMATRIPTAN SUCC 100 MG TAB: 100 | 30 days supply | Qty: 12 | Fill #2

## 2018-01-31 MED FILL — POTASSIUM CL ER 20 MEQ TAB: 20 | 30 days supply | Qty: 65 | Fill #0

## 2018-01-31 NOTE — Telephone Encounter (Signed)
-----   Message from Vesta MixerPhilip J Nahser, MD sent at 01/31/2018  6:13 AM EDT ----- Potassium level is low - likely due to the Florinef   Please have her take Kdur  20 meq TID today and tomorrow And then Please start Kdur 20 meq BID  Please recheck BMP Friday

## 2018-01-31 NOTE — Telephone Encounter (Signed)
Reviewed lab results and plan of care with patient. [place] and I advised her of the address of the [facility] [place]. She verbalized understanding and agreement with plan of care and understands to take Kdur 20 mEq 3 times per day for the next 2 days then BID. I advised her to call back with questions or concerns and she thanked me for the call.

## 2018-02-01 ENCOUNTER — Telehealth: Payer: Self-pay | Admitting: Nurse Practitioner

## 2018-02-01 NOTE — Telephone Encounter (Signed)
Received call from patient's daughter, Angelique BlonderDenise, who called for information regarding phone call to patient yesterday for lab results. She states her mother did not know how low the K+ level was and was confused about medication instructions. I reviewed the information I gave to the patient with Angelique Blonderenise, including serum K+ level of 2.6 mmol/L and instructions to take Kdur 60 mEq x 2 days, then 40 mEq BID. Angelique BlonderDenise verbalized understanding and agreement to [place] Friday. I advised her to call back with additional questions or concerns and she thanked me for my help.

## 2018-02-03 DIAGNOSIS — E876 Hypokalemia: Secondary | ICD-10-CM | POA: Diagnosis not present

## 2018-02-03 DIAGNOSIS — G901 Familial dysautonomia [Riley-Day]: Secondary | ICD-10-CM | POA: Diagnosis not present

## 2018-02-04 LAB — BASIC METABOLIC PANEL
BUN / CREAT RATIO: 10 — AB (ref 12–28)
BUN: 10 mg/dL (ref 8–27)
CO2: 27 mmol/L (ref 20–29)
Calcium: 8.7 mg/dL (ref 8.7–10.3)
Chloride: 101 mmol/L (ref 96–106)
Creatinine, Ser: 0.97 mg/dL (ref 0.57–1.00)
GFR calc Af Amer: 65 mL/min/{1.73_m2} (ref >59)
GFR calc non Af Amer: 57 mL/min/{1.73_m2} — ABNORMAL LOW (ref >59)
GLUCOSE: 130 mg/dL — AB (ref 65–99)
Potassium: 4.1 mmol/L (ref 3.5–5.2)
SODIUM: 141 mmol/L (ref 134–144)

## 2018-02-06 ENCOUNTER — Telehealth: Payer: Self-pay | Admitting: Cardiovascular Disease

## 2018-02-06 NOTE — Telephone Encounter (Signed)
Left message for patient to call back  

## 2018-02-06 NOTE — Telephone Encounter (Signed)
Reviewed lab results and plan to continue current medications reviewed with patient's daughter, Angelique BlonderDenise. She thanked me for the call.

## 2018-02-06 NOTE — Telephone Encounter (Signed)
Follow up    Patients daughter is returning call from today. Please call to discuss.

## 2018-02-06 NOTE — Telephone Encounter (Signed)
Follow Up:      Returning your call from today. 

## 2018-02-07 DIAGNOSIS — I951 Orthostatic hypotension: Secondary | ICD-10-CM | POA: Diagnosis not present

## 2018-02-07 DIAGNOSIS — E1349 Other specified diabetes mellitus with other diabetic neurological complication: Secondary | ICD-10-CM | POA: Diagnosis not present

## 2018-02-07 DIAGNOSIS — G43709 Chronic migraine without aura, not intractable, without status migrainosus: Secondary | ICD-10-CM | POA: Diagnosis not present

## 2018-02-07 DIAGNOSIS — I959 Hypotension, unspecified: Secondary | ICD-10-CM | POA: Diagnosis not present

## 2018-02-14 ENCOUNTER — Ambulatory Visit: Payer: Medicare Other | Admitting: Neurology

## 2018-02-22 MED FILL — ZOLPIDEM TARTRATE 10 MG TAB: 10 | 30 days supply | Qty: 30 | Fill #3

## 2018-02-22 MED FILL — SYNTHROID 100 MCG TABLET: 100 | 45 days supply | Qty: 45 | Fill #3

## 2018-02-22 MED FILL — PROPRANOLOL ER 60 MG CAP: 60 | 30 days supply | Qty: 30 | Fill #3

## 2018-02-23 MED FILL — SERTRALINE HCL 100 MG TAB: 100 | 30 days supply | Qty: 45 | Fill #0

## 2018-03-06 MED FILL — buPROPion HCL ER (XL) 150 M: 150 | 30 days supply | Qty: 30 | Fill #2

## 2018-03-06 MED FILL — NORTRIPTYLINE HCL 10 MG CAP: 10 | 30 days supply | Qty: 60 | Fill #7

## 2018-03-06 MED FILL — SUMATRIPTAN SUCC 100 MG TAB: 100 | 30 days supply | Qty: 12 | Fill #3

## 2018-03-06 MED FILL — MIDODRINE HCL 2.5 MG TABLET: 2.5 | 30 days supply | Qty: 60 | Fill #1

## 2018-03-06 MED FILL — VIT D2 1.25 MG (50,000 UNIT: 1.25 MG | 13 days supply | Qty: 2 | Fill #7

## 2018-03-06 MED FILL — GABAPENTIN 600 MG TABS: 600 | 30 days supply | Qty: 30 | Fill #0

## 2018-03-10 ENCOUNTER — Ambulatory Visit: Payer: Medicare Other | Admitting: Physician Assistant

## 2018-03-13 DIAGNOSIS — F4323 Adjustment disorder with mixed anxiety and depressed mood: Secondary | ICD-10-CM | POA: Diagnosis not present

## 2018-03-13 DIAGNOSIS — E890 Postprocedural hypothyroidism: Secondary | ICD-10-CM | POA: Diagnosis not present

## 2018-03-13 DIAGNOSIS — E559 Vitamin D deficiency, unspecified: Secondary | ICD-10-CM | POA: Diagnosis not present

## 2018-03-13 DIAGNOSIS — E782 Mixed hyperlipidemia: Secondary | ICD-10-CM | POA: Diagnosis not present

## 2018-03-21 MED FILL — PROPRANOLOL ER 60 MG CAP: 60 | 30 days supply | Qty: 30 | Fill #4

## 2018-03-21 MED FILL — FLUDROCORTISONE 0.1 MG TAB: 0.1 | 30 days supply | Qty: 90 | Fill #3

## 2018-03-21 MED FILL — PYRIDOSTIGMINE BR 60 MG TAB: 60 | 30 days supply | Qty: 90 | Fill #6

## 2018-03-23 MED FILL — SERTRALINE HCL 100 MG TAB: 100 | 30 days supply | Qty: 45 | Fill #0

## 2018-04-03 ENCOUNTER — Encounter: Payer: Self-pay | Admitting: Physician Assistant

## 2018-04-03 ENCOUNTER — Ambulatory Visit (INDEPENDENT_AMBULATORY_CARE_PROVIDER_SITE_OTHER): Payer: Medicare Other | Admitting: Physician Assistant

## 2018-04-03 VITALS — BP 104/68 | HR 69 | Ht 67.0 in | Wt 202.1 lb

## 2018-04-03 DIAGNOSIS — R9431 Abnormal electrocardiogram [ECG] [EKG]: Secondary | ICD-10-CM

## 2018-04-03 DIAGNOSIS — I951 Orthostatic hypotension: Secondary | ICD-10-CM | POA: Diagnosis not present

## 2018-04-03 DIAGNOSIS — G901 Familial dysautonomia [Riley-Day]: Secondary | ICD-10-CM | POA: Diagnosis not present

## 2018-04-03 DIAGNOSIS — E876 Hypokalemia: Secondary | ICD-10-CM

## 2018-04-03 MED ORDER — MIDODRINE HCL 5 MG PO TABS: 5.0000 mg | ORAL_TABLET | Freq: Three times a day (TID) | ORAL | 6 refills | Status: DC

## 2018-04-03 MED FILL — MIDODRINE HCL 5 MG TABLET: 5 | 30 days supply | Qty: 90 | Fill #0

## 2018-04-03 NOTE — Patient Instructions (Signed)
Medication Instructions:  Please increase your Midodrine to 5 mg three times daily. Continue all other medications as listed.  If you need a refill on your cardiac medications before your next appointment, please call your pharmacy.   Lab work: Please have blood work today. (BMP)  If you have labs (blood work) drawn today and your tests are completely normal, you will receive your results only by: Marland Kitchen. MyChart Message (if you have MyChart) OR . A paper copy in the mail If you have any lab test that is abnormal or we need to change your treatment, we will call you to review the results.  Follow-Up: Follow up in 3 months with Dr Elease HashimotoNahser.  Thank you for choosing  [facility]!!

## 2018-04-03 NOTE — Progress Notes (Signed)
Cardiology Office Note    Date:  04/03/2018   ID:  Mallory Durham, DOB [birth year], MRN [id number]  PCP:  Clide Dales, PA  Cardiologist:  Dr. Elease Hashimoto   Chief Complaint: 2 Months follow up  History of Present Illness:   Mallory Durham is a 77 y.o. female with hx of dysautonomia/orthostatic hypotension (followed by neurology) and HLD presented for follow up.   Last seen by Dr. Elease Hashimoto 01/2018. Noted ST changes however no work up recommended given asymptomatic. Noted hypokalemic which improved with Kdur.   Here today for follow up. Symptoms improved since starting midodrine. No chest pain or dyspnea. Denies orthopnea, PND, LE edema, dizziness, melena or syncope. Use walker for balance.   Past Medical History:  Diagnosis Date  . Anxiety   . Confusion, hx of, without neuro findings   . Depression   . Hx of vertigo   . Hyperlipidemia   . Hypothyroid   . Left shoulder pain   . Migraines   . Neck pain   . Orthostatic hypotension   . Renal insufficiency     Past Surgical History:  Procedure Laterality Date  . ABDOMINAL HYSTERECTOMY    . ABDOMINAL HYSTERECTOMY    . BREAST LUMPECTOMY    . CHOLECYSTECTOMY    . JOINT REPLACEMENT    . THYROID SURGERY    . TONSILLECTOMY      Current Medications: Prior to Admission medications   Medication Sig Start Date End Date Taking? Authorizing Provider  albuterol (PROVENTIL HFA;VENTOLIN HFA) 108 (90 Base) MCG/ACT inhaler Inhale 2 puffs into the lungs every 6 (six) hours as needed for wheezing or shortness of breath. 05/27/16   Saguier, Ramon Dredge, PA-C  botulinum toxin Type A (BOTOX) 100 units SOLR injection Inject IM every 3 months by MD in the office 12/07/17   Levert Feinstein, MD  buPROPion (WELLBUTRIN XL) 150 MG 24 hr tablet Take 1 tablet (150 mg total) by mouth daily. 02/04/17   Saguier, Ramon Dredge, PA-C  fludrocortisone (FLORINEF) 0.1 MG tablet Take 1 tablet (0.1 mg total) by mouth 3 (three) times daily. 11/02/17   Levert Feinstein, MD  fluticasone  (FLONASE) 50 MCG/ACT nasal spray Place into both nostrils daily as needed for allergies or rhinitis.    [provider]  fluticasone (FLOVENT HFA) 110 MCG/ACT inhaler Inhale 2 puffs into the lungs 2 (two) times daily as needed [facility]).    [provider]  gabapentin (NEURONTIN) 600 MG tablet Take 600 mg by mouth at bedtime.     [provider]  midodrine (PROAMATINE) 2.5 MG tablet Take 1 tablet (2.5 mg total) by mouth 2 (two) times daily with a meal. 01/30/18   Nahser, Deloris Ping, MD  Multiple Vitamins-Minerals (PRESERVISION AREDS 2) CAPS Take 2 capsules by mouth.    [provider]  nortriptyline (PAMELOR) 10 MG capsule Take 2 capsules (20 mg total) by mouth at bedtime. 07/18/17   Levert Feinstein, MD  ondansetron (ZOFRAN) 4 MG tablet Take 1 tablet (4 mg total) by mouth every 8 (eight) hours as needed for nausea or vomiting. 12/06/17   Levert Feinstein, MD  phenazopyridine (PYRIDIUM) 100 MG tablet Take 1 tablet (100 mg total) by mouth 3 (three) times daily as needed for pain. 04/28/16   Saguier, Ramon Dredge, PA-C  potassium chloride SA (K-DUR,KLOR-CON) 20 MEQ tablet Take 1 pill 3 times per day the first 2 days then 1 pill twice daily everyday after 01/31/18   Nahser, Deloris Ping, MD  propranolol ER (INDERAL LA) 60  MG 24 hr capsule Take 1 capsule (60 mg total) by mouth at bedtime. 11/25/17   Levert Feinstein, MD  pyridostigmine (MESTINON) 60 MG tablet Take 1 tablet (60 mg total) by mouth 3 (three) times daily. 06/01/17   Levert Feinstein, MD  sertraline (ZOLOFT) 100 MG tablet Take 100 mg by mouth daily.    [provider]  simvastatin (ZOCOR) 20 MG tablet Take 1 tablet (20 mg total) by mouth every evening. 03/07/17   Saguier, Ramon Dredge, PA-C  SUMAtriptan (IMITREX) 100 MG tablet Take 100 mg by mouth every 2 (two) hours as needed for migraine. May repeat in 2 hours if headache persists or recurs.    [provider]  SYNTHROID 100 MCG tablet Take 1 tablet (100 mcg total) by mouth daily before  breakfast. 09/28/17   Carlus Pavlov, MD  tiZANidine (ZANAFLEX) 2 MG tablet Take 1 tablet (2 mg total) by mouth every 8 (eight) hours as needed for muscle spasms. 12/06/17   Levert Feinstein, MD  Vitamin D, Ergocalciferol, (DRISDOL) 50000 units CAPS capsule Take 1 capsule (50,000 Units total) by mouth every 7 (seven) days. 06/02/17   Levert Feinstein, MD  zolpidem (AMBIEN) 10 MG tablet Take 0.5-1 tablets (5-10 mg total) by mouth at bedtime as needed. 11/01/17   Levert Feinstein, MD    Allergies:   Patient has no known allergies.   Social History   Socioeconomic History  . Marital status: Married    Spouse name: Not on file  . Number of children: 2  . Years of education: 16  . Highest education level: Bachelor's degree (e.g., BA, AB, BS)  Occupational History    Employer: RETIRED  Social Needs  . Financial resource strain: Not on file  . Food insecurity:    Worry: Not on file    Inability: Not on file  . Transportation needs:    Medical: Not on file    Non-medical: Not on file  Tobacco Use  . Smoking status: Never Smoker  . Smokeless tobacco: Never Used  Substance and Sexual Activity  . Alcohol use: No  . Drug use: No  . Sexual activity: Not on file  Lifestyle  . Physical activity:    Days per week: Not on file    Minutes per session: Not on file  . Stress: Not on file  Relationships  . Social connections:    Talks on phone: Not on file    Gets together: Not on file    Attends religious service: Not on file    Active member of club or organization: Not on file    Attends meetings of clubs or organizations: Not on file    Relationship status: Not on file  Other Topics Concern  . Not on file  Social History Narrative   Lives at home alone.   Right-handed.   2-3 cups caffeine per day.     Family History:  The patient's family history includes Cancer in her father, maternal aunt, and mother; Heart disease in her father and mother.   ROS:   Please see the history of present illness.     ROS All other systems reviewed and are negative.   PHYSICAL EXAM:   VS:  BP 104/68   Pulse 69   Ht 5\' 7"  (1.702 m)   Wt 202 lb 1.9 oz (91.7 kg)   SpO2 96%   BMI 31.66 kg/m    GEN: Well nourished, well developed, in no acute distress  HEENT: normal  Neck: no  JVD, carotid bruits, or masses Cardiac: RRR; no murmurs, rubs, or gallops,no edema  Respiratory:  clear to auscultation bilaterally, normal work of breathing GI: soft, nontender, nondistended, + BS MS: no deformity or atrophy  Skin: warm and dry, no rash Neuro:  Alert and Oriented x 3, Strength and sensation are intact Psych: euthymic mood, full affect  Wt Readings from Last 3 Encounters:  04/03/18 202 lb 1.9 oz (91.7 kg)  01/30/18 198 lb (89.8 kg)  01/23/18 201 lb (91.2 kg)      Studies/Labs Reviewed:   EKG:  EKG is ordered today.  The ekg ordered today demonstrates NSR ST/T wave changes in inferior leads, improved from last EKG  Recent Labs: 11/15/2017: TSH 0.83 02/03/2018: BUN 10; Creatinine, Ser 0.97; Potassium 4.1; Sodium 141   Lipid Panel    Component Value Date/Time   CHOL 151 10/03/2013   TRIG 100 10/03/2013   HDL 61 10/03/2013   LDLCALC 70 10/03/2013    Additional studies/ records that were reviewed today include:   Echocardiogram: 07/01/17 Study Conclusions  - Left ventricle: The cavity size was normal. Wall thickness was   normal. Systolic function was normal. The estimated ejection   fraction was in the range of 55% to 60%. Wall motion was normal;   there were no regional wall motion abnormalities. Features are   consistent with a pseudonormal left ventricular filling pattern,   with concomitant abnormal relaxation and increased filling   pressure (grade 2 diastolic dysfunction). - Aortic valve: There was no stenosis. There was mild   regurgitation. - Right ventricle: The cavity size was normal. Systolic function   was normal. - Tricuspid valve: Peak RV-RA gradient (S): 25 mm Hg. -  Pulmonary arteries: PA peak pressure: 28 mm Hg (S). - Inferior vena cava: The vessel was normal in size. The   respirophasic diameter changes were in the normal range (>= 50%),   consistent with normal central venous pressure.  Impressions:  - Normal LV size with EF 55-60%. Moderate diastolic dysfunction.   Normal RV size and systolic function. Mild aortic insufficiency.     ASSESSMENT & PLAN:    1. dysautonomia/orthostatic hypotension  - Symptoms improved on combination of Florinef and Midodrine. Reviewed with Dr. Elease HashimotoNahser increase midodrine to 5mg  TID as planned.   2. Hypokalemia - She was unable swallow Kdur 20meq and stooped 4 weeks ago. Check BMET today prior to initiation of supplement.   3. Abnormal EKG - ST segment improved from last EKG. No workup per Dr. Melburn PopperNasher due to lack of symptoms.   4. Prolonged QT - Reviewed with Pharmacy. Advised to follow up with PCP to change Wallbutrin XL and/or Zoloft.     Medication Adjustments/Labs and Tests Ordered: Current medicines are reviewed at length with the patient today.  Concerns regarding medicines are outlined above.  Medication changes, Labs and Tests ordered today are listed in the Patient Instructions below. Patient Instructions  Medication Instructions:  Please increase your Midodrine to 5 mg three times daily. Continue all other medications as listed.  If you need a refill on your cardiac medications before your next appointment, please call your pharmacy.   Lab work: Please have blood work today. (BMP)  If you have labs (blood work) drawn today and your tests are completely normal, you will receive your results only by: Marland Kitchen. MyChart Message (if you have MyChart) OR . A paper copy in the mail If you have any lab test that is abnormal or we need to  change your treatment, we will call you to review the results.  Follow-Up: Follow up in 3 months with Dr Elease Hashimoto.  Thank you for choosing [facility]!!        Lorelei Pont, [place]  04/03/2018 3:26 PM    [facility] [address] [place], [place], [place]  [address] Phone: [phone]; Fax: [phone]

## 2018-04-04 ENCOUNTER — Telehealth: Payer: Self-pay

## 2018-04-04 DIAGNOSIS — E876 Hypokalemia: Secondary | ICD-10-CM

## 2018-04-04 LAB — BASIC METABOLIC PANEL
BUN/Creatinine Ratio: 13 (ref 12–28)
BUN: 11 mg/dL (ref 8–27)
CALCIUM: 8.3 mg/dL — AB (ref 8.7–10.3)
CO2: 24 mmol/L (ref 20–29)
CREATININE: 0.88 mg/dL (ref 0.57–1.00)
Chloride: 103 mmol/L (ref 96–106)
GFR calc Af Amer: 73 mL/min/{1.73_m2} (ref >59)
GFR, EST NON AFRICAN AMERICAN: 64 mL/min/{1.73_m2} (ref >59)
Glucose: 75 mg/dL (ref 65–99)
Potassium: 3.4 mmol/L — ABNORMAL LOW (ref 3.5–5.2)
Sodium: 144 mmol/L (ref 134–144)

## 2018-04-04 NOTE — Telephone Encounter (Signed)
Spoke with the patient's daughter, she accepted the lab results and is going to increase potassium in diet to increase her value. She is schedule for a BMET on 12/13.

## 2018-04-04 NOTE — Telephone Encounter (Signed)
-----   Message from HookertonBhavinkumar Bhagat, [place]PA sent at 04/04/2018  8:18 AM EST ----- Potassium level is almost normal despite not taking supplement for past 3-4 weeks (based on yesterday's conversation). Advise to increase food high in potassium. Recheck BMET in 3 weeks. If still not improved, will consider supplementation.

## 2018-04-05 ENCOUNTER — Telehealth: Payer: Self-pay

## 2018-04-05 ENCOUNTER — Encounter: Payer: Self-pay | Admitting: *Deleted

## 2018-04-05 ENCOUNTER — Other Ambulatory Visit: Payer: Self-pay | Admitting: Neurology

## 2018-04-05 MED FILL — SYNTHROID 100 MCG TABLET: 100 | 45 days supply | Qty: 45 | Fill #4

## 2018-04-05 MED FILL — ZOLPIDEM TARTRATE 10 MG TAB: 10 | 30 days supply | Qty: 30 | Fill #4

## 2018-04-05 NOTE — Telephone Encounter (Signed)
Notes recorded by Sigurd Sosapp, Myka Lukins, RN on 04/05/2018 at 9:31 AM EST The patient has been notified of the result and verbalized understanding. Set up BMET 12/11. All questions (if any) were answered. Sigurd SosMichael Aleeyah Bensen, RN 04/05/2018 9:30 AM

## 2018-04-05 NOTE — Telephone Encounter (Signed)
-----   Message from Bhavinkumar Bhagat, PA sent at 04/04/2018  8:18 AM EST ----- Potassium level is almost normal despite not taking supplement for past 3-4 weeks (based on yesterday's conversation). Advise to increase food high in potassium. Recheck BMET in 3 weeks. If still not improved, will consider supplementation. 

## 2018-04-11 MED FILL — NORTRIPTYLINE HCL 10 MG CAP: 10 | 30 days supply | Qty: 60 | Fill #8

## 2018-04-11 MED FILL — SUMATRIPTAN SUCC 100 MG TAB: 100 | 30 days supply | Qty: 12 | Fill #4

## 2018-04-11 MED FILL — buPROPion HCL ER (XL) 150 M: 150 | 30 days supply | Qty: 30 | Fill #3

## 2018-04-26 ENCOUNTER — Other Ambulatory Visit: Payer: Medicare Other

## 2018-04-26 ENCOUNTER — Ambulatory Visit: Payer: Self-pay | Admitting: Neurology

## 2018-04-26 ENCOUNTER — Telehealth: Payer: Self-pay | Admitting: *Deleted

## 2018-04-26 NOTE — Telephone Encounter (Signed)
No showed Botox appointment. 

## 2018-04-26 NOTE — Telephone Encounter (Signed)
Patient called to apologize for missing her appt.  States she had her days mixed up.  Says this is not typical for her and she felt really bad about it happening.  She has been rescheduled for 05/04/18.

## 2018-04-26 NOTE — Telephone Encounter (Signed)
Patient got confused on days and missed her Botox appointment today. She would like to discuss and I saw no openings until February.

## 2018-04-28 ENCOUNTER — Other Ambulatory Visit: Payer: Medicare Other

## 2018-05-01 MED FILL — FLUDROCORTISONE 0.1 MG TAB: 0.1 | 30 days supply | Qty: 90 | Fill #4

## 2018-05-01 NOTE — Telephone Encounter (Signed)
Pt requesting a stating her botox appt was supposed to be scheduled for 1pm on the 19th stating she is unsure why it got r/s for a 9am. Please advise

## 2018-05-01 NOTE — Telephone Encounter (Signed)
Spoke to patient - states she confused her appt times and would like something later.  She has been moved to an open slot on 05/02/18 at 10:30am.

## 2018-05-02 ENCOUNTER — Ambulatory Visit (INDEPENDENT_AMBULATORY_CARE_PROVIDER_SITE_OTHER): Payer: Medicare Other | Admitting: Neurology

## 2018-05-02 ENCOUNTER — Encounter: Payer: Self-pay | Admitting: Neurology

## 2018-05-02 VITALS — BP 103/69 | HR 68 | Ht 67.0 in | Wt 202.0 lb

## 2018-05-02 DIAGNOSIS — IMO0002 Reserved for concepts with insufficient information to code with codable children: Secondary | ICD-10-CM

## 2018-05-02 DIAGNOSIS — G43709 Chronic migraine without aura, not intractable, without status migrainosus: Secondary | ICD-10-CM

## 2018-05-02 MED ORDER — ONABOTULINUMTOXINA 100 UNITS IJ SOLR
200.0000 [IU] | Freq: Once | INTRAMUSCULAR | Status: AC
  Administered 2018-05-02: 200 [IU] via INTRAMUSCULAR

## 2018-05-02 NOTE — Progress Notes (Signed)
**  Botox 100 units x 2 vials, NDC [id number], Lot [id number], Exp 09/2020, office supply.//mck,rn**

## 2018-05-02 NOTE — Progress Notes (Signed)
PATIENT: Mallory Durham DOB: 05/22/1940  Chief Complaint  Patient presents with  . Migraine    Botox 100 units x 2 vials - office supply     HISTORICAL  Mallory Durham is a 77 year old female, seen in refer by primary care PA Fortino Sic, for evaluation of migraine headaches, chronic neck pain, initial evaluation was on June 01, 2017.  Reviewed and summarized the referring note, she has past medical history of hypothyroidism, on supplement, hyperlipidemia, chronic neck pain, chronic renal insufficiency,  Since 2016, she noticed progressive worsening dizziness, to the point now, she sits down most of the time, is not able to exercise, in addition, she is under a lot of stress, tried to see her husband at the nursing home on a daily basis.  She drinks 2 of 12 ounce of water bottle every day, but she complains lightheadedness shortly after standing up, mild improvement walking short distance, but with prolonged walking and standing, she would develop this washout sensation, lightheadedness, as if she is going to faint, she has to sit down to improve her symptoms.  During today's examination, she was found to have significant orthostatic blood pressure change in the setting of already low blood pressure, lying down blood pressure 97/70, heart rate of 83, standing up systolic blood pressure was 50 or less, required multiple measurement to register, she does become symptomatic, especially after standing up for more than 3 minutes.  Her symptoms gradually improved after lying flat.  Previously she was given prescription of midodrine 2.5 mg to get in the early morning, and before bed, there was no significant improvement noted.  She also noted gradual onset memory loss, difficulty focusing, she has history of migraine headache, increased migraine over the past few months, she could not tolerate Imitrex 100 mg as needed due to heart palpitation, she usually take half of 100 mg tablets,  if needed, she may repeat in couple hours, which take away the headache in few hours,  She is quite disabled by her dizziness, has to stay seated most of the time, she denies bilateral lower extremity swelling, no significant sensory loss.  UPDATE July 18 2017: MRI of the brain in February 2019, generalized atrophy, especially at the right perisylvian fissure area, mild supratentorium small vessel disease, there is no acute abnormality.  Laboratory evaluation showed A1c of 5.2, normal negative ANA, copper, C-reactive protein, ESR, RPR, B12, CMP, lipid profile, BNP was elevated 246   Significant low vitamin D 5.2,  Echocardiogram showed ejection fraction of 55-60%, wall motion was normal, aortic valve showed no stenosis, ventricular size was normal, systolic function was normal  She is taking gabapentin 666m bid, midodrine 10 mg 3 times a day, last dose was before she goes to bed, she also complains of chronic insomnia, taking Ambien 10 mg at nighttime  She still has depression, anxiety, she has frequent headaches, once or twice each day, she has been 50 mg as needed, 50 mg or Maxalt as needed for headaches, which helps her headache some  UPDATE November 01 2017: She is accompanied by her daughter Mallory Durham today's clinical visit, functional status has much declined, despite midodrine 10 mg 3 times a day, and the Mestinon 60 mg 3 times a day, she complains of lightheadedness, dizziness when getting up just a few minutes, today she had significant orthostatic pressure dropped [id number] systolic 60 after standing up, difficult to register diastolic pressure.   She complains lightheadedness fainting sensation after lying down  for a few minutes,blood pressure points back to 120/80, her symptoms has much improved  She also complains of frequent migraine headaches, sometimes woke up with migraine headaches, taking Imitrex as needed  Chronic insomnia, Ambien 5 to 10 mg as needed  UPDATE Sept 9  2019: She is now taking fludrocortisone 0.1 mg 3 times a day, which has really helped her symptoms, along with Mestinon 60 mg daily, she is scheduled to see Mallory Durham [facility] on February 07, 2018,  She complains of chronic migraine headaches for many years,3-4 times each week, right lateralized severe pounding headaches, with associated light noise sensitivity, Imitrex 100 mg as needed was helpful, still her headache last about 6 hours,  UPDATE May 02 2018: She is now taking fludrocortisone 0.3 mg 3 times a day, Mestinon 60 mg 3 times a day, midodrine was increased from 2.5 to 5 mg 3 times a day, orthostatic symptoms has much improved,  She has almost daily headache, more on right temporal region, imitrex 174m 1/2 tab daily,   Reported 40% improvement with Botox injection, she has less headache, less severe, but benefit short lasting,   REVIEW OF SYSTEMS: Full 14 system review of systems performed and notable only for above  All rest review of system were negative  ALLERGIES: No Known Allergies   HOME MEDICATIONS: Current Outpatient Medications  Medication Sig Dispense Refill  . albuterol (PROVENTIL HFA;VENTOLIN HFA) 108 (90 Base) MCG/ACT inhaler Inhale 2 puffs into the lungs every 6 (six) hours as needed for wheezing or shortness of breath. 1 Inhaler 0  . botulinum toxin Type A (BOTOX) 100 units SOLR injection Inject IM every 3 months by MD in the office 2 vial 3  . buPROPion (WELLBUTRIN XL) 150 MG 24 hr tablet Take 1 tablet (150 mg total) by mouth daily. 30 tablet 0  . fludrocortisone (FLORINEF) 0.1 MG tablet Take 1 tablet (0.1 mg total) by mouth 3 (three) times daily. 90 tablet 6  . fluticasone (FLONASE) 50 MCG/ACT nasal spray Place into both nostrils daily as needed for allergies or rhinitis.    . fluticasone (FLOVENT HFA) 110 MCG/ACT inhaler Inhale 2 puffs into the lungs 2 (two) times daily as needed ([facility].    .Marland Kitchengabapentin (NEURONTIN) 600 MG tablet Take 600 mg by  mouth at bedtime.     . midodrine (PROAMATINE) 5 MG tablet Take 1 tablet (5 mg total) by mouth 3 (three) times daily with meals. 90 tablet 6  . Multiple Vitamins-Minerals (PRESERVISION AREDS 2) CAPS Take 2 capsules by mouth.    . nortriptyline (PAMELOR) 10 MG capsule Take 2 capsules (20 mg total) by mouth at bedtime. 60 capsule 11  . phenazopyridine (PYRIDIUM) 100 MG tablet Take 1 tablet (100 mg total) by mouth 3 (three) times daily as needed for pain. 10 tablet 0  . potassium chloride SA (K-DUR,KLOR-CON) 20 MEQ tablet Take 1 pill 3 times per day the first 2 days then 1 pill twice daily everyday after 180 tablet 3  . propranolol ER (INDERAL LA) 60 MG 24 hr capsule Take 1 capsule (60 mg total) by mouth at bedtime. 30 capsule 11  . pyridostigmine (MESTINON) 60 MG tablet Take 1 tablet (60 mg total) by mouth 3 (three) times daily. 90 tablet 11  . sertraline (ZOLOFT) 100 MG tablet Take 100 mg by mouth daily.    . simvastatin (ZOCOR) 20 MG tablet Take 1 tablet (20 mg total) by mouth every evening. 90 tablet 1  . SUMAtriptan (IMITREX) 100  MG tablet Take 100 mg by mouth every 2 (two) hours as needed for migraine. May repeat in 2 hours if headache persists or recurs.    Marland Kitchen SYNTHROID 100 MCG tablet Take 1 tablet (100 mcg total) by mouth daily before breakfast. 45 tablet 5  . tiZANidine (ZANAFLEX) 2 MG tablet Take 1 tablet (2 mg total) by mouth every 8 (eight) hours as needed for muscle spasms. 30 tablet 1  . zolpidem (AMBIEN) 10 MG tablet Take 0.5-1 tablets (5-10 mg total) by mouth at bedtime as needed. 30 tablet 5   No current facility-administered medications for this visit.     PAST MEDICAL HISTORY: Past Medical History:  Diagnosis Date  . Anxiety   . Confusion, hx of, without neuro findings   . Depression   . Hx of vertigo   . Hyperlipidemia   . Hypothyroid   . Left shoulder pain   . Migraines   . Neck pain   . Orthostatic hypotension   . Renal insufficiency     PAST SURGICAL  HISTORY: Past Surgical History:  Procedure Laterality Date  . ABDOMINAL HYSTERECTOMY    . ABDOMINAL HYSTERECTOMY    . BREAST LUMPECTOMY    . CHOLECYSTECTOMY    . JOINT REPLACEMENT    . THYROID SURGERY    . TONSILLECTOMY      FAMILY HISTORY: Family History  Problem Relation Age of Onset  . Heart disease Mother        Angina  . Cancer Mother   . Cancer Father   . Heart disease Father   . Cancer Maternal Aunt     SOCIAL HISTORY:  Social History   Socioeconomic History  . Marital status: Married    Spouse name: Not on file  . Number of children: 2  . Years of education: 16  . Highest education level: Bachelor's degree (e.g., BA, AB, BS)  Occupational History    Employer: RETIRED  Social Needs  . Financial resource strain: Not on file  . Food insecurity:    Worry: Not on file    Inability: Not on file  . Transportation needs:    Medical: Not on file    Non-medical: Not on file  Tobacco Use  . Smoking status: Never Smoker  . Smokeless tobacco: Never Used  Substance and Sexual Activity  . Alcohol use: No  . Drug use: No  . Sexual activity: Not on file  Lifestyle  . Physical activity:    Days per week: Not on file    Minutes per session: Not on file  . Stress: Not on file  Relationships  . Social connections:    Talks on phone: Not on file    Gets together: Not on file    Attends religious service: Not on file    Active member of club or organization: Not on file    Attends meetings of clubs or organizations: Not on file    Relationship status: Not on file  . Intimate partner violence:    Fear of current or ex partner: Not on file    Emotionally abused: Not on file    Physically abused: Not on file    Forced sexual activity: Not on file  Other Topics Concern  . Not on file  Social History Narrative   Lives at home alone.   Right-handed.   2-3 cups caffeine per day.     PHYSICAL EXAM Blood pressure lying down 115/72 heart rate of 60, standing up  was  120/80  Body mass index is 31.64 kg/m.  PHYSICAL EXAMNIATION:  Gen: NAD, conversant, well nourised, obese, well groomed                     Cardiovascular: Regular rate rhythm, no peripheral edema, warm, nontender. Eyes: Conjunctivae clear without exudates or hemorrhage Neck: Supple, no carotid bruits. Pulmonary: Clear to auscultation bilaterally   NEUROLOGICAL EXAM:  MENTAL STATUS: Speech:    Speech is normal; fluent and spontaneous with normal comprehension.  Cognition:     Orientation to time, place and person     Normal recent and remote memory     Normal Attention span and concentration     Normal Language, naming, repeating,spontaneous speech     Fund of knowledge   CRANIAL NERVES: CN II: Visual fields are full to confrontation. Pupils are round equal and briskly reactive to light. CN III, IV, VI: extraocular movement are normal. No ptosis. CN V: Facial sensation is intact to pinprick in all 3 divisions bilaterally. Corneal responses are intact.  CN VII: Face is symmetric with normal eye closure and smile. CN VIII: Hearing is normal to rubbing fingers CN IX, X: Palate elevates symmetrically. Phonation is normal. CN XI: Head turning and shoulder shrug are intact CN XII: Tongue is midline with normal movements and no atrophy.  MOTOR: There is no pronator drift of out-stretched arms. Muscle bulk and tone are normal. Muscle strength is normal.  REFLEXES: Reflexes are 2+ and symmetric at the biceps, triceps, knees, and ankles. Plantar responses are flexor.  SENSORY: Mildly less dependent sensory changes to pinprick.  COORDINATION: Rapid alternating movements and fine finger movements are intact. There is no dysmetria on finger-to-nose and heel-knee-shin.    GAIT/STANCE: She can get up without pushing on chair arm, steady, Romberg is absent.   DIAGNOSTIC DATA (LABS, IMAGING, TESTING) - I reviewed patient records, labs, notes, testing and imaging myself where  available.   ASSESSMENT AND PLAN  Jazae Gandolfi is a 77 y.o. female   Autonomic failure  With significant orthostatic blood pressure changes,  No treatable etiology found, most consistent with central nervous system degenerative disorder  She also reported pseudobulbar phenomena  Patient is severely symptomatic from her profound autonomic failure, significant orthostatic blood pressure changes, essentially disabled from her symptoms,   Doing better with Fludrocortisone 0.1 mg tid,  midodrine 15m tid,  Mestinon 60 mg 3 times a day  Chronic migraine headaches  Imitrex 50 mg as needed  She has daily headache, using imitrex daily.   Botox injection for chronic migraine prevention, injection was performed according to [place] protocol,  5 units of Botox was injected into each side, for 31 injection sites, total of 155 units  Bilateral frontalis 4 injection sites Bilateral corrugate 2 injection sites Procerus 1 injection sites. Bilateral temporalis 8 injection sites Bilateral occipitalis 6 injection sites Bilateral cervical paraspinals 4 injection sites Bilateral upper trapezius 6 injection sites  Extra 45 unites were injected into bilateral upper cervical paraspinal muscles and right temporoparietal region  YMarcial Pacas M.D. Ph.D.  [facility] [address] [place] [address]Ph: [phone]: [phone] CC: WFortino Sic PUtah

## 2018-05-02 NOTE — Addendum Note (Signed)
Addended by: Levert FeinsteinYAN, Dorothy Polhemus on: 05/02/2018 10:27 AM   Modules accepted: Orders

## 2018-05-03 ENCOUNTER — Ambulatory Visit: Payer: Self-pay | Admitting: Neurology

## 2018-05-03 MED FILL — GABAPENTIN 600 MG TABS: 600 | 30 days supply | Qty: 30 | Fill #0

## 2018-05-03 MED FILL — SIMVASTATIN 20 MG TABLET: 20 | 90 days supply | Qty: 90 | Fill #0

## 2018-05-04 ENCOUNTER — Ambulatory Visit: Payer: Self-pay | Admitting: Neurology

## 2018-05-05 MED FILL — PROPRANOLOL ER 60 MG CAP: 60 | 30 days supply | Qty: 30 | Fill #5

## 2018-05-05 MED FILL — SUMATRIPTAN SUCC 100 MG TAB: 100 | 30 days supply | Qty: 12 | Fill #5

## 2018-05-05 MED FILL — PYRIDOSTIGMINE BR 60 MG TAB: 60 | 30 days supply | Qty: 90 | Fill #7

## 2018-05-19 ENCOUNTER — Other Ambulatory Visit: Payer: Self-pay | Admitting: *Deleted

## 2018-05-19 MED ORDER — ZOLPIDEM TARTRATE 10 MG PO TABS: 5.0000 mg | ORAL_TABLET | Freq: Every evening | ORAL | 5 refills | Status: DC | PRN

## 2018-05-19 MED FILL — buPROPion HCL ER (XL) 150 M: 150 | 30 days supply | Qty: 30 | Fill #4

## 2018-05-19 MED FILL — ZOLPIDEM TARTRATE 10 MG TAB: 10 | 30 days supply | Qty: 30 | Fill #0

## 2018-05-19 MED FILL — NORTRIPTYLINE HCL 10 MG CAP: 10 | 30 days supply | Qty: 60 | Fill #9

## 2018-05-19 MED FILL — MIDODRINE HCL 5 MG TABLET: 5 | 30 days supply | Qty: 90 | Fill #1

## 2018-05-19 MED FILL — ONDANSETRON HCL 4 MG TABLET: 4 | 10 days supply | Qty: 30 | Fill #1

## 2018-05-19 NOTE — Progress Notes (Signed)
Prescription for Ambien was sent in. 

## 2018-06-01 MED FILL — GABAPENTIN 600 MG TABS: 600 | 30 days supply | Qty: 30 | Fill #1

## 2018-06-01 MED FILL — PROPRANOLOL ER 60 MG CAP: 60 | 30 days supply | Qty: 30 | Fill #6

## 2018-06-01 MED FILL — SYNTHROID 100 MCG TABLET: 100 | 45 days supply | Qty: 45 | Fill #5

## 2018-06-09 MED FILL — SUMATRIPTAN SUCC 100 MG TAB: 100 | 30 days supply | Qty: 12 | Fill #6

## 2018-06-20 ENCOUNTER — Other Ambulatory Visit: Payer: Self-pay | Admitting: Neurology

## 2018-06-20 MED FILL — PYRIDOSTIGMINE BR 60 MG TAB: 60 | 30 days supply | Qty: 90 | Fill #0

## 2018-06-20 MED FILL — SERTRALINE HCL 100 MG TAB: 100 | 30 days supply | Qty: 45 | Fill #1

## 2018-06-20 MED FILL — ZOLPIDEM TARTRATE 10 MG TAB: 10 | 30 days supply | Qty: 30 | Fill #1

## 2018-06-20 MED FILL — FLUDROCORTISONE 0.1 MG TAB: 0.1 | 30 days supply | Qty: 90 | Fill #5

## 2018-06-28 DIAGNOSIS — H524 Presbyopia: Secondary | ICD-10-CM | POA: Diagnosis not present

## 2018-06-28 DIAGNOSIS — H353131 Nonexudative age-related macular degeneration, bilateral, early dry stage: Secondary | ICD-10-CM | POA: Diagnosis not present

## 2018-06-28 DIAGNOSIS — H5213 Myopia, bilateral: Secondary | ICD-10-CM | POA: Diagnosis not present

## 2018-06-28 DIAGNOSIS — H01001 Unspecified blepharitis right upper eyelid: Secondary | ICD-10-CM | POA: Diagnosis not present

## 2018-06-28 DIAGNOSIS — H40013 Open angle with borderline findings, low risk, bilateral: Secondary | ICD-10-CM | POA: Diagnosis not present

## 2018-06-28 DIAGNOSIS — H04123 Dry eye syndrome of bilateral lacrimal glands: Secondary | ICD-10-CM | POA: Diagnosis not present

## 2018-06-28 DIAGNOSIS — Z23 Encounter for immunization: Secondary | ICD-10-CM | POA: Diagnosis not present

## 2018-06-30 MED FILL — NORTRIPTYLINE HCL 10 MG CAP: 10 | 30 days supply | Qty: 60 | Fill #10

## 2018-06-30 MED FILL — MIDODRINE HCL 5 MG TABLET: 5 | 30 days supply | Qty: 90 | Fill #2

## 2018-06-30 MED FILL — buPROPion HCL ER (XL) 150 M: 150 | 30 days supply | Qty: 30 | Fill #5

## 2018-07-04 ENCOUNTER — Ambulatory Visit: Payer: Medicare Other | Admitting: Cardiovascular Disease

## 2018-07-17 MED FILL — GABAPENTIN 600 MG TABS: 600 | 30 days supply | Qty: 30 | Fill #0

## 2018-07-17 MED FILL — PROPRANOLOL ER 60 MG CAP: 60 | 30 days supply | Qty: 30 | Fill #7

## 2018-07-17 MED FILL — ZOLPIDEM TARTRATE 10 MG TAB: 10 | 30 days supply | Qty: 30 | Fill #2

## 2018-07-17 MED FILL — SUMATRIPTAN SUCC 100 MG TAB: 100 | 30 days supply | Qty: 12 | Fill #7

## 2018-07-21 ENCOUNTER — Ambulatory Visit (INDEPENDENT_AMBULATORY_CARE_PROVIDER_SITE_OTHER): Payer: Medicare Other | Admitting: Physician Assistant

## 2018-07-21 ENCOUNTER — Other Ambulatory Visit: Payer: Self-pay | Admitting: Internal Medicine

## 2018-07-21 ENCOUNTER — Encounter: Payer: Self-pay | Admitting: Physician Assistant

## 2018-07-21 VITALS — BP 110/70 | HR 64 | Ht 67.0 in | Wt 202.1 lb

## 2018-07-21 DIAGNOSIS — I951 Orthostatic hypotension: Secondary | ICD-10-CM | POA: Diagnosis not present

## 2018-07-21 DIAGNOSIS — E876 Hypokalemia: Secondary | ICD-10-CM | POA: Diagnosis not present

## 2018-07-21 LAB — BASIC METABOLIC PANEL
BUN / CREAT RATIO: 9 — AB (ref 12–28)
BUN: 12 mg/dL (ref 8–27)
CHLORIDE: 98 mmol/L (ref 96–106)
CO2: 24 mmol/L (ref 20–29)
Calcium: 8.9 mg/dL (ref 8.7–10.3)
Creatinine, Ser: 1.3 mg/dL — ABNORMAL HIGH (ref 0.57–1.00)
GFR calc Af Amer: 46 mL/min/{1.73_m2} — ABNORMAL LOW (ref >59)
GFR, EST NON AFRICAN AMERICAN: 40 mL/min/{1.73_m2} — AB (ref >59)
Glucose: 57 mg/dL — ABNORMAL LOW (ref 65–99)
Potassium: 3.1 mmol/L — ABNORMAL LOW (ref 3.5–5.2)
SODIUM: 140 mmol/L (ref 134–144)

## 2018-07-21 MED ORDER — MIDODRINE HCL 10 MG PO TABS: 10.0000 mg | ORAL_TABLET | Freq: Three times a day (TID) | ORAL | 3 refills | Status: DC

## 2018-07-21 MED ORDER — PHENAZOPYRIDINE HCL 100 MG PO TABS: 100.0000 mg | ORAL_TABLET | Freq: Three times a day (TID) | ORAL | 0 refills | Status: DC | PRN

## 2018-07-21 MED FILL — SYNTHROID 100 MCG TABLET: 100 | 45 days supply | Qty: 45 | Fill #0

## 2018-07-21 MED FILL — MIDODRINE HCL 10 MG TABLET: 10 | 90 days supply | Qty: 270 | Fill #0

## 2018-07-21 NOTE — Progress Notes (Signed)
Cardiology Office Note:    Date:  07/21/2018   ID:  Mallory Durham, DOB 09/01/1940, MRN [id number]  PCP:  Clide Dales, PA  Cardiologist:  Kristeen Miss, MD   Electrophysiologist:  None   Referring MD: Clide Dales, *   Chief Complaint  Patient presents with  . Follow-up    orthostatic hypotension     History of Present Illness:    Mallory Durham is a 78 y.o. 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  She established with Dr. Elease Hashimoto in September 2019.  She was feeling better on Florinef and Midodrine was added to her regimen.   Mallory Durham returns for follow up.  She is here with her daughter.  She saw Manson Passey, PA-C in 03/2018 and he increased her Midodrine.  She did not notice much difference. She has chronic nausea that she thinks is due to Mestinon.  She has not had chest pain, syncope, paroxysmal nocturnal dyspnea.  She does get short of breath with some activities.  This is chronic without change.  Her orthostatic intolerance is much improved.  She still has symptoms after several minutes of standing.    Prior CV studies:   The following studies were reviewed today:  Echocardiogram 07/01/2017 EF 55-60, normal wall motion, grade 2 diastolic dysfunction, mild AI, normal RV SF, PASP 28  Past Medical History:  Diagnosis Date  . Anxiety   . Confusion, hx of, without neuro findings   . Depression   . Hx of vertigo   . Hyperlipidemia   . Hypothyroid   . Left shoulder pain   . Migraines   . Neck pain   . Orthostatic hypotension   . Renal insufficiency    Surgical Hx: The patient  has a past surgical history that includes Joint replacement; Abdominal hysterectomy; Thyroid surgery; Cholecystectomy; Tonsillectomy; Abdominal hysterectomy; and Breast lumpectomy.   Current Medications: Current Meds  Medication Sig  . albuterol (PROVENTIL HFA;VENTOLIN HFA) 108 (90 Base) MCG/ACT inhaler Inhale  2 puffs into the lungs every 6 (six) hours as needed for wheezing or shortness of breath.  Marland Kitchen aspirin-acetaminophen-caffeine (EXCEDRIN MIGRAINE) 250-250-65 MG tablet Take by mouth every 6 (six) hours as needed for headache.  . Bacillus Coagulans-Inulin (ALIGN PREBIOTIC-PROBIOTIC PO) Take 1 tablet by mouth daily.  . botulinum toxin Type A (BOTOX) 100 units SOLR injection Inject IM every 3 months by MD in the office  . buPROPion (WELLBUTRIN XL) 150 MG 24 hr tablet Take 1 tablet (150 mg total) by mouth daily.  . fludrocortisone (FLORINEF) 0.1 MG tablet Take 1 tablet (0.1 mg total) by mouth 3 (three) times daily.  . fluticasone (FLONASE) 50 MCG/ACT nasal spray Place into both nostrils daily as needed for allergies or rhinitis.  . fluticasone (FLOVENT HFA) 110 MCG/ACT inhaler Inhale 2 puffs into the lungs 2 (two) times daily as needed [facility]).  Marland Kitchen gabapentin (NEURONTIN) 600 MG tablet Take 600 mg by mouth at bedtime.   . Multiple Vitamins-Minerals (PRESERVISION AREDS 2) CAPS Take 2 capsules by mouth.  . nortriptyline (PAMELOR) 10 MG capsule Take 2 capsules (20 mg total) by mouth at bedtime.  . phenazopyridine (PYRIDIUM) 100 MG tablet Take 1 tablet (100 mg total) by mouth 3 (three) times daily as needed for pain.  . potassium chloride SA (K-DUR,KLOR-CON) 20 MEQ tablet Take 1 pill 3 times per day the first 2 days then 1 pill twice daily everyday after  . propranolol ER (INDERAL LA) 60 MG 24 hr  capsule Take 1 capsule (60 mg total) by mouth at bedtime.  . sertraline (ZOLOFT) 100 MG tablet Take 100 mg by mouth daily.  . simvastatin (ZOCOR) 20 MG tablet Take 1 tablet (20 mg total) by mouth every evening.  . SUMAtriptan (IMITREX) 100 MG tablet Take 100 mg by mouth every 2 (two) hours as needed for migraine. May repeat in 2 hours if headache persists or recurs.  Marland Kitchen SYNTHROID 100 MCG tablet Take 1 tablet (100 mcg total) by mouth daily before breakfast.  . tiZANidine (ZANAFLEX) 2 MG tablet Take 1 tablet (2 mg  total) by mouth every 8 (eight) hours as needed for muscle spasms.  Marland Kitchen zolpidem (AMBIEN) 10 MG tablet Take 0.5-1 tablets (5-10 mg total) by mouth at bedtime as needed.  . [DISCONTINUED] midodrine (PROAMATINE) 5 MG tablet Take 1 tablet (5 mg total) by mouth 3 (three) times daily with meals.  . [DISCONTINUED] OVER THE COUNTER MEDICATION Patient take One tablet by mouth  daily (Align Probiotic)  . [DISCONTINUED] phenazopyridine (PYRIDIUM) 100 MG tablet Take 1 tablet (100 mg total) by mouth 3 (three) times daily as needed for pain.  . [DISCONTINUED] pyridostigmine (MESTINON) 60 MG tablet TAKE 1 TABLET (60 MG TOTAL) BY MOUTH 3 (THREE) TIMES DAILY.     Allergies:   Patient has no known allergies.   Social History   Tobacco Use  . Smoking status: Never Smoker  . Smokeless tobacco: Never Used  Substance Use Topics  . Alcohol use: No  . Drug use: No     Family Hx: The patient's family history includes Cancer in her father, maternal aunt, and mother; Heart disease in her father and mother.  ROS:   Please see the history of present illness.    Review of Systems  Constitution: Positive for malaise/fatigue.  Cardiovascular: Positive for dyspnea on exertion.  Gastrointestinal: Positive for nausea.  Neurological: Positive for headaches and loss of balance.  Psychiatric/Behavioral: Positive for depression. The patient is nervous/anxious.    All other systems reviewed and are negative.   EKGs/Labs/Other Test Reviewed:    EKG:  EKG is not ordered today.   Recent Labs: 11/15/2017: TSH 0.83 04/03/2018: BUN 11; Creatinine, Ser 0.88; Potassium 3.4; Sodium 144   Recent Lipid Panel Lab Results  Component Value Date/Time   CHOL 151 10/03/2013   TRIG 100 10/03/2013   HDL 61 10/03/2013   LDLCALC 70 10/03/2013    Physical Exam:    VS:  BP 110/70   Pulse 64   Ht 5\' 7"  (1.702 m)   Wt 202 lb 1.9 oz (91.7 kg)   SpO2 97%   BMI 31.66 kg/m     Wt Readings from Last 3 Encounters:  07/21/18 202  lb 1.9 oz (91.7 kg)  05/02/18 202 lb (91.6 kg)  04/03/18 202 lb 1.9 oz (91.7 kg)     Physical Exam  Constitutional: She is oriented to person, place, and time. She appears well-developed and well-nourished. No distress.  HENT:  Head: Normocephalic and atraumatic.  Neck: No thyromegaly present.  Cardiovascular: Normal rate and regular rhythm.  No murmur heard. Pulmonary/Chest: Effort normal. She has no rales.  Abdominal: Soft.  Musculoskeletal:        General: No edema.  Lymphadenopathy:    She has no cervical adenopathy.  Neurological: She is alert and oriented to person, place, and time.  Skin: Skin is warm and dry.    ASSESSMENT & PLAN:    Orthostatic hypotension Symptomatically improved.  She still has  symptoms with prolonged standing.  She would like to try to come off of the Pyridostigmine due to her nausea.    -DC Pyridostigmine  -Increase Midodrine to 10 mg three times a day   -Continue Florinef  -FU with Dr. Terrace Arabia with Neurology as planned in 1 month  -FU with Dr. Elease Hashimoto in 3 mos.  Hypokalemia   - Plan: Basic metabolic panel     Dispo:  Return in about 3 months (around 10/21/2018) for Routine Follow Up, w/ Dr. Elease Hashimoto.   Medication Adjustments/Labs and Tests Ordered: Current medicines are reviewed at length with the patient today.  Concerns regarding medicines are outlined above.  Tests Ordered: Orders Placed This Encounter  Procedures  . Basic metabolic panel   Medication Changes: Meds ordered this encounter  Medications  . midodrine (PROAMATINE) 10 MG tablet    Sig: Take 1 tablet (10 mg total) by mouth 3 (three) times daily.    Dispense:  270 tablet    Refill:  3  . phenazopyridine (PYRIDIUM) 100 MG tablet    Sig: Take 1 tablet (100 mg total) by mouth 3 (three) times daily as needed for pain.    Dispense:  10 tablet    Refill:  0    Signed, Tereso Newcomer, PA-C  07/21/2018 1:36 PM    [facility] [address] [place], [place], [place]   [address] Phone: [phone]; Fax: [phone]

## 2018-07-21 NOTE — Patient Instructions (Addendum)
Medication Instructions:  1. STOP PYRIDOSTIGMINE  2. INCREASE MIDODRINE TO 10 MG THREE TIMES PER DAY. If you need a refill on your cardiac medications before your next appointment, please call your pharmacy.   Lab work: TODAY: BMET  If you have labs (blood work) drawn today and your tests are completely normal, you will receive your results only by: Marland Kitchen MyChart Message (if you have MyChart) OR . A paper copy in the mail If you have any lab test that is abnormal or we need to change your treatment, we will call you to review the results.  Testing/Procedures: NONE  Follow-Up: At [facility], you and your health needs are our priority.  As part of our continuing mission to provide you with exceptional heart care, we have created designated Provider Care Teams.  These Care Teams include your primary Cardiologist (physician) and Advanced Practice Providers (APPs -  Physician Assistants and Nurse Practitioners) who all work together to provide you with the care you need, when you need it. You will need a follow up appointment in:  3 months.   You may see DR. NAHSER or one of the following Advanced Practice Providers on your designated Care Team: Tereso Newcomer, PA-C [place], [place] . Berton Bon, NP  Any Other Special Instructions Will Be Listed Below (If Applicable).

## 2018-07-24 ENCOUNTER — Telehealth: Payer: Self-pay

## 2018-07-24 DIAGNOSIS — E876 Hypokalemia: Secondary | ICD-10-CM

## 2018-07-24 MED ORDER — POTASSIUM CHLORIDE ER 10 MEQ PO TBCR: 20.0000 meq | EXTENDED_RELEASE_TABLET | Freq: Two times a day (BID) | ORAL | 0 refills | Status: DC

## 2018-07-24 MED FILL — POTASSIUM CL ER 10 MEQ TAB: 10 | 30 days supply | Qty: 120 | Fill #0

## 2018-07-24 NOTE — Telephone Encounter (Signed)
Mallory Durham, Scott T, PA-C  P Cv Div Ch St Triage        Creatinine elevated. K+ low. Glucose low.   It is not clear if she is taking K+ or not. Med list has K+ 20 mEq twice daily. Notes indicate she was not taking it.  Recommendations:  - Push fluids over the weekend.  - If nausea worsens, go to urgent care or ED  - If taking K+ 20 mEq twice daily >> increase to 40 mEq twice daily  - If not taking K+ >> start K+ 20 mEq twice daily  - BMET Mon or Tues next week.  Tereso Newcomer, PA-C   07/21/2018 5:41 PM

## 2018-07-24 NOTE — Addendum Note (Signed)
Addended by: Michaelle Copas on: 07/24/2018 08:07 AM   Modules accepted: Orders

## 2018-07-24 NOTE — Telephone Encounter (Signed)
Called patient and made her aware of lab results. Patient states that she has not been taking her potassium supplement. She states that she cannot swallow the pill because it is too large. Instructed the patient to increase fluid intake and to eat foods that are high in potassium. Called and spoke to patient's pharmacy and liquid suspension and the packets for potassium would be $200 for a 30 day supply. Sent in a Rx for potassium 10 mEq tablets to take 2 tablets BID. Patient will [facility].

## 2018-07-26 ENCOUNTER — Ambulatory Visit: Payer: Self-pay | Admitting: Neurology

## 2018-07-31 NOTE — Telephone Encounter (Signed)
SPOKE WITH PT AND DUE TO COVID-19 SHE PREFERS TO STAY IN HOME UNTIL THINGS GET BETTER.

## 2018-07-31 NOTE — Telephone Encounter (Signed)
Patient was to have a repeat BMET last week.  Can we check on this and request the results? Tereso Newcomer, PA-C    07/31/2018 3:43 PM

## 2018-08-01 ENCOUNTER — Telehealth: Payer: Self-pay | Admitting: Neurology

## 2018-08-01 NOTE — Telephone Encounter (Signed)
I have called patient, she is overall doing well, she desires to cancel her appointment on August 02 2018  She will call us back in few weeks to reschedule

## 2018-08-02 ENCOUNTER — Ambulatory Visit: Payer: Self-pay | Admitting: Neurology

## 2018-08-04 NOTE — Telephone Encounter (Signed)
Reason for repeat was low potassium and worsening kidney function on last lab.    Can we see if she got her labs drawn at [facility]?  Notes indicated she planned to go there last week to have it drawn.  If she did, please request labs from [facility].   If not reschedule lab visit for BMET 2 weeks from now.  If pandemic situation worse at that time, we can cancel. If she has nausea, vomiting, syncope, she should call immediately. Tereso Newcomer, PA-C    08/04/2018 8:11 AM

## 2018-08-04 NOTE — Telephone Encounter (Signed)
SPOKE WITH PT AND PT  AWARE LABS  ARE NEEDED BUT DUE TO COVID-19 DOES NOT WANT TO DO ANY LAB RIGHT NOW  TO HAVE TO GO IN AREAS WITH OTHERS.  PT WOULD LIKE TO BE CONTACTED BACK IN A COUPLE OF WEEKS TO SEE IF SHE IS READY TO GET LABS DONE.

## 2018-08-15 MED FILL — SERTRALINE HCL 100 MG TAB: 100 | 30 days supply | Qty: 45 | Fill #2

## 2018-08-15 MED FILL — FLUDROCORTISONE 0.1 MG TAB: 0.1 | 30 days supply | Qty: 90 | Fill #6

## 2018-08-21 ENCOUNTER — Other Ambulatory Visit: Payer: Self-pay | Admitting: Neurology

## 2018-08-21 MED FILL — ZOLPIDEM TARTRATE 10 MG TAB: 10 | 30 days supply | Qty: 30 | Fill #3

## 2018-08-21 MED FILL — SUMATRIPTAN SUCC 100 MG TAB: 100 | 30 days supply | Qty: 12 | Fill #0

## 2018-08-21 MED FILL — PROPRANOLOL ER 60 MG CAP: 60 | 30 days supply | Qty: 30 | Fill #8

## 2018-08-29 ENCOUNTER — Other Ambulatory Visit: Payer: Self-pay | Admitting: Neurology

## 2018-08-29 MED FILL — NORTRIPTYLINE HCL 10 MG CAP: 10 | 30 days supply | Qty: 60 | Fill #0

## 2018-08-29 MED FILL — buPROPion HCL ER (XL) 150 M: 150 | 30 days supply | Qty: 30 | Fill #0

## 2018-09-04 ENCOUNTER — Telehealth: Payer: Self-pay | Admitting: *Deleted

## 2018-09-04 DIAGNOSIS — E876 Hypokalemia: Secondary | ICD-10-CM

## 2018-09-04 MED FILL — SYNTHROID 100 MCG TABLET: 100 | 45 days supply | Qty: 45 | Fill #1

## 2018-09-04 NOTE — Telephone Encounter (Signed)
SPOKE  WITH PT FOLLOWING BACK UP WITH GETTING LABS DRAWN   PT AGREED TO COME INTO OFFICE GET LABS 4-21

## 2018-09-04 NOTE — Telephone Encounter (Signed)
Please call patient and see if we can arrange a repeat BMET now for her. Tereso Newcomer, PA-C    09/04/2018 11:32 AM

## 2018-09-05 ENCOUNTER — Other Ambulatory Visit: Payer: Medicare Other

## 2018-09-05 ENCOUNTER — Other Ambulatory Visit: Payer: Self-pay

## 2018-09-05 DIAGNOSIS — E876 Hypokalemia: Secondary | ICD-10-CM

## 2018-09-05 LAB — BASIC METABOLIC PANEL
BUN/Creatinine Ratio: 13 (ref 12–28)
BUN: 14 mg/dL (ref 8–27)
CO2: 26 mmol/L (ref 20–29)
Calcium: 9.5 mg/dL (ref 8.7–10.3)
Chloride: 103 mmol/L (ref 96–106)
Creatinine, Ser: 1.04 mg/dL — ABNORMAL HIGH (ref 0.57–1.00)
GFR calc Af Amer: 60 mL/min/{1.73_m2} (ref >59)
GFR calc non Af Amer: 52 mL/min/{1.73_m2} — ABNORMAL LOW (ref >59)
Glucose: 91 mg/dL (ref 65–99)
Potassium: 4.4 mmol/L (ref 3.5–5.2)
Sodium: 145 mmol/L — ABNORMAL HIGH (ref 134–144)

## 2018-09-07 DIAGNOSIS — E785 Hyperlipidemia, unspecified: Secondary | ICD-10-CM | POA: Diagnosis not present

## 2018-09-11 MED FILL — GABAPENTIN 600 MG TABS: 600 | 30 days supply | Qty: 30 | Fill #1

## 2018-09-14 ENCOUNTER — Other Ambulatory Visit: Payer: Self-pay | Admitting: Neurology

## 2018-09-14 MED FILL — FLUDROCORTISONE 0.1 MG TAB: 0.1 | 30 days supply | Qty: 90 | Fill #0

## 2018-09-14 MED FILL — PROPRANOLOL ER 60 MG CAP: 60 | 30 days supply | Qty: 30 | Fill #9

## 2018-09-15 MED FILL — SIMVASTATIN 20 MG TABLET: 20 | 30 days supply | Qty: 30 | Fill #0

## 2018-09-21 MED FILL — SUMATRIPTAN SUCC 100 MG TAB: 100 | 30 days supply | Qty: 12 | Fill #1

## 2018-09-27 ENCOUNTER — Telehealth: Payer: Self-pay | Admitting: *Deleted

## 2018-09-27 ENCOUNTER — Encounter: Payer: Self-pay | Admitting: Neurology

## 2018-09-27 ENCOUNTER — Ambulatory Visit (INDEPENDENT_AMBULATORY_CARE_PROVIDER_SITE_OTHER): Payer: Medicare Other | Admitting: Neurology

## 2018-09-27 ENCOUNTER — Other Ambulatory Visit: Payer: Self-pay

## 2018-09-27 VITALS — BP 122/82 | HR 68 | Temp 97.7°F | Ht 67.0 in | Wt 201.5 lb

## 2018-09-27 DIAGNOSIS — R5383 Other fatigue: Secondary | ICD-10-CM | POA: Diagnosis not present

## 2018-09-27 DIAGNOSIS — E559 Vitamin D deficiency, unspecified: Secondary | ICD-10-CM

## 2018-09-27 DIAGNOSIS — G43709 Chronic migraine without aura, not intractable, without status migrainosus: Secondary | ICD-10-CM

## 2018-09-27 DIAGNOSIS — IMO0002 Reserved for concepts with insufficient information to code with codable children: Secondary | ICD-10-CM

## 2018-09-27 NOTE — Telephone Encounter (Signed)
The patient arrived to the office at 1:15pm for her appt.  She was still able to be seen.

## 2018-09-27 NOTE — Progress Notes (Signed)
PATIENT: Mallory Durham DOB: 03-Aug-1940  Chief Complaint  Patient presents with  . Migraine    Botox 100 units x 2 vials - office supply     HISTORICAL  Mallory Durham is a 78 year old female, seen in refer by primary care PA Fortino Sic, for evaluation of migraine headaches, chronic neck pain, initial evaluation was on June 01, 2017.  Reviewed and summarized the referring note, she has past medical history of hypothyroidism, on supplement, hyperlipidemia, chronic neck pain, chronic renal insufficiency,  Since 2016, she noticed progressive worsening dizziness, to the point now, she sits down most of the time, is not able to exercise, in addition, she is under a lot of stress, tried to see her husband at the nursing home on a daily basis.  She drinks 2 of 12 ounce of water bottle every day, but she complains lightheadedness shortly after standing up, mild improvement walking short distance, but with prolonged walking and standing, she would develop this washout sensation, lightheadedness, as if she is going to faint, she has to sit down to improve her symptoms.  During today's examination, she was found to have significant orthostatic blood pressure change in the setting of already low blood pressure, lying down blood pressure 97/70, heart rate of 83, standing up systolic blood pressure was 50 or less, required multiple measurement to register, she does become symptomatic, especially after standing up for more than 3 minutes.  Her symptoms gradually improved after lying flat.  Previously she was given prescription of midodrine 2.5 mg to get in the early morning, and before bed, there was no significant improvement noted.  She also noted gradual onset memory loss, difficulty focusing, she has history of migraine headache, increased migraine over the past few months, she could not tolerate Imitrex 100 mg as needed due to heart palpitation, she usually take half of 100 mg tablets,  if needed, she may repeat in couple hours, which take away the headache in few hours,  She is quite disabled by her dizziness, has to stay seated most of the time, she denies bilateral lower extremity swelling, no significant sensory loss.  UPDATE July 18 2017: MRI of the brain in February 2019, generalized atrophy, especially at the right perisylvian fissure area, mild supratentorium small vessel disease, there is no acute abnormality.  Laboratory evaluation showed A1c of 5.2, normal negative ANA, copper, C-reactive protein, ESR, RPR, B12, CMP, lipid profile, BNP was elevated 246   Significant low vitamin D 5.2,  Echocardiogram showed ejection fraction of 55-60%, wall motion was normal, aortic valve showed no stenosis, ventricular size was normal, systolic function was normal  She is taking gabapentin 69m bid, midodrine 10 mg 3 times a day, last dose was before she goes to bed, she also complains of chronic insomnia, taking Ambien 10 mg at nighttime  She still has depression, anxiety, she has frequent headaches, once or twice each day, she has been 50 mg as needed, 50 mg or Maxalt as needed for headaches, which helps her headache some  UPDATE November 01 2017: She is accompanied by her daughter [name] today's clinical visit, functional status has much declined, despite midodrine 10 mg 3 times a day, and the Mestinon 60 mg 3 times a day, she complains of lightheadedness, dizziness when getting up just a few minutes, today she had significant orthostatic pressure dropped [id number]/366/44to systolic 60 after standing up, difficult to register diastolic pressure.   She complains lightheadedness fainting sensation after lying down  for a few minutes,blood pressure points back to 120/80, her symptoms has much improved  She also complains of frequent migraine headaches, sometimes woke up with migraine headaches, taking Imitrex as needed  Chronic insomnia, Ambien 5 to 10 mg as needed  UPDATE Sept 9  2019: She is now taking fludrocortisone 0.1 mg 3 times a day, which has really helped her symptoms, along with Mestinon 60 mg daily, she is scheduled to see [facility] on February 07, 2018,  She complains of chronic migraine headaches for many years,3-4 times each week, right lateralized severe pounding headaches, with associated light noise sensitivity, Imitrex 100 mg as needed was helpful, still her headache last about 6 hours,  UPDATE May 02 2018: She is now taking fludrocortisone 0.3 mg 3 times a day, Mestinon 60 mg 3 times a day, midodrine was increased from 2.5 to 5 mg 3 times a day, orthostatic symptoms has much improved,  She has almost daily headache, more on right temporal region, imitrex 156m 1/2 tab daily,   She reported 40% improvement with Botox injection, she has less headache, less severe, but benefit short lasting,  UPDATE Sep 27 2018: She return for Botox injection as migraine prevention, this is the third time she had injection, she did reported at least moderate improvement of her migraine headaches, there was no significant side effect  She complains of excessive fatigue  REVIEW OF SYSTEMS: Full 14 system review of systems performed and notable only for above  All rest review of system were negative   ASSESSMENT AND PLAN  LDusti Tetrois a 78y.o. 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   Botox injection for chronic  migraine prevention, injection was performed according to [place] protocol,  5 units of Botox was injected into each side, for 31 injection sites, total of 155 units  Bilateral frontalis 4 injection sites Bilateral corrugate 2 injection sites Procerus 1 injection sites. Bilateral temporalis 8 injection sites Bilateral occipitalis 6 injection sites Bilateral cervical paraspinals 4 injection sites Bilateral upper trapezius 6 injection sites  Extra 45 unites were injected into bilateral upper cervical paraspinal muscles and right temporoparietal region  Lab evalution TSH, vitamin D, B12, for her complaints of excessive fatigue  YiMarcial PacasM.D. Ph.D.  [facility] [address] [address]h: [phone]: [phone]: WrFortino SicPAUtah

## 2018-09-27 NOTE — Telephone Encounter (Signed)
Patient did not show for her appt today.  We attempted to call the patient.  Left her a message requesting a call back to reschedule.

## 2018-09-27 NOTE — Progress Notes (Signed)
**  Botox 100 units x 2 vials, NDC 0023-1145-01, Lot [id number], Exp 01/2021, office supply.//mck,rn** 

## 2018-09-28 ENCOUNTER — Encounter: Payer: Self-pay | Admitting: Internal Medicine

## 2018-09-28 ENCOUNTER — Ambulatory Visit (INDEPENDENT_AMBULATORY_CARE_PROVIDER_SITE_OTHER): Payer: Medicare Other | Admitting: Internal Medicine

## 2018-09-28 ENCOUNTER — Other Ambulatory Visit: Payer: Self-pay

## 2018-09-28 ENCOUNTER — Telehealth: Payer: Self-pay | Admitting: Neurology

## 2018-09-28 DIAGNOSIS — E890 Postprocedural hypothyroidism: Secondary | ICD-10-CM | POA: Diagnosis not present

## 2018-09-28 LAB — VITAMIN D 25 HYDROXY (VIT D DEFICIENCY, FRACTURES): Vit D, 25-Hydroxy: 18.9 ng/mL — ABNORMAL LOW (ref 30.0–100.0)

## 2018-09-28 LAB — THYROID PANEL WITH TSH
Free Thyroxine Index: 2.1 (ref 1.2–4.9)
T3 Uptake Ratio: 27 % (ref 24–39)
T4, Total: 7.7 ug/dL (ref 4.5–12.0)
TSH: 4.7 u[IU]/mL — ABNORMAL HIGH (ref 0.450–4.500)

## 2018-09-28 LAB — VITAMIN B12: Vitamin B-12: 262 pg/mL (ref 232–1245)

## 2018-09-28 MED FILL — buPROPion HCL ER (XL) 150 M: 150 | 30 days supply | Qty: 30 | Fill #1

## 2018-09-28 NOTE — Telephone Encounter (Signed)
Please call patient, TSH was mildly elevated 4.7, low normal vitamin B12 262, vitamin D level was 19  She may contact her primary care physician for continue monitoring her thyroid function  She should take over vitamin B12 supplement, 1000 mcg daily vitamin D3 supplement 1000 units daily,

## 2018-09-28 NOTE — Telephone Encounter (Signed)
Spoke to patient - she is aware of her lab results and agreeable to start the recommended supplements.

## 2018-09-28 NOTE — Patient Instructions (Signed)
Please continue Synthroid 100 mcg daily.  Take the thyroid hormone every day, with water, at least 30 minutes before breakfast, separated by at least 4 hours from: - acid reflux medications - calcium - iron - multivitamins  Let's recheck your thyroid tests in 1.5 months.  Please come back for a follow-up appointment in 1 year.

## 2018-09-28 NOTE — Progress Notes (Signed)
Patient ID: Mallory Durham, female   DOB: Feb 27, 1941, 78 y.o.   MRN: [id number]  Patient location: Home My location: Office  Referring Provider: Clide Dales, PA  I connected with the patient on 09/28/18 at 10:51 AM EDT by a video enabled telemedicine application and verified that I am speaking with the correct person.   I discussed the limitations of evaluation and management by telemedicine and the availability of in person appointments. The patient expressed understanding and agreed to proceed.   Details of the encounter are shown below.  HPI  Mallory Durham is a 78 y.o.-year-old female, presenting for f/u for postsurgical hypothyroidism. Last visit 1 year ago.  She continues to have a lot of stress with her husband who has dementia.  She is now in a long-term facility.  She feels more tired.  Reviewed and addended history: 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  A TSH returned slightly low in 09/2016 and we decreased the dose at that time to 112 mcg daily.  At last visit, we had to decrease the dose even more to 100 mcg daily.  She continues on Synthroid d.a.w. 100 mcg daily (decreased at last visit from 112 mcg): - in am - fasting - at least 30 min from b'fast - no Ca, Fe, MVI - + PPIs as needed, later in the day - not on Biotin - + AREDs 2 (no Ca or iron) 2x a day, first dose in am, with b'fast  Reviewed patient's TFTs: Lab Results  Component Value Date   TSH 4.700 (H) 09/27/2018   TSH 0.83 11/15/2017   TSH 0.10 (L) 09/27/2017   TSH 0.45 11/30/2016   TSH 0.26 (L) 09/27/2016   TSH 0.38 07/05/2016   TSH 0.37 03/22/2016   TSH 0.24 (L) 11/20/2015   TSH 0.03 (L) 08/26/2015   TSH 0.17 (L) 06/05/2015   FREET4 0.94 11/15/2017   FREET4 1.01 09/27/2017   FREET4 0.99  11/30/2016   FREET4 1.13 09/27/2016   FREET4 0.95 07/05/2016   FREET4 1.22 03/22/2016   FREET4 0.92 11/20/2015   FREET4 1.44 08/26/2015  03/31/2015: TSH 6.82, fT4 1.24 01/22/2015: TSH 0.420 11/28/2014: TSH 5.440  TPO ABs per [facility] records - ? Why checked since pt had thyroidectomy in 2000...:  03/31/2015: TPO Abs <6 10/03/2013: TPO Abs 7 07/02/2010: TPO Abs <6  Pt denies: - feeling nodules in neck - hoarseness - dysphagia - choking - SOB with lying down  She has + FH of thyroid disorders in mother and sister: hypothyroidism. No FH of thyroid cancer. No h/o radiation tx to head or neck.  No herbal supplements. No Biotin use. No recent steroids use.   She also has a history of hysterectomy in 1994. Also, HL, GERD, depression, ruptured Achilles tendon 2015.  She is on ergocalciferol weekly.  She was dx'ed with autonomic dysfunction (low BP episodes) >> on Mestinon, midodrine.she sees neurology.  On nortriptyline for headache prevention.   She is on Gabapentin for neck muscle relaxation.  ROS: Constitutional: no weight gain/no weight loss, + fatigue, no subjective hyperthermia, no subjective hypothermia Eyes: no blurry vision, no xerophthalmia ENT: no sore throat, + see HPI Cardiovascular: no CP/+ SOB with exertion/no palpitations/no leg swelling Respiratory: no cough/+ SOB/no wheezing Gastrointestinal: no N/no V/no D/no C/no acid reflux Musculoskeletal: no muscle aches/no joint  aches Skin: no rashes, no hair loss Neurological: no tremors/no numbness/no tingling/no dizziness  I reviewed pt's medications, allergies, PMH, social hx, family hx, and changes were documented in the history of present illness. Otherwise, unchanged from my initial visit note.  Past Medical History:  Diagnosis Date  . Anxiety   . Confusion, hx of, without neuro findings   . Depression   . Hx of vertigo   . Hyperlipidemia   . Hypothyroid   . Left shoulder pain   . Migraines   . Neck pain    . Orthostatic hypotension   . Renal insufficiency    Past Surgical History:  Procedure Laterality Date  . ABDOMINAL HYSTERECTOMY    . ABDOMINAL HYSTERECTOMY    . BREAST LUMPECTOMY    . CHOLECYSTECTOMY    . JOINT REPLACEMENT    . THYROID SURGERY    . TONSILLECTOMY     Social History   Social History  . Marital status: Married    Spouse name: N/A  . Number of children: 2   Occupational History  .  Retired   Social History Main Topics  . Smoking status: Never Smoker  . Smokeless tobacco: Never Used  . Alcohol use No  . Drug use: No   Current Outpatient Medications on File Prior to Visit  Medication Sig Dispense Refill  . albuterol (PROVENTIL HFA;VENTOLIN HFA) 108 (90 Base) MCG/ACT inhaler Inhale 2 puffs into the lungs every 6 (six) hours as needed for wheezing or shortness of breath. 1 Inhaler 0  . aspirin-acetaminophen-caffeine (EXCEDRIN MIGRAINE) 250-250-65 MG tablet Take by mouth every 6 (six) hours as needed for headache.    . Bacillus Coagulans-Inulin (ALIGN PREBIOTIC-PROBIOTIC PO) Take 1 tablet by mouth daily.    . botulinum toxin Type A (BOTOX) 100 units SOLR injection Inject IM every 3 months by MD in the office 2 vial 3  . buPROPion (WELLBUTRIN XL) 150 MG 24 hr tablet Take 1 tablet (150 mg total) by mouth daily. 30 tablet 0  . fludrocortisone (FLORINEF) 0.1 MG tablet TAKE 1 TABLET (0.1 MG TOTAL) BY MOUTH 3 (THREE) TIMES DAILY. 90 tablet 6  . fluticasone (FLONASE) 50 MCG/ACT nasal spray Place into both nostrils daily as needed for allergies or rhinitis.    . fluticasone (FLOVENT HFA) 110 MCG/ACT inhaler Inhale 2 puffs into the lungs 2 (two) times daily as needed [employer](WHEZZING).    Marland Kitchen. gabapentin (NEURONTIN) 600 MG tablet Take 600 mg by mouth at bedtime.     . midodrine (PROAMATINE) 10 MG tablet Take 1 tablet (10 mg total) by mouth 3 (three) times daily. 270 tablet 3  . Multiple Vitamins-Minerals (PRESERVISION AREDS 2) CAPS Take 2 capsules by mouth.    . nortriptyline  (PAMELOR) 10 MG capsule TAKE 2 CAPSULES (20 MG TOTAL) BY MOUTH AT BEDTIME. 60 capsule 11  . phenazopyridine (PYRIDIUM) 100 MG tablet Take 100 mg by mouth 3 (three) times daily as needed for pain.    . potassium chloride (K-DUR) 10 MEQ tablet Take 2 tablets (20 mEq total) by mouth 2 (two) times daily. 120 tablet 0  . propranolol ER (INDERAL LA) 60 MG 24 hr capsule Take 1 capsule (60 mg total) by mouth at bedtime. 30 capsule 11  . sertraline (ZOLOFT) 100 MG tablet Take 100 mg by mouth daily.    . simvastatin (ZOCOR) 20 MG tablet Take 1 tablet (20 mg total) by mouth every evening. 90 tablet 1  . SUMAtriptan (IMITREX) 100 MG tablet Take 1 tab at  onset of migraine.  May repeat in 2 hrs, if needed.  Max dose: 2 tabs/day. This is a 30 day prescription. 12 tablet 11  . SYNTHROID 100 MCG tablet TAKE 1 TABLET (100 MCG TOTAL) BY MOUTH DAILY BEFORE BREAKFAST. 45 tablet 5  . tiZANidine (ZANAFLEX) 2 MG tablet Take 1 tablet (2 mg total) by mouth every 8 (eight) hours as needed for muscle spasms. 30 tablet 1  . zolpidem (AMBIEN) 10 MG tablet Take 0.5-1 tablets (5-10 mg total) by mouth at bedtime as needed. 30 tablet 5   No current facility-administered medications on file prior to visit.    No Known Allergies Family History  Problem Relation Age of Onset  . Heart disease Mother        Angina  . Cancer Mother   . Cancer Father   . Heart disease Father   . Cancer Maternal Aunt    PE: There were no vitals taken for this visit. Wt Readings from Last 3 Encounters:  09/27/18 201 lb 8 oz (91.4 kg)  07/21/18 202 lb 1.9 oz (91.7 kg)  05/02/18 202 lb (91.6 kg)   Constitutional:  in NAD  The physical exam was not performed (virtual visit).  ASSESSMENT: 1.  Postsurgical Hypothyroidism  PLAN:  1. Postsurgical Hypothyroidism - latest thyroid labs reviewed with pt >> normal 11/2017, but yesterday she had another set of TFTs and TSH was slightly high.  She is not sure why this happened since she did not miss  any doses since last visit. - she continues on LT4 100 Mcg daily (dose decreased at last visit) - pt feels good on this dose. - we discussed about taking the thyroid hormone every day, with water, >30 minutes before breakfast, separated by >4 hours from acid reflux medications, calcium, iron, multivitamins. Pt. is taking it correctly, and she does not miss doses. - I advised her to continue with the current dose of levothyroxine for now and try to repeat the test again in 1.5 months.  At that time, if the TSH is still high, we will need to increase the dose.  It may need to be necessary in the future to alternate between 100 and 112 mcg every other day. - If labs are abnormal, she will need to return for repeat TFTs in 1.5 months -  I will see her back in a year but we will be in touch about her labs sooner  Orders Placed This Encounter  Procedures  . TSH  . T4, free   - time spent with the patient: 15 min, of which >50% was spent in obtaining information about her symptoms, reviewing her previous labs, evaluations, and treatments, counseling her about her condition (please see the discussed topics above), and developing a plan to further investigate and treat it.  Carlus Pavlov, MD PhD [facility]

## 2018-09-29 MED ORDER — ONABOTULINUMTOXINA 100 UNITS IJ SOLR: 200.0000 [IU] | Freq: Once | INTRAMUSCULAR | Status: DC

## 2018-09-29 NOTE — Addendum Note (Signed)
Addended by: Levert Feinstein on: 09/29/2018 11:04 AM   Modules accepted: Orders

## 2018-10-05 ENCOUNTER — Other Ambulatory Visit: Payer: Self-pay | Admitting: *Deleted

## 2018-10-05 MED ORDER — PROPRANOLOL HCL ER 60 MG PO CP24: 60.0000 mg | ORAL_CAPSULE | Freq: Every day | ORAL | 3 refills | Status: DC

## 2018-10-10 ENCOUNTER — Other Ambulatory Visit: Payer: Self-pay | Admitting: Physician Assistant

## 2018-10-10 MED FILL — SYNTHROID 100 MCG TABLET: 100 | 45 days supply | Qty: 45 | Fill #2

## 2018-10-10 MED FILL — POTASSIUM CL ER 10 MEQ TAB: 10 | 30 days supply | Qty: 120 | Fill #0

## 2018-10-10 MED FILL — NORTRIPTYLINE HCL 10 MG CAP: 10 | 30 days supply | Qty: 60 | Fill #1

## 2018-10-17 ENCOUNTER — Telehealth: Payer: Self-pay | Admitting: *Deleted

## 2018-10-17 NOTE — Telephone Encounter (Signed)
Attempted pt contact numbers Home number no answer  Cell number not in service  Lvm on dtr vm to call clinic back about her mother appointment.

## 2018-10-18 NOTE — Telephone Encounter (Signed)
Attempted pt contact numbers Home number no answer  Cell number was able to leave a vm

## 2018-10-19 ENCOUNTER — Telehealth: Payer: Self-pay | Admitting: Neurology

## 2018-10-19 DIAGNOSIS — R519 Headache, unspecified: Secondary | ICD-10-CM

## 2018-10-19 NOTE — Telephone Encounter (Signed)
Spoke to patient who is concerned about a worsening headache over the last week.  She is experiencing throbbing pain in her head that feels different than her previous headaches.  She has only tried sumatriptan 100mg , 0.5 tablet twice with temporary relief.  Per Dr. Terrace Arabia, she may try an oral migraine cocktail of Imitrex, Zanaflex, Aleve and Benadryl.  She should also lay down to rest in a quiet, darkened environment to help resolve the pain.  She was instructed to call back if the pain does not improve.  Per Dr. Terrace Arabia, she will also order a CT Head if the patient wishes to have this scan.

## 2018-10-19 NOTE — Telephone Encounter (Signed)
Pt states she has a sever pain in her head that feeps like a throbbing sharp pain that she has never had before. This pain started 3 days ago. Please advise.

## 2018-10-19 NOTE — Addendum Note (Signed)
Addended by: Levert Feinstein on: 10/19/2018 04:09 PM   Modules accepted: Orders

## 2018-10-20 MED FILL — SUMATRIPTAN SUCC 100 MG TAB: 100 | 30 days supply | Qty: 12 | Fill #2

## 2018-10-20 MED FILL — FLUDROCORTISONE 0.1 MG TAB: 0.1 | 30 days supply | Qty: 90 | Fill #1

## 2018-10-20 MED FILL — tiZANidine HCL 2 MG TABS: 2 | 10 days supply | Qty: 30 | Fill #1

## 2018-10-20 MED FILL — PROPRANOLOL HCL ER 60 MG CP: 60 | 30 days supply | Qty: 30 | Fill #10

## 2018-10-20 MED FILL — ZOLPIDEM TARTRATE 10 MG TAB: 10 | 30 days supply | Qty: 30 | Fill #4

## 2018-10-20 MED FILL — SERTRALINE HCL 100 MG TAB: 100 | 30 days supply | Qty: 45 | Fill #3

## 2018-10-20 NOTE — Telephone Encounter (Signed)
Virtual Visit Pre-Appointment Phone Call  "(Name), I am calling you today to discuss your upcoming appointment. We are currently trying to limit exposure to the virus that causes COVID-19 by seeing patients at home rather than in the office."  1. "What is the BEST phone number to call the day of the visit?" - include this in appointment notes  2. "Do you have or have access to (through a family member/friend) a smartphone with video capability that we can use for your visit?" a. If yes - list this number in appt notes as "cell" (if different from BEST phone #) and list the appointment type as a VIDEO visit in appointment notes b. If no - list the appointment type as a PHONE visit in appointment notes  3. Confirm consent - "In the setting of the current Covid19 crisis, you are scheduled for a (phone or video) visit with your provider on (date) at (time).  Just as we do with many in-office visits, in order for you to participate in this visit, we must obtain consent.  If you'd like, I can send this to your mychart (if signed up) or email for you to review.  Otherwise, I can obtain your verbal consent now.  All virtual visits are billed to your insurance company just like a normal visit would be.  By agreeing to a virtual visit, we'd like you to understand that the technology does not allow for your provider to perform an examination, and thus may limit your provider's ability to fully assess your condition. If your provider identifies any concerns that need to be evaluated in person, we will make arrangements to do so.  Finally, though the technology is pretty good, we cannot assure that it will always work on either your or our end, and in the setting of a video visit, we may have to convert it to a phone-only visit.  In either situation, we cannot ensure that we have a secure connection.  Are you willing to proceed?" STAFF: Did the patient verbally acknowledge consent to telehealth visit? Document  YES/NO here: YES  4. Advise patient to be prepared - "Two hours prior to your appointment, go ahead and check your blood pressure, pulse, oxygen saturation, and your weight (if you have the equipment to check those) and write them all down. When your visit starts, your provider will ask you for this information. If you have an Apple Watch or Kardia device, please plan to have heart rate information ready on the day of your appointment. Please have a pen and paper handy nearby the day of the visit as well."  5. Give patient instructions for MyChart download to smartphone OR Doximity/Doxy.me as below if video visit (depending on what platform provider is using)  6. Inform patient they will receive a phone call 15 minutes prior to their appointment time (may be from unknown caller ID) so they should be prepared to answer    TELEPHONE CALL NOTE  Mallory Durham has been deemed a candidate for a follow-up tele-health visit to limit community exposure during the Covid-19 pandemic. I spoke with the patient via phone to ensure availability of phone/video source, confirm preferred email & phone number, and discuss instructions and expectations.  I reminded Mallory Durham to be prepared with any vital sign and/or heart rhythm information that could potentially be obtained via home monitoring, at the time of her visit. I reminded Mallory Durham to expect a phone call prior to her visit.  [name] 10/20/2018 11:39 AM   INSTRUCTIONS FOR DOWNLOADING THE MYCHART APP TO SMARTPHONE  - The patient must first make sure to have activated MyChart and know their login information - If Apple, go to [employer] Store and type in MyChart in the search bar and download the app. If Android, ask patient to go to [employer] Play Store and type in GolfMyChart in the search bar and download the app. The app is free but as with any other app downloads, their phone may require them to verify saved payment information or Apple/Android  password.  - The patient will need to then log into the app with their MyChart username and password, and select [place] as their healthcare provider to link the account. When it is time for your visit, go to the MyChart app, find appointments, and click Begin Video Visit. Be sure to Select Allow for your device to access the Microphone and Camera for your visit. You will then be connected, and your provider will be with you shortly.  **If they have any issues connecting, or need assistance please contact MyChart service desk [phone])**  **If using a computer, in order to ensure the best quality for their visit they will need to use either of the following Internet Browsers: [employer] Edge, or Google Chrome**  IF USING DOXIMITY or DOXY.ME - The patient will receive a link just prior to their visit by text.     FULL LENGTH CONSENT FOR TELE-HEALTH VISIT   I hereby voluntarily request, consent and authorize [facility] and its employed or contracted physicians, physician assistants, nurse practitioners or other licensed health care professionals (the Practitioner), to provide me with telemedicine health care services (the "Services") as deemed necessary by the treating Practitioner. I acknowledge and consent to receive the Services by the Practitioner via telemedicine. I understand that the telemedicine visit will involve communicating with the Practitioner through live audiovisual communication technology and the disclosure of certain medical information by electronic transmission. I acknowledge that I have been given the opportunity to request an in-person assessment or other available alternative prior to the telemedicine visit and am voluntarily participating in the telemedicine visit.  I understand that I have the right to withhold or withdraw my consent to the use of telemedicine in the course of my care at any time, without affecting my right to future care or treatment,  and that the Practitioner or I may terminate the telemedicine visit at any time. I understand that I have the right to inspect all information obtained and/or recorded in the course of the telemedicine visit and may receive copies of available information for a reasonable fee.  I understand that some of the potential risks of receiving the Services via telemedicine include:  Marland Kitchen. Delay or interruption in medical evaluation due to technological equipment failure or disruption; . Information transmitted may not be sufficient (e.g. poor resolution of images) to allow for appropriate medical decision making by the Practitioner; and/or  . In rare instances, security protocols could fail, causing a breach of personal health information.  Furthermore, I acknowledge that it is my responsibility to provide information about my medical history, conditions and care that is complete and accurate to the best of my ability. I acknowledge that Practitioner's advice, recommendations, and/or decision may be based on factors not within their control, such as incomplete or inaccurate data provided by me or distortions of diagnostic images or specimens that may result from electronic transmissions. I understand that the practice  of medicine is not an Chief Strategy Officer and that Practitioner makes no warranties or guarantees regarding treatment outcomes. I acknowledge that I will receive a copy of this consent concurrently upon execution via email to the email address I last provided but may also request a printed copy by calling the office of [place].    I understand that my insurance will be billed for this visit.   I have read or had this consent read to me. . I understand the contents of this consent, which adequately explains the benefits and risks of the Services being provided via telemedicine.  . I have been provided ample opportunity to ask questions regarding this consent and the Services and have had my questions  answered to my satisfaction. . I give my informed consent for the services to be provided through the use of telemedicine in my medical care  By participating in this telemedicine visit I agree to the above.

## 2018-10-22 NOTE — Progress Notes (Signed)
Virtual Visit via Telephone Note   This visit type was conducted due to national recommendations for restrictions regarding the COVID-19 Pandemic (e.g. social distancing) in an effort to limit this patient's exposure and mitigate transmission in our community.  Due to her co-morbid illnesses, this patient is at least at moderate risk for complications without adequate follow up.  This format is felt to be most appropriate for this patient at this time.  The patient did not have access to video technology/had technical difficulties with video requiring transitioning to audio format only (telephone).  All issues noted in this document were discussed and addressed.  No physical exam could be performed with this format.  Please refer to the patient's chart for her  consent to telehealth for [facility].   Date:  10/23/2018   ID:  Mallory Durham, DOB 1940/09/04, MRN [id number]  Patient Location: Home Provider Location: Home  PCP:  Clide DalesWright, Morgan Dionne, PA  Cardiologist:  Kristeen MissPhilip Nahser, MD   Electrophysiologist:  None   Evaluation Performed:  Follow-Up Visit  Chief Complaint:  FU on orthostatic hypotension  History of Present Illness:    Mallory Durham is a 78 y.o. 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  She established with Dr. Elease HashimotoNahser in September 2019.  She was last seen in March 2020.  Today, she notes that she has had a lot of fatigue.  Her neurologist just put her on Vit D and B12.  Her B12 levels were low.  She has seen her endocrinologist for her thyroid as well.  She has not had chest pain, significant shortness of breath, syncope, orthopnea.  She has some chronic ankle edema.  She continues to have issues with orthostatic intolerance.  Overall, her symptoms have improved on medication.    The patient does not have symptoms concerning for COVID-19 infection (fever, chills, cough, or new shortness of breath).    Past  Medical History:  Diagnosis Date  . Anxiety   . Confusion, hx of, without neuro findings   . Depression   . Hx of vertigo   . Hyperlipidemia   . Hypothyroid   . Left shoulder pain   . Migraines   . Neck pain   . Orthostatic hypotension   . Renal insufficiency    Past Surgical History:  Procedure Laterality Date  . ABDOMINAL HYSTERECTOMY    . ABDOMINAL HYSTERECTOMY    . BREAST LUMPECTOMY    . CHOLECYSTECTOMY    . JOINT REPLACEMENT    . THYROID SURGERY    . TONSILLECTOMY       Current Meds  Medication Sig  . albuterol (PROVENTIL HFA;VENTOLIN HFA) 108 (90 Base) MCG/ACT inhaler Inhale 2 puffs into the lungs every 6 (six) hours as needed for wheezing or shortness of breath.  Marland Kitchen. aspirin-acetaminophen-caffeine (EXCEDRIN MIGRAINE) 250-250-65 MG tablet Take by mouth every 6 (six) hours as needed for headache.  . Bacillus Coagulans-Inulin (ALIGN PREBIOTIC-PROBIOTIC PO) Take 1 tablet by mouth daily.  . botulinum toxin Type A (BOTOX) 100 units SOLR injection Inject IM every 3 months by MD in the office  . buPROPion (WELLBUTRIN XL) 150 MG 24 hr tablet Take 1 tablet (150 mg total) by mouth daily.  . Cholecalciferol (D3-1000 PO) Take 1 tablet by mouth daily.  . fludrocortisone (FLORINEF) 0.1 MG tablet TAKE 1 TABLET (0.1 MG TOTAL) BY MOUTH 3 (THREE) TIMES DAILY.  . fluticasone (FLONASE) 50 MCG/ACT nasal spray Place into both nostrils daily as needed for allergies  or rhinitis.  . fluticasone (FLOVENT HFA) 110 MCG/ACT inhaler Inhale 2 puffs into the lungs 2 (two) times daily as needed [facility](WHEZZING).  Marland Kitchen. gabapentin (NEURONTIN) 600 MG tablet Take 600 mg by mouth at bedtime.   . midodrine (PROAMATINE) 10 MG tablet Take 1 tablet (10 mg total) by mouth 3 (three) times daily.  . Multiple Vitamins-Minerals (PRESERVISION AREDS 2) CAPS Take 2 capsules by mouth.  . nortriptyline (PAMELOR) 10 MG capsule TAKE 2 CAPSULES (20 MG TOTAL) BY MOUTH AT BEDTIME.  Marland Kitchen. phenazopyridine (PYRIDIUM) 100 MG tablet Take 100 mg  by mouth 3 (three) times daily as needed for pain.  . potassium chloride (K-DUR) 10 MEQ tablet TAKE 2 TABLETS BY MOUTH 2 TIMES A DAY  . propranolol ER (INDERAL LA) 60 MG 24 hr capsule Take 1 capsule (60 mg total) by mouth at bedtime.  . sertraline (ZOLOFT) 100 MG tablet Take 100 mg by mouth daily.  . simvastatin (ZOCOR) 20 MG tablet Take 1 tablet (20 mg total) by mouth every evening.  . SUMAtriptan (IMITREX) 100 MG tablet Take 1 tab at onset of migraine.  May repeat in 2 hrs, if needed.  Max dose: 2 tabs/day. This is a 30 day prescription.  Marland Kitchen. SYNTHROID 100 MCG tablet TAKE 1 TABLET (100 MCG TOTAL) BY MOUTH DAILY BEFORE BREAKFAST.  Marland Kitchen. tiZANidine (ZANAFLEX) 2 MG tablet Take 1 tablet (2 mg total) by mouth every 8 (eight) hours as needed for muscle spasms.  . vitamin B-12 (CYANOCOBALAMIN) 1000 MCG tablet Take 1,000 mcg by mouth daily.  Marland Kitchen. zolpidem (AMBIEN) 10 MG tablet Take 0.5-1 tablets (5-10 mg total) by mouth at bedtime as needed.   Current Facility-Administered Medications for the 10/23/18 encounter (Telemedicine) with Tereso NewcomerWeaver, Vickie Ponds T, PA-C  Medication  . botulinum toxin Type A (BOTOX) injection 200 Units     Allergies:   Patient has no known allergies.   Social History   Tobacco Use  . Smoking status: Never Smoker  . Smokeless tobacco: Never Used  Substance Use Topics  . Alcohol use: No  . Drug use: No     Family Hx: The patient's family history includes Cancer in her father, maternal aunt, and mother; Heart disease in her father and mother.  ROS:   Please see the history of present illness.    All other systems reviewed and are negative.   Prior CV studies:   The following studies were reviewed today:  Echocardiogram 07/01/2017 EF 55-60, normal wall motion, grade 2 diastolic dysfunction, mild AI, normal RV SF, PASP 28  Labs/Other Tests and Data Reviewed:    EKG:  No ECG reviewed.  Recent Labs: 09/05/2018: BUN 14; Creatinine, Ser 1.04; Potassium 4.4; Sodium 145 09/27/2018:  TSH 4.700   Recent Lipid Panel Lab Results  Component Value Date/Time   CHOL 151 10/03/2013   TRIG 100 10/03/2013   HDL 61 10/03/2013   LDLCALC 70 10/03/2013    Wt Readings from Last 3 Encounters:  10/23/18 201 lb (91.2 kg)  09/27/18 201 lb 8 oz (91.4 kg)  07/21/18 202 lb 1.9 oz (91.7 kg)     Objective:    Vital Signs:  Ht 5\' 7"  (1.702 m)   Wt 201 lb (91.2 kg)   BMI 31.48 kg/m    VITAL SIGNS:  reviewed GEN:  no acute distress RESPIRATORY:  no labored breathing NEURO:  alert and oriented PSYCH:  good mood  ASSESSMENT & PLAN:    Orthostatic hypotension Her symptoms remain improved.  However, she continues to  have daily symptoms.  She is on max dose midodrine.  She has not been checking her BP.  She does not wear compression stockings.  I have asked her to monitor her BP and send me some readings in a week or 2.  We could consider increasing her fludrocortisone further if necessary.  I have asked her to try to wear knee high compression stockings to see if this improves her orthostatic intolerance.  I suspect her fatigue is related to her low B12 and replacement was just started. She takes propranolol for prevention of migraine headaches.  She has been on this for a long time, so it is not likely causing her fatigue.  But, I have asked her to discuss with Dr. Krista Blue if her symptoms continue.    COVID-19 Education: The signs and symptoms of COVID-19 were discussed with the patient and how to seek care for testing (follow up with PCP or arrange E-visit).  The importance of social distancing was discussed today.  Time:   Today, I have spent 15 minutes with the patient with telehealth technology discussing the above problems.     Medication Adjustments/Labs and Tests Ordered: Current medicines are reviewed at length with the patient today.  Concerns regarding medicines are outlined above.   Tests Ordered: No orders of the defined types were placed in this encounter.    Medication Changes: No orders of the defined types were placed in this encounter.   Disposition:  Follow up in 6 month(s) with Dr. Acie Fredrickson   Signed, Richardson Dopp, PA-C  10/23/2018 2:42 PM    [facility]

## 2018-10-23 ENCOUNTER — Telehealth (INDEPENDENT_AMBULATORY_CARE_PROVIDER_SITE_OTHER): Payer: Medicare Other | Admitting: Physician Assistant

## 2018-10-23 ENCOUNTER — Telehealth: Payer: Self-pay | Admitting: Neurology

## 2018-10-23 ENCOUNTER — Encounter: Payer: Self-pay | Admitting: Physician Assistant

## 2018-10-23 ENCOUNTER — Other Ambulatory Visit: Payer: Self-pay

## 2018-10-23 VITALS — Ht 67.0 in | Wt 201.0 lb

## 2018-10-23 DIAGNOSIS — I951 Orthostatic hypotension: Secondary | ICD-10-CM

## 2018-10-23 DIAGNOSIS — Z7189 Other specified counseling: Secondary | ICD-10-CM

## 2018-10-23 NOTE — Patient Instructions (Addendum)
Medication Instructions:  Your physician recommends that you continue on your current medications as directed. Please refer to the Current Medication list given to you today.  If you need a refill on your cardiac medications before your next appointment, please call your pharmacy.   Lab work: NONE If you have labs (blood work) drawn today and your tests are completely normal, you will receive your results only by: Marland Kitchen MyChart Message (if you have MyChart) OR . A paper copy in the mail If you have any lab test that is abnormal or we need to change your treatment, we will call you to review the results.  Testing/Procedures: NONE  Follow-Up: At [facility], you and your health needs are our priority.  As part of our continuing mission to provide you with exceptional heart care, we have created designated Provider Care Teams.  These Care Teams include your primary Cardiologist (physician) and Advanced Practice Providers (APPs -  Physician Assistants and Nurse Practitioners) who all work together to provide you with the care you need, when you need it. You will need a follow up appointment in:  6 months.  Please call our office 2 months in advance to schedule this appointment.  You may see Mertie Moores, MD or one of the following Advanced Practice Providers on your designated Care Team: Richardson Dopp, PA-C [place], [place] . Daune Perch, NP  Any Other Special Instructions Will Be Listed Below (If Applicable).  1. WEAR COMPRESSION STOCKINGS DAILY TO HELP PREVENT BLOOD PRESSURE DROP WHEN STANDING.  2. Please check your seated blood pressure 2-3 times per week for 2 weeks and send readings to [employer], PA-C.

## 2018-10-23 NOTE — Telephone Encounter (Signed)
Medicare/[employer] order sent to GI. They will reach out to the patient to schedule.  °

## 2018-11-07 MED FILL — buPROPion HCL ER (XL) 150 M: 150 | 30 days supply | Qty: 30 | Fill #2

## 2018-11-08 MED FILL — GABAPENTIN 600 MG TAB: 600 | 30 days supply | Qty: 30 | Fill #0

## 2018-11-09 ENCOUNTER — Ambulatory Visit
Admission: RE | Admit: 2018-11-09 | Discharge: 2018-11-09 | Disposition: A | Discharge status: Home / Self Care | Payer: Medicare Other | Source: Ambulatory Visit | Attending: Neurology | Admitting: Neurology

## 2018-11-09 ENCOUNTER — Other Ambulatory Visit (INDEPENDENT_AMBULATORY_CARE_PROVIDER_SITE_OTHER): Payer: Medicare Other

## 2018-11-09 ENCOUNTER — Other Ambulatory Visit: Payer: Self-pay

## 2018-11-09 DIAGNOSIS — R519 Headache, unspecified: Secondary | ICD-10-CM

## 2018-11-09 DIAGNOSIS — E890 Postprocedural hypothyroidism: Secondary | ICD-10-CM

## 2018-11-09 DIAGNOSIS — R51 Headache: Secondary | ICD-10-CM | POA: Diagnosis not present

## 2018-11-09 LAB — T4, FREE: Free T4: 1.13 ng/dL (ref 0.60–1.60)

## 2018-11-09 LAB — TSH: TSH: 1.6 u[IU]/mL (ref 0.35–4.50)

## 2018-11-10 ENCOUNTER — Other Ambulatory Visit: Payer: Medicare Other

## 2018-11-14 ENCOUNTER — Telehealth: Payer: Self-pay | Admitting: *Deleted

## 2018-11-14 NOTE — Telephone Encounter (Signed)
-----   Message from Marcial Pacas, MD sent at 11/14/2018  8:09 AM EDT ----- Please call pt for normal CT brain.

## 2018-11-14 NOTE — Telephone Encounter (Signed)
I have spoken with the patient and she is aware of her CT results.  Says the sharp, shooting pains have resolved.  She will still having intermittent, throbbing pain but feels, overall, she is getting better.

## 2018-11-16 ENCOUNTER — Other Ambulatory Visit: Payer: Self-pay | Admitting: Neurology

## 2018-11-16 MED FILL — SUMATRIPTAN SUCC 100 MG TAB: 100 | 30 days supply | Qty: 12 | Fill #3

## 2018-11-16 MED FILL — FLUDROCORTISONE 0.1 MG TAB: 0.1 | 30 days supply | Qty: 90 | Fill #2

## 2018-11-16 MED FILL — ZOLPIDEM TARTRATE 10 MG TAB: 10 | 30 days supply | Qty: 30 | Fill #0

## 2018-12-04 ENCOUNTER — Other Ambulatory Visit: Payer: Self-pay | Admitting: *Deleted

## 2018-12-04 MED ORDER — PROPRANOLOL HCL ER 60 MG PO CP24: 60.0000 mg | ORAL_CAPSULE | Freq: Every day | ORAL | 3 refills | Status: DC

## 2018-12-04 MED FILL — SERTRALINE HCL 100 MG TAB: 100 | 30 days supply | Qty: 45 | Fill #4

## 2018-12-04 MED FILL — SYNTHROID 100 MCG TABLET: 100 | 45 days supply | Qty: 45 | Fill #3

## 2018-12-04 MED FILL — PROPRANOLOL HCL ER 60 MG CP: 60 | 90 days supply | Qty: 90 | Fill #0

## 2018-12-04 MED FILL — MIDODRINE HCL 10 MG TABLET: 10 | 90 days supply | Qty: 270 | Fill #1

## 2018-12-04 MED FILL — buPROPion HCL ER (XL) 150 M: 150 | 30 days supply | Qty: 30 | Fill #3

## 2018-12-05 ENCOUNTER — Other Ambulatory Visit: Payer: Self-pay | Admitting: Physician Assistant

## 2018-12-05 MED ORDER — POTASSIUM CHLORIDE ER 10 MEQ PO TBCR: EXTENDED_RELEASE_TABLET | ORAL | 11 refills | Status: DC

## 2018-12-05 MED FILL — POTASSIUM CL ER 10 MEQ TAB: 10 | 30 days supply | Qty: 120 | Fill #0

## 2018-12-06 DIAGNOSIS — H903 Sensorineural hearing loss, bilateral: Secondary | ICD-10-CM | POA: Diagnosis not present

## 2018-12-06 DIAGNOSIS — H6121 Impacted cerumen, right ear: Secondary | ICD-10-CM | POA: Diagnosis not present

## 2018-12-14 MED FILL — ZOLPIDEM TARTRATE 10 MG TAB: 10 | 30 days supply | Qty: 30 | Fill #1

## 2018-12-14 MED FILL — SUMATRIPTAN SUCC 100 MG TAB: 100 | 30 days supply | Qty: 12 | Fill #4

## 2018-12-15 MED FILL — POTASSIUM CL ER 10 MEQ TAB: 10 | 30 days supply | Qty: 120 | Fill #0

## 2018-12-27 DIAGNOSIS — H01001 Unspecified blepharitis right upper eyelid: Secondary | ICD-10-CM | POA: Diagnosis not present

## 2018-12-27 DIAGNOSIS — H353132 Nonexudative age-related macular degeneration, bilateral, intermediate dry stage: Secondary | ICD-10-CM | POA: Diagnosis not present

## 2018-12-27 DIAGNOSIS — H524 Presbyopia: Secondary | ICD-10-CM | POA: Diagnosis not present

## 2018-12-27 DIAGNOSIS — H04123 Dry eye syndrome of bilateral lacrimal glands: Secondary | ICD-10-CM | POA: Diagnosis not present

## 2018-12-27 DIAGNOSIS — H01004 Unspecified blepharitis left upper eyelid: Secondary | ICD-10-CM | POA: Diagnosis not present

## 2018-12-27 DIAGNOSIS — H40013 Open angle with borderline findings, low risk, bilateral: Secondary | ICD-10-CM | POA: Diagnosis not present

## 2018-12-27 DIAGNOSIS — H5213 Myopia, bilateral: Secondary | ICD-10-CM | POA: Diagnosis not present

## 2019-01-03 ENCOUNTER — Ambulatory Visit (INDEPENDENT_AMBULATORY_CARE_PROVIDER_SITE_OTHER): Payer: Medicare Other | Admitting: Neurology

## 2019-01-03 ENCOUNTER — Other Ambulatory Visit: Payer: Self-pay

## 2019-01-03 ENCOUNTER — Encounter: Payer: Self-pay | Admitting: Neurology

## 2019-01-03 VITALS — BP 138/88 | HR 78 | Temp 97.8°F | Ht 67.0 in | Wt 201.5 lb

## 2019-01-03 DIAGNOSIS — G43709 Chronic migraine without aura, not intractable, without status migrainosus: Secondary | ICD-10-CM | POA: Diagnosis not present

## 2019-01-03 DIAGNOSIS — IMO0002 Reserved for concepts with insufficient information to code with codable children: Secondary | ICD-10-CM

## 2019-01-03 MED ORDER — ONABOTULINUMTOXINA 100 UNITS IJ SOLR
200.0000 [IU] | Freq: Once | INTRAMUSCULAR | Status: AC
  Administered 2019-01-03: 200 [IU] via INTRAMUSCULAR

## 2019-01-03 NOTE — Progress Notes (Signed)
PATIENT: Mallory Durham DOB: 03-Aug-1940  Chief Complaint  Patient presents with  . Migraine    Botox 100 units x 2 vials - office supply     HISTORICAL  Mallory Durham is a 78 year old female, seen in refer by primary care PA Fortino Sic, for evaluation of migraine headaches, chronic neck pain, initial evaluation was on June 01, 2017.  Reviewed and summarized the referring note, she has past medical history of hypothyroidism, on supplement, hyperlipidemia, chronic neck pain, chronic renal insufficiency,  Since 2016, she noticed progressive worsening dizziness, to the point now, she sits down most of the time, is not able to exercise, in addition, she is under a lot of stress, tried to see her husband at the nursing home on a daily basis.  She drinks 2 of 12 ounce of water bottle every day, but she complains lightheadedness shortly after standing up, mild improvement walking short distance, but with prolonged walking and standing, she would develop this washout sensation, lightheadedness, as if she is going to faint, she has to sit down to improve her symptoms.  During today's examination, she was found to have significant orthostatic blood pressure change in the setting of already low blood pressure, lying down blood pressure 97/70, heart rate of 83, standing up systolic blood pressure was 50 or less, required multiple measurement to register, she does become symptomatic, especially after standing up for more than 3 minutes.  Her symptoms gradually improved after lying flat.  Previously she was given prescription of midodrine 2.5 mg to get in the early morning, and before bed, there was no significant improvement noted.  She also noted gradual onset memory loss, difficulty focusing, she has history of migraine headache, increased migraine over the past few months, she could not tolerate Imitrex 100 mg as needed due to heart palpitation, she usually take half of 100 mg tablets,  if needed, she may repeat in couple hours, which take away the headache in few hours,  She is quite disabled by her dizziness, has to stay seated most of the time, she denies bilateral lower extremity swelling, no significant sensory loss.  UPDATE July 18 2017: MRI of the brain in February 2019, generalized atrophy, especially at the right perisylvian fissure area, mild supratentorium small vessel disease, there is no acute abnormality.  Laboratory evaluation showed A1c of 5.2, normal negative ANA, copper, C-reactive protein, ESR, RPR, B12, CMP, lipid profile, BNP was elevated 246   Significant low vitamin D 5.2,  Echocardiogram showed ejection fraction of 55-60%, wall motion was normal, aortic valve showed no stenosis, ventricular size was normal, systolic function was normal  She is taking gabapentin 69m bid, midodrine 10 mg 3 times a day, last dose was before she goes to bed, she also complains of chronic insomnia, taking Ambien 10 mg at nighttime  She still has depression, anxiety, she has frequent headaches, once or twice each day, she has been 50 mg as needed, 50 mg or Maxalt as needed for headaches, which helps her headache some  UPDATE November 01 2017: She is accompanied by her daughter Mallory Durham today's clinical visit, functional status has much declined, despite midodrine 10 mg 3 times a day, and the Mestinon 60 mg 3 times a day, she complains of lightheadedness, dizziness when getting up just a few minutes, today she had significant orthostatic pressure dropped [id number]/366/44to systolic 60 after standing up, difficult to register diastolic pressure.   She complains lightheadedness fainting sensation after lying down  for a few minutes,blood pressure points back to 120/80, her symptoms has much improved  She also complains of frequent migraine headaches, sometimes woke up with migraine headaches, taking Imitrex as needed  Chronic insomnia, Ambien 5 to 10 mg as needed  UPDATE Sept 9  2019: She is now taking fludrocortisone 0.1 mg 3 times a day, which has really helped her symptoms, along with Mestinon 60 mg daily, she is scheduled to see [facility] on February 07, 2018,  She complains of chronic migraine headaches for many years,3-4 times each week, right lateralized severe pounding headaches, with associated light noise sensitivity, Imitrex 100 mg as needed was helpful, still her headache last about 6 hours,  UPDATE May 02 2018: She is now taking fludrocortisone 0.3 mg 3 times a day, Mestinon 60 mg 3 times a day, midodrine was increased from 2.5 to 5 mg 3 times a day, orthostatic symptoms has much improved,  She has almost daily headache, more on right temporal region, imitrex 183m 1/2 tab daily,   She reported 40% improvement with Botox injection, she has less headache, less severe, but benefit short lasting,  UPDATE Sep 27 2018: She return for Botox injection as migraine prevention, this is the third time she had injection, she did reported at least moderate improvement of her migraine headaches, there was no significant side effect  She complains of excessive fatigue  UPDATE January 03 2019: She reported 50% improvement of her migraine headache, they are less severe, less frequent,   I personally reviewed CT head without contrast on November 09, 2018 that was normal Laboratory evaluations in May 2020 showed low normal B12 262, she is on supplement, Vitamin D was 128 she has normal thyroid panel  REVIEW OF SYSTEMS: Full 14 system review of systems performed and notable only for above  All rest review of system were negative   ASSESSMENT AND PLAN  Mallory Durham a 78y.o. 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   Botox injection for chronic migraine prevention, injection was performed according to [place] protocol,  5 units of Botox was injected into each side, for 31 injection sites, total of 155 units  Bilateral frontalis 4 injection sites Bilateral corrugate 2 injection sites Procerus 1 injection sites. Bilateral temporalis 8 injection sites Bilateral occipitalis 6 injection sites Bilateral cervical paraspinals 4 injection sites Bilateral upper trapezius 6 injection sites  Extra 45 unites were injected into bilateral masseters muscle and right temporoparietal region    YiMarcial PacasM.D. Ph.D.  [facility] [address]ers[place] [id number]: [phone]: [phone]: WrFortino SicPAU[place]

## 2019-01-03 NOTE — Progress Notes (Signed)
**  Botox 100 units x 1 vials, NDC 3474-2595-63, Lot [id number], Exp 04/2021, office supply.//mck,rn**

## 2019-01-04 MED FILL — NORTRIPTYLINE HCL 10 MG CAP: 10 | 30 days supply | Qty: 60 | Fill #2

## 2019-01-04 MED FILL — FLUDROCORTISONE 0.1 MG TAB: 0.1 | 30 days supply | Qty: 90 | Fill #3

## 2019-01-10 ENCOUNTER — Telehealth: Payer: Self-pay | Admitting: *Deleted

## 2019-01-10 NOTE — Telephone Encounter (Signed)
Per vo by Dr. 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  This request was ordered via our office online account under Rossie Muskrat, RN.  The patient is aware to expect a call from the [facility] after an insurance benefits investigation has been completed.  If her co-pay is too high, then she is aware they can offer financial assistance.  I have mailed her a "billing & payment services" pamphlet with this information.  Additionally, the patient will need to take a copy of the printed orders to a specific lab site.  The lab identified closest to her home address is:  [facility] located at [address], [place], [place], [place] [address] (directly behind [address]). Office hours M-F from 8-5, Sat from 9-12.  She may schedule an appt by calling [phone] or she may walk-in anytime during their business hours. The lab information is included on the printed orders.  She will need to take her driver's license with her.  The patient would like the orders mailed to her home address.

## 2019-01-12 MED FILL — ZOLPIDEM TARTRATE 10 MG TAB: 10 | 30 days supply | Qty: 30 | Fill #2

## 2019-01-12 MED FILL — SUMATRIPTAN SUCC 100 MG TAB: 100 | 30 days supply | Qty: 12 | Fill #5

## 2019-01-18 ENCOUNTER — Encounter: Payer: Self-pay | Admitting: Neurology

## 2019-01-18 DIAGNOSIS — G909 Disorder of the autonomic nervous system, unspecified: Secondary | ICD-10-CM | POA: Diagnosis not present

## 2019-01-25 NOTE — Telephone Encounter (Signed)
Results are back from [facility]. Printed pdf report and gave to Dr. Krista Blue to review.

## 2019-01-25 NOTE — Telephone Encounter (Signed)
Dr. Krista Blue reviewed. One portion of test still pending. Once complete results are received, she will evaluate and the patient will be contacted.

## 2019-01-26 MED FILL — SYNTHROID 100 MCG TABLET: 100 | 45 days supply | Qty: 45 | Fill #4

## 2019-01-29 NOTE — Telephone Encounter (Signed)
Lab results completed.  Printed results provided for Dr. Krista Blue to review.

## 2019-01-29 NOTE — Telephone Encounter (Signed)
Please call patient for negative [facility]

## 2019-01-29 NOTE — Telephone Encounter (Signed)
I spoke to the patient and she verbalized understanding of her lab results. 

## 2019-02-06 ENCOUNTER — Other Ambulatory Visit: Payer: Self-pay | Admitting: Family

## 2019-02-07 MED FILL — SERTRALINE HCL 100 MG TABS: 100 | 30 days supply | Qty: 45 | Fill #5

## 2019-02-07 MED FILL — buPROPion HCL ER (XL) 150 M: 150 | 30 days supply | Qty: 30 | Fill #4

## 2019-02-07 MED FILL — FLUDROCORTISONE 0.1 MG TAB: 0.1 | 30 days supply | Qty: 90 | Fill #4

## 2019-02-20 MED FILL — ZOLPIDEM TARTRATE 10 MG TAB: 10 | 30 days supply | Qty: 30 | Fill #3

## 2019-03-08 MED FILL — SYNTHROID 100 MCG TABLET: 100 | 45 days supply | Qty: 45 | Fill #5

## 2019-03-08 MED FILL — POTASSIUM CL ER 10 MEQ TAB: 10 | 30 days supply | Qty: 120 | Fill #1

## 2019-03-12 MED FILL — SUMATRIPTAN SUCC 100 MG TAB: 100 | 30 days supply | Qty: 12 | Fill #7

## 2019-03-15 ENCOUNTER — Other Ambulatory Visit: Payer: Self-pay | Admitting: Neurology

## 2019-03-16 MED FILL — FLUDROCORTISONE 0.1 MG TAB: 0.1 | 30 days supply | Qty: 90 | Fill #5

## 2019-03-16 MED FILL — buPROPion HCL ER (XL) 150 M: 150 | 30 days supply | Qty: 30 | Fill #5

## 2019-03-20 MED FILL — ZOLPIDEM TARTRATE 10 MG TAB: 10 | 30 days supply | Qty: 30 | Fill #4

## 2019-03-26 MED FILL — PROPRANOLOL HCL ER 60 MG CP: 60 | 90 days supply | Qty: 90 | Fill #1

## 2019-04-11 ENCOUNTER — Other Ambulatory Visit: Payer: Self-pay

## 2019-04-11 ENCOUNTER — Ambulatory Visit: Payer: Medicare Other | Admitting: Neurology

## 2019-04-11 ENCOUNTER — Other Ambulatory Visit: Payer: Self-pay | Admitting: Neurology

## 2019-04-11 MED FILL — ONDANSETRON HCL 4 MG TABLET: 4 | 7 days supply | Qty: 20 | Fill #0

## 2019-04-11 MED FILL — SUMATRIPTAN SUCC 100 MG TAB: 100 | 30 days supply | Qty: 12 | Fill #8

## 2019-04-16 ENCOUNTER — Other Ambulatory Visit: Payer: Self-pay | Admitting: Internal Medicine

## 2019-04-16 MED FILL — ZOLPIDEM TARTRATE 10 MG TAB: 10 | 30 days supply | Qty: 30 | Fill #5

## 2019-04-16 MED FILL — SYNTHROID 100 MCG TABLET: 100 | 45 days supply | Qty: 45 | Fill #0

## 2019-04-17 ENCOUNTER — Encounter: Payer: Self-pay | Admitting: Neurology

## 2019-04-17 ENCOUNTER — Ambulatory Visit (INDEPENDENT_AMBULATORY_CARE_PROVIDER_SITE_OTHER): Payer: Medicare Other | Admitting: Neurology

## 2019-04-17 ENCOUNTER — Other Ambulatory Visit: Payer: Self-pay

## 2019-04-17 VITALS — BP 102/62 | HR 58 | Temp 97.2°F | Ht 67.0 in | Wt 203.5 lb

## 2019-04-17 DIAGNOSIS — R269 Unspecified abnormalities of gait and mobility: Secondary | ICD-10-CM

## 2019-04-17 DIAGNOSIS — G43709 Chronic migraine without aura, not intractable, without status migrainosus: Secondary | ICD-10-CM

## 2019-04-17 DIAGNOSIS — I951 Orthostatic hypotension: Secondary | ICD-10-CM | POA: Diagnosis not present

## 2019-04-17 DIAGNOSIS — L299 Pruritus, unspecified: Secondary | ICD-10-CM

## 2019-04-17 DIAGNOSIS — IMO0002 Reserved for concepts with insufficient information to code with codable children: Secondary | ICD-10-CM

## 2019-04-17 MED ORDER — GABAPENTIN 100 MG PO CAPS: 100.0000 mg | ORAL_CAPSULE | Freq: Three times a day (TID) | ORAL | 11 refills | Status: DC

## 2019-04-17 MED ORDER — ONABOTULINUMTOXINA 100 UNITS IJ SOLR
200.0000 [IU] | Freq: Once | INTRAMUSCULAR | Status: AC
  Administered 2019-04-17: 17:00:00 200 [IU] via INTRAMUSCULAR

## 2019-04-17 MED FILL — GABAPENTIN 100 MG CAPSULE: 100 | 30 days supply | Qty: 90 | Fill #0

## 2019-04-17 NOTE — Progress Notes (Signed)
PATIENT: Mallory Durham DOB: 1940-05-25  Chief Complaint  Patient presents with  . Migraine    Botox 200 units x 1 vial - office supply.  She has noticed an increase in migraines but says she stopped nortriptyline.  She has it at home and plans to restart it.     HISTORICAL  Mallory Durham is a 78 year old female, seen in refer by primary care PA Fortino Sic, for evaluation of migraine headaches, chronic neck pain, initial evaluation was on June 01, 2017.  Reviewed and summarized the referring note, she has past medical history of hypothyroidism, on supplement, hyperlipidemia, chronic neck pain, chronic renal insufficiency,  Since 2016, she noticed progressive worsening dizziness, to the point now, she sits down most of the time, is not able to exercise, in addition, she is under a lot of stress, tried to see her husband at the nursing home on a daily basis.  She drinks 2 of 12 ounce of water bottle every day, but she complains lightheadedness shortly after standing up, mild improvement walking short distance, but with prolonged walking and standing, she would develop this washout sensation, lightheadedness, as if she is going to faint, she has to sit down to improve her symptoms.  During today's examination, she was found to have significant orthostatic blood pressure change in the setting of already low blood pressure, lying down blood pressure 97/70, heart rate of 83, standing up systolic blood pressure was 50 or less, required multiple measurement to register, she does become symptomatic, especially after standing up for more than 3 minutes.  Her symptoms gradually improved after lying flat.  Previously she was given prescription of midodrine 2.5 mg to get in the early morning, and before bed, there was no significant improvement noted.  She also noted gradual onset memory loss, difficulty focusing, she has history of migraine headache, increased migraine over the past few  months, she could not tolerate Imitrex 100 mg as needed due to heart palpitation, she usually take half of 100 mg tablets, if needed, she may repeat in couple hours, which take away the headache in few hours,  She is quite disabled by her dizziness, has to stay seated most of the time, she denies bilateral lower extremity swelling, no significant sensory loss.  UPDATE July 18 2017: MRI of the brain in February 2019, generalized atrophy, especially at the right perisylvian fissure area, mild supratentorium small vessel disease, there is no acute abnormality.  Laboratory evaluation showed A1c of 5.2, normal negative ANA, copper, C-reactive protein, ESR, RPR, B12, CMP, lipid profile, BNP was elevated 246   Significant low vitamin D 5.2,  Echocardiogram showed ejection fraction of 55-60%, wall motion was normal, aortic valve showed no stenosis, ventricular size was normal, systolic function was normal  She is taking gabapentin 670m bid, midodrine 10 mg 3 times a day, last dose was before she goes to bed, she also complains of chronic insomnia, taking Ambien 10 mg at nighttime  She still has depression, anxiety, she has frequent headaches, once or twice each day, she has been 50 mg as needed, 50 mg or Maxalt as needed for headaches, which helps her headache some  UPDATE November 01 2017: She is accompanied by her daughter Mallory Durham today's clinical visit, functional status has much declined, despite midodrine 10 mg 3 times a day, and the Mestinon 60 mg 3 times a day, she complains of lightheadedness, dizziness when getting up just a few minutes, today she had significant orthostatic  pressure dropped ZOXW/960/45 to systolic 60 after standing up, difficult to register diastolic pressure.   She complains lightheadedness fainting sensation after lying down for a few minutes,blood pressure points back to 120/80, her symptoms has much improved  She also complains of frequent migraine headaches, sometimes  woke up with migraine headaches, taking Imitrex as needed  Chronic insomnia, Ambien 5 to 10 mg as needed  UPDATE Sept 9 2019: She is now taking fludrocortisone 0.1 mg 3 times a day, which has really helped her symptoms, along with Mestinon 60 mg daily, she is scheduled to see [facility] on February 07, 2018,  She complains of chronic migraine headaches for many years,3-4 times each week, right lateralized severe pounding headaches, with associated light noise sensitivity, Imitrex 100 mg as needed was helpful, still her headache last about 6 hours,  UPDATE May 02 2018: She is now taking fludrocortisone 0.3 mg 3 times a day, Mestinon 60 mg 3 times a day, midodrine was increased from 2.5 to 5 mg 3 times a day, orthostatic symptoms has much improved,  She has almost daily headache, more on right temporal region, imitrex 115m 1/2 tab daily,   She reported 40% improvement with Botox injection, she has less headache, less severe, but benefit short lasting,  UPDATE Sep 27 2018: She return for Botox injection as migraine prevention, this is the third time she had injection, she did reported at least moderate improvement of her migraine headaches, there was no significant side effect  She complains of excessive fatigue  UPDATE January 03 2019: She reported 50% improvement of her migraine headache, they are less severe, less frequent,   I personally reviewed CT head without contrast on November 09, 2018 that was normal Laboratory evaluations in May 2020 showed low normal B12 262, she is on supplement, Vitamin D was 132 she has normal thyroid panel  UPDATE Apr 17 2019: She complains of worsening headache at the end of Botox injection cycle, has been taking Imitrex on a daily basis over the past few days, continue complains of lack of stamina, increased gait abnormality, small shuffling gait, today's blood pressure sitting down 140/110, standing up 120/90,  [facility] comprehensive  autoimmune dysautonomia panel was negative  She also complains of excessive itching, failed to respond to skin lotion, has to take frequent Benadryl every 3 hours.  REVIEW OF SYSTEMS: Full 14 system review of systems performed and notable only for above  All rest review of system were negative   ASSESSMENT AND PLAN  Mallory Durham a 78y.o. 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   Botox injection for chronic migraine prevention, injection was performed according to [place] protocol,  5 units  of Botox was injected into each side, for 31 injection sites, total of 155 units  Bilateral frontalis 4 injection sites Bilateral corrugate 2 injection sites Procerus 1 injection sites. Bilateral temporalis 8 injection sites Bilateral occipitalis 6 injection sites Bilateral cervical paraspinals 4 injection sites Bilateral upper trapezius 6 injection sites  Extra 45 unites were injected into bilateral masseters muscle and right temporoparietal region    Marcial Pacas, M.D. Ph.D.  [facility]  [address] [place], [place] [address] Ph: [phone] Fax: [phone]  CC: Fortino Sic, [place]

## 2019-04-17 NOTE — Progress Notes (Signed)
**  Botox 200 units x 1 vial, NDC 0023-3921-02, Lot [id number], Exp 09/2021, office supply.//mck,rn** 

## 2019-04-20 MED FILL — NORTRIPTYLINE HCL 10 MG CAP: 10 | 30 days supply | Qty: 60 | Fill #3

## 2019-05-01 ENCOUNTER — Ambulatory Visit: Payer: Medicare Other | Admitting: Physician Assistant

## 2019-05-14 ENCOUNTER — Other Ambulatory Visit: Payer: Self-pay | Admitting: Neurology

## 2019-05-14 MED FILL — SUMATRIPTAN SUCC 100 MG TAB: 100 | 30 days supply | Qty: 12 | Fill #9

## 2019-05-14 MED FILL — FLUDROCORTISONE 0.1 MG TAB: 0.1 | 30 days supply | Qty: 90 | Fill #6

## 2019-05-14 NOTE — Progress Notes (Signed)
Cardiology Office Note:    Date:  05/15/2019   ID:  Mallory Durham, DOB July 14, 1940, MRN [id number]  PCP:  Fortino Sic, PA  Cardiologist:  Mertie Moores, MD   Electrophysiologist:  None   Referring MD: Fortino Sic, *   Chief Complaint  Patient presents with  . Follow-up    orthostatic hypotension    History of Present Illness:    Mallory Durham is a 78 y.o. female with:   Dysautonomia; orthostatic hypotension  Hypothyroidism  Hypokalemia   Abnormal EKG  Mallory Durham was last seen via Telemedicine in 10/2018.    She returns for follow-up.  She is here with her daughter.  She uses a walker.  She has not had any further symptoms of near syncope with standing.  However, she does stand for prolonged periods of time, she will eventually become near syncopal.  She has had one episode of syncope.  This was several months ago.  It occurred after prolonged standing and after having a bowel movement.  She has not had chest discomfort.  She does feel short of breath when she becomes weak.  She mainly feels significantly fatigued with prolonged standing.  She is unable to wear compression stockings.  She did take her fludrocortisone doses too close together this morning because she was worried about getting a late dose and having recurrent symptoms.    Prior CV studies:   The following studies were reviewed today:  Echocardiogram 07/01/2017 EF 55-60, normal wall motion, grade 2 diastolic dysfunction, mild AI, normal RV SF, PASP 28  Past Medical History:  Diagnosis Date  . Anxiety   . Confusion, hx of, without neuro findings   . Depression   . Hx of vertigo   . Hyperlipidemia   . Hypothyroid   . Left shoulder pain   . Migraines   . Neck pain   . Orthostatic hypotension   . Renal insufficiency    Surgical Hx: The patient  has a past surgical history that includes Joint replacement; Abdominal hysterectomy; Thyroid surgery; Cholecystectomy; Tonsillectomy;  Abdominal hysterectomy; and Breast lumpectomy.   Current Medications: Current Meds  Medication Sig  . albuterol (PROVENTIL HFA;VENTOLIN HFA) 108 (90 Base) MCG/ACT inhaler Inhale 2 puffs into the lungs every 6 (six) hours as needed for wheezing or shortness of breath.  Marland Kitchen aspirin-acetaminophen-caffeine (EXCEDRIN MIGRAINE) 250-250-65 MG tablet Take by mouth every 6 (six) hours as needed for headache.  . botulinum toxin Type A (BOTOX) 100 units SOLR injection Inject IM every 3 months by MD in the office  . buPROPion (WELLBUTRIN XL) 150 MG 24 hr tablet Take 1 tablet (150 mg total) by mouth daily.  . Cholecalciferol (D3-1000 PO) Take 1 tablet by mouth daily.  . fludrocortisone (FLORINEF) 0.1 MG tablet TAKE 1 TABLET (0.1 MG TOTAL) BY MOUTH 3 (THREE) TIMES DAILY.  . fluticasone (FLONASE) 50 MCG/ACT nasal spray Place into both nostrils daily as needed for allergies or rhinitis.  . fluticasone (FLOVENT HFA) 110 MCG/ACT inhaler Inhale 2 puffs into the lungs 2 (two) times daily as needed [facility]).  Marland Kitchen gabapentin (NEURONTIN) 100 MG capsule Take 1 capsule (100 mg total) by mouth 3 (three) times daily.  . midodrine (PROAMATINE) 10 MG tablet Take 1 tablet (10 mg total) by mouth 3 (three) times daily.  . Multiple Vitamins-Minerals (PRESERVISION AREDS 2) CAPS Take 2 capsules by mouth.  . nortriptyline (PAMELOR) 10 MG capsule TAKE 2 CAPSULES (20 MG TOTAL) BY MOUTH AT BEDTIME.  Marland Kitchen ondansetron (ZOFRAN) 4  MG tablet TAKE 1 TABLET (4 MG TOTAL) BY MOUTH EVERY 8 (EIGHT) HOURS AS NEEDED FOR NAUSEA OR VOMITING.  . phenazopyridine (PYRIDIUM) 100 MG tablet Take 100 mg by mouth 3 (three) times daily as needed for pain.  . potassium chloride (K-DUR) 10 MEQ tablet TAKE 2 TABLETS BY MOUTH 2 TIMES A DAY  . Probiotic Product (PROBIOTIC PO) Take 1 capsule by mouth as directed.  . propranolol ER (INDERAL LA) 60 MG 24 hr capsule Take 1 capsule (60 mg total) by mouth at bedtime.  . sertraline (ZOLOFT) 100 MG tablet Take 100 mg by  mouth daily.  . simvastatin (ZOCOR) 20 MG tablet Take 1 tablet (20 mg total) by mouth every evening.  . SUMAtriptan (IMITREX) 100 MG tablet Take 1 tab at onset of migraine.  May repeat in 2 hrs, if needed.  Max dose: 2 tabs/day. This is a 30 day prescription.  Marland Kitchen SYNTHROID 100 MCG tablet TAKE 1 TABLET (100 MCG TOTAL) BY MOUTH DAILY BEFORE BREAKFAST.  Marland Kitchen tiZANidine (ZANAFLEX) 2 MG tablet Take 1 tablet (2 mg total) by mouth every 8 (eight) hours as needed for muscle spasms.  . vitamin B-12 (CYANOCOBALAMIN) 1000 MCG tablet Take 1,000 mcg by mouth daily.  Marland Kitchen zolpidem (AMBIEN) 10 MG tablet TAKE 1/2-1 TABLET (5-10 MG TOTAL) BY MOUTH AT BEDTIME AS NEEDED.     Allergies:   Patient has no known allergies.   Social History   Tobacco Use  . Smoking status: Never Smoker  . Smokeless tobacco: Never Used  Substance Use Topics  . Alcohol use: No  . Drug use: No     Family Hx: The patient's family history includes Cancer in her father, maternal aunt, and mother; Heart disease in her father and mother.  ROS:   Please see the history of present illness.    Review of Systems  Gastrointestinal: Negative for hematochezia and melena.  Genitourinary: Negative for hematuria.   All other systems reviewed and are negative.   EKGs/Labs/Other Test Reviewed:    EKG:  EKG is  ordered today.  The ekg ordered today demonstrates normal sinus rhythm, heart rate 75, PACs, normal axis, nonspecific ST-T wave changes, QTC 498, similar to prior tracing  Recent Labs: 09/05/2018: BUN 14; Creatinine, Ser 1.04; Potassium 4.4; Sodium 145 11/09/2018: TSH 1.60   Recent Lipid Panel Lab Results  Component Value Date/Time   CHOL 151 10/03/2013 12:00 AM   TRIG 100 10/03/2013 12:00 AM   HDL 61 10/03/2013 12:00 AM   LDLCALC 70 10/03/2013 12:00 AM    Physical Exam:    VS:  BP (!) 148/90 Comment: Automatic 156/95  Pulse 75   Ht 5\' 7"  (1.702 m)   Wt 204 lb (92.5 kg)   BMI 31.95 kg/m     Wt Readings from Last 3  Encounters:  05/15/19 204 lb (92.5 kg)  04/17/19 203 lb 8 oz (92.3 kg)  01/03/19 201 lb 8 oz (91.4 kg)     Physical Exam  Constitutional: She is oriented to person, place, and time. She appears well-developed and well-nourished. No distress.  HENT:  Head: Normocephalic and atraumatic.  Eyes: No scleral icterus.  Neck: No thyromegaly present.  Cardiovascular: Normal rate and regular rhythm.  No murmur heard. Pulmonary/Chest: Effort normal. She has no rales.  Abdominal: Soft.  Musculoskeletal:        General: No edema.  Lymphadenopathy:    She has no cervical adenopathy.  Neurological: She is alert and oriented to person, place, and time.  Skin: Skin is warm and dry.  Psychiatric: She has a normal mood and affect.    ASSESSMENT & PLAN:    1. Orthostatic hypotension Symptoms of orthostatic hypotension remain improved.  She still has issues with fatigue after prolonged standing.  These symptoms are fairly chronic but she notes some worsening recently.  She has rare episodes of syncope if she stands for a very long time.  She cannot find compression stockings that fit her appropriately.  She did take her Fludrocortisone doses close together today.  Her BP is running high.  She has a hard time finding a large BP cuff.  We will try to provide her with one. If her BP continues to run high, we will need to cut back on one of her medications for orthostatic hypotension.    2. Other fatigue Etiology not clear.  Her orthostatic intolerance is likely playing some of a role.  But, since her symptoms are improved, I would expect her fatigue to improve.  A recent TSH was normal.  She is on B12 but had no improved symptoms with this.  I will check a CBC and Iron panel.  If this is normal, she should follow up with her PCP for evaluation of other causes.     Dispo:  Return in about 6 months (around 11/13/2019) for Routine Follow Up, w/ Dr. Elease HashimotoNahser, or Tereso NewcomerScott Bianna Haran, PA-C, (virtual or in-person).    Medication Adjustments/Labs and Tests Ordered: Current medicines are reviewed at length with the patient today.  Concerns regarding medicines are outlined above.  Tests Ordered: Orders Placed This Encounter  Procedures  . Basic metabolic panel  . CBC  . Iron, TIBC and Ferritin Panel  . EKG 12/Charge capture   Medication Changes: No orders of the defined types were placed in this encounter.   Signed, Tereso NewcomerScott Ranald Alessio, PA-C  05/15/2019 12:42 PM    [facility] [address], [place], [place]  [address] Phone: [phone]; Fax: [phone]

## 2019-05-15 ENCOUNTER — Other Ambulatory Visit: Payer: Self-pay

## 2019-05-15 ENCOUNTER — Ambulatory Visit (INDEPENDENT_AMBULATORY_CARE_PROVIDER_SITE_OTHER): Payer: Medicare Other | Admitting: Physician Assistant

## 2019-05-15 ENCOUNTER — Encounter: Payer: Self-pay | Admitting: Physician Assistant

## 2019-05-15 VITALS — BP 148/90 | HR 75 | Ht 67.0 in | Wt 204.0 lb

## 2019-05-15 DIAGNOSIS — R5383 Other fatigue: Secondary | ICD-10-CM | POA: Diagnosis not present

## 2019-05-15 DIAGNOSIS — I951 Orthostatic hypotension: Secondary | ICD-10-CM

## 2019-05-15 NOTE — Patient Instructions (Addendum)
Medication Instructions:  Your physician recommends that you continue on your current medications as directed. Please refer to the Current Medication list given to you today.  *If you need a refill on your cardiac medications before your next appointment, please call your pharmacy*  Lab Work: TODAY:  BMET, CBC, & IRON PANEL  If you have labs (blood work) drawn today and your tests are completely normal, you will receive your results only by: Marland Kitchen MyChart Message (if you have MyChart) OR . A paper copy in the mail If you have any lab test that is abnormal or we need to change your treatment, we will call you to review the results.  Testing/Procedures: None ordered  Follow-Up: At [facility], you and your health needs are our priority.  As part of our continuing mission to provide you with exceptional heart care, we have created designated Provider Care Teams.  These Care Teams include your primary Cardiologist (physician) and Advanced Practice Providers (APPs -  Physician Assistants and Nurse Practitioners) who all work together to provide you with the care you need, when you need it.  Your next appointment:   6 month(s)  The format for your next appointment:   In Person  Provider:   You may see Mertie Moores, MD or one of the following Advanced Practice Providers on your designated Care Team:    Richardson Dopp, PA-C  [place], [place]  Daune Perch, NP   Other Instructions  Monitor your blood pressure.  Call if your blood pressure is consistently running over 130/80

## 2019-05-16 ENCOUNTER — Telehealth: Payer: Self-pay | Admitting: Licensed Clinical Social Worker

## 2019-05-16 ENCOUNTER — Telehealth: Payer: Self-pay

## 2019-05-16 DIAGNOSIS — E876 Hypokalemia: Secondary | ICD-10-CM

## 2019-05-16 LAB — BASIC METABOLIC PANEL
BUN/Creatinine Ratio: 10 — ABNORMAL LOW (ref 12–28)
BUN: 10 mg/dL (ref 8–27)
CO2: 28 mmol/L (ref 20–29)
Calcium: 8.8 mg/dL (ref 8.7–10.3)
Chloride: 96 mmol/L (ref 96–106)
Creatinine, Ser: 1.01 mg/dL — ABNORMAL HIGH (ref 0.57–1.00)
GFR calc Af Amer: 62 mL/min/{1.73_m2} (ref >59)
GFR calc non Af Amer: 53 mL/min/{1.73_m2} — ABNORMAL LOW (ref >59)
Glucose: 75 mg/dL (ref 65–99)
Potassium: 3.5 mmol/L (ref 3.5–5.2)
Sodium: 141 mmol/L (ref 134–144)

## 2019-05-16 LAB — CBC
Hematocrit: 38.6 % (ref 34.0–46.6)
Hemoglobin: 13.1 g/dL (ref 11.1–15.9)
MCH: 30 pg (ref 26.6–33.0)
MCHC: 33.9 g/dL (ref 31.5–35.7)
MCV: 89 fL (ref 79–97)
Platelets: 307 10*3/uL (ref 150–450)
RBC: 4.36 x10E6/uL (ref 3.77–5.28)
RDW: 13 % (ref 11.7–15.4)
WBC: 9.2 10*3/uL (ref 3.4–10.8)

## 2019-05-16 LAB — IRON,TIBC AND FERRITIN PANEL
Ferritin: 17 ng/mL (ref 15–150)
Iron Saturation: 21 % (ref 15–55)
Iron: 71 ug/dL (ref 27–139)
Total Iron Binding Capacity: 339 ug/dL (ref 250–450)
UIBC: 268 ug/dL (ref 118–369)

## 2019-05-16 MED FILL — ZOLPIDEM TARTRATE 10 MG TAB: 10 | 30 days supply | Qty: 30 | Fill #0

## 2019-05-16 NOTE — Telephone Encounter (Signed)
CSW referred to assist patient with obtaining a BP cuff. CSW contacted patient to inform cuff will be delivered to home. Patient grateful for support and assistance. CSW available as needed. Jackie Romualdo Prosise, LCSW, CCSW-MCS [phone]  

## 2019-05-16 NOTE — Telephone Encounter (Signed)
I called and spoke with patient about lab results. She stated that she was only taking her potassium 1 tablet twice a day, not 2 tablets twice a day as listed on her medication list. Advised patient to take 2 tablets twice a day and come back for repeat BMET on 06/01/19.

## 2019-05-17 NOTE — Telephone Encounter (Signed)
Agree Richardson Dopp, PA-C    05/17/2019 11:27 AM

## 2019-05-31 ENCOUNTER — Ambulatory Visit: Payer: PPO | Attending: Neurology | Admitting: Physical Therapy

## 2019-05-31 ENCOUNTER — Other Ambulatory Visit: Payer: Self-pay

## 2019-05-31 VITALS — BP 110/80

## 2019-05-31 DIAGNOSIS — Z9181 History of falling: Secondary | ICD-10-CM

## 2019-05-31 DIAGNOSIS — R2689 Other abnormalities of gait and mobility: Secondary | ICD-10-CM | POA: Diagnosis not present

## 2019-05-31 DIAGNOSIS — R2681 Unsteadiness on feet: Secondary | ICD-10-CM

## 2019-05-31 DIAGNOSIS — M6281 Muscle weakness (generalized): Secondary | ICD-10-CM

## 2019-05-31 NOTE — Therapy (Signed)
[facility] [address] [place], [place], [address] Phone: [phone]   Fax:  [phone]  Physical Therapy Evaluation  Patient Details  Name: Mallory Durham MRN: [id number] Date of Birth: 24-Jun-1977 Referring Provider (PT): Levert Feinstein, MD   Encounter Date: 05/31/2019  PT End of Session - 05/31/19 1143    Visit Number  1    Number of Visits  17    Date for PT Re-Evaluation  08/29/19    Authorization Type  [employer] - PPO    PT Start Time  1017    PT Stop Time  1108    PT Time Calculation (min)  51 min    Equipment Utilized During Treatment  Gait belt    Activity Tolerance  Patient limited by fatigue;Other (comment)   limited by orthostatic hypotension   Behavior During Therapy  [facility] for tasks assessed/performed       Past Medical History:  Diagnosis Date  . Anxiety   . Confusion, hx of, without neuro findings   . Depression   . Hx of vertigo   . Hyperlipidemia   . Hypothyroid   . Left shoulder pain   . Migraines   . Neck pain   . Orthostatic hypotension   . Renal insufficiency     Past Surgical History:  Procedure Laterality Date  . ABDOMINAL HYSTERECTOMY    . ABDOMINAL HYSTERECTOMY    . BREAST LUMPECTOMY    . CHOLECYSTECTOMY    . JOINT REPLACEMENT    . THYROID SURGERY    . TONSILLECTOMY      Vitals:   05/31/19 1055  BP: 110/80     Subjective Assessment - 05/31/19 1022    Subjective  "I'm here because Dr. Terrace Arabia wanted me to and because my son-in-law has an appoitnment at the same time." Walking back to the treatment room from the waiting room was difficult because of feeling tired all of the time. Even getting ready this morning was difficult. Not having episodes of feeling lightheadedness (used to be able "feel blood dropping from her face and felt almost syncopal"). Now taking 3 BP medications to keep it up. Doesn't go shopping in the store anymore. The last couple of years have been  difficult - her husband is in a nursing home. Gets Botox from Dr. Terrace Arabia from migraine headaches. Has recently been doing well, but is now getting up to do activities for approx. 5 minutes before needing to sit down before feeling fatigued. Ocassionally still has those low BP episodes. Last time she passed out was before Christmas. Have been measured before for both legs for compression stockings - has not bought any because legs are of different sizes. Feels her walking in fine with a rollator, and without an AD in the house notices smaller steps and going from side to side.    Pertinent History  PMH: orthostatic hypotension, hypothyroidism, depression, HLD, chronic migraine, hx of L knee replacement.    How long can you stand comfortably?  approx. 5 minutes    How long can you walk comfortably?  approx. 2 minutes.    Patient Stated Goals  not really sure, maybe to work on walking.    Currently in Pain?  No/denies         [facility] PT Assessment - 05/31/19 1037      Assessment   Medical Diagnosis  gait abmormality/orthostatic hypotension    Referring Provider (PT)  Levert Feinstein, MD    Onset Date/Surgical  Date  05/30/14   approx. syncopal episodes, at least 1 yr ago for gait   Hand Dominance  Right    Prior Therapy  yes prior for knee replacement      Precautions   Precautions  Fall    Precaution Comments  orthostatic hypotension      Balance Screen   Has the patient fallen in the past 6 months  Yes    How many times?  --   almost fell this morning bending over   Has the patient had a decrease in activity level because of a fear of falling?   No    Is the patient reluctant to leave their home because of a fear of falling?   No   always has the [place];Other (Comment)   son in law   Available Help at Discharge  Family    Type of [place] Access  Level entry    [place] to  live on main level with bedroom/bathroom    Home Equipment  Other (comment);Grab bars - toilet;Grab bars - tub/shower;Tub bench   rollator,    Additional Comments  grandson washes her clothes      Prior Function   Level of Independence  Independent;Other (comment)   was able to cook, clean, and do laundry prior    Leisure  likes to read, crochet, play games       Cognition   Overall Cognitive Status  Within Functional Limits for tasks assessed      Observation/Other Assessments   Observations  LLE more swollen than the R (ever since after knee replacement)      Coordination   Gross Motor Movements are Fluid and Coordinated  No    Heel Shin Test  limited on LLE secondary to weakness      Special Tests   Other special tests  Unable to assess orthostatics - was not able to hear BP reading with manual cuff while standing (per pt and pt's chart this has been common in the past at MD's offices). pt unable to stand >1 minute before feeling faint.      Transfers   Transfers  Sit to Stand;Stand to Sit    Sit to Stand  5: Supervision;From chair/3-in-1;With upper extremity assist    Stand to Sit  5: Supervision;With upper extremity assist;To chair/3-in-1;4: Min guard    Stand to Sit Details  pt needing to sit down approx. 1 minute after standing when attempting to take BP due to feeling faint, min guard for descent back to sitting in chair     Comments  bending over to pick up objects. sometimes needs help from daughter to get up        Ambulation/Gait   Ambulation/Gait  Yes    Ambulation/Gait Assistance  5: Supervision;4: Min guard    Ambulation/Gait Assistance Details  into and out of waiting room with rollator - pt reporting increased fatigue and needing to sit down after performing     Ambulation Distance (Feet)  50 Feet   -60 approx.    Assistive device  Rollator    Gait Pattern  Step-through pattern;Trunk flexed;Poor foot clearance - left;Poor foot clearance - right    Ambulation  Surface  Level;Indoor                Objective  measurements completed on examination: See above findings.              PT Education - 05/31/19 1135    Education Details  clinical findings, POC, therapist messaging pt's cardiologist/neurologist regarding proper compression for stockings    Person(s) Educated  Patient    Methods  Explanation    Comprehension  Verbalized understanding       PT Short Term Goals - 05/31/19 1459      PT SHORT TERM GOAL #1   Title  Patient will be independent with HEP in order to build upon functional gains made in PT. ALL STGS DUE 07/12/19    Time  6   due to delay in scheduling   Period  Weeks    Status  New    Target Date  07/12/19      PT SHORT TERM GOAL #2   Title  Patient will be able to tolerate standing at least 3 minutes in order to improve tolerance for ADLs.    Baseline  unable to stand >1 minute at eval today due to orthostatics (not wearing compression stockings).    Time  6    Period  Weeks    Status  New      PT SHORT TERM GOAL #3   Title  Patient will undergo further assessment of gait speed with rollator when appropriate to determine fall risk - LTG written as appropriate.    Baseline  6    Period  Weeks    Status  New      PT SHORT TERM GOAL #4   Title  Patient will undergo further assessment of TUG using rollator in order to determine fall risk. -LTG written as appropriate.    Time  6    Period  Weeks    Status  New      PT SHORT TERM GOAL #5   Title  Patient will undergo further assessment of with rollator to determine endurance - LTG written as appropriate.    Time  6    Period  Weeks    Status  New        PT Long Term Goals - 05/31/19 1505      PT LONG TERM GOAL #1   Title  Patient will be independent with final HEP in order to build upon functional gains made in PT. ALL LTGS DUE 08/09/19    Time  10   due to delay in scheduling   Period  Weeks    Status  New    Target Date  08/09/19       PT LONG TERM GOAL #2   Title  Gait speed goal with rollator written as appropriate.    Baseline  not yet assessed.    Time  10    Period  Weeks    Status  New      PT LONG TERM GOAL #3   Title  TUG goal written as appropriate in order to determine fall risk.    Baseline  not yet assessed.    Time  10    Period  Weeks      PT LONG TERM GOAL #4   Title  goal to be written as appropriate in order to determine endurance.    Baseline  not yet assessed.    Time  10    Period  Weeks    Status  New      PT LONG TERM GOAL #5  Title  Patient will ambulate at least 200' over level indoor/unlevel outdoor surfaces with rollator and supervision in order to improve community mobility.    Baseline  fatigues with 50-60'    Time  10    Period  Weeks    Status  New             Plan - 05/31/19 1145    Clinical Impression Statement  Patient is a 79 year old female referred to Neuro OPPT for evaluation with primary concern of gait abnormality, balance impairment, orthostatic hypotension.  Pt's PMH is significant for: orthostatic hypotension, hypothyroidism, depression, HLD, chronic migraine. Pt has had syncopal/almost syncopal episodes since 2016. They have recently gotten better - last time pt fainted was before Christmas. Recently noticing a more shuffled gait when not using an AD in her house for short distances. Per Dr. Zannie CoveYan's note on 04/17/19 - [facility] comprehensive autoimmune dysautonomia panel was negative - no treatable etiology found for orthostatic hypotension (most consistent with central nervous system degenerative disorder). Pt not wearing compression stockings due to not being able to find a proper fit. Therapist to send message to pt's cardiologist/neurologist regarding proper compression for stockings. The following deficits were present during the exam: decreased endurance, impaired balance, unable to perform static standing >45 seconds-1 minute due to orthostatics,  decreased functional LE strength, gait abnormalities. Pt unable to stand for 45-60 seconds before needing to sit down due to feeling faint - unable to hear BP using manual cuff in standing. Pt would benefit from skilled PT to address these impairments and functional limitations to maximize functional mobility independence and decrease fall risk.    Personal Factors and Comorbidities  Comorbidity 3+;Past/Current Experience;Time since onset of injury/illness/exacerbation;Transportation    Comorbidities  PMH: orthostatic hypotension, hypothyroidism, depression, HLD, chronic migraine    Examination-Activity Limitations  Stand;Locomotion Level;Bend;Squat;Transfers    Examination-Participation Restrictions  Community Activity;Meal Prep;Cleaning;Shop;Laundry    Stability/Clinical Decision Making  Unstable/Unpredictable    Clinical Decision Making  High    Rehab Potential  Fair    PT Frequency  1x / week   1-2x per week   PT Duration  8 weeks    PT Treatment/Interventions  ADLs/Self Care Home Management;Aquatic Therapy;DME Instruction;Gait training;Functional mobility training;Therapeutic activities;Therapeutic exercise;Patient/family education;Neuromuscular re-education;Balance training;Energy conservation;Vestibular;Compression bandaging    PT Next Visit Plan  ALWAYS CHECK BP MANUALLY ON LUE. (could not hear reading in standing). measure pt's BLE for size for compression stockings (both are different sizes), pt should be bringing in automatic cuff from home - educate how to use/calibrate if needed, abdominal binder?, initial supine/seated HEP, check gait speed, TUG, 2MWT and revise LTGs as needed (unable to assess at eval)    Recommended Other Services  aquatic therapy?    Consulted and Agree with Plan of Care  Patient       Patient will benefit from skilled therapeutic intervention in order to improve the following deficits and impairments:  Abnormal gait, Decreased activity tolerance, Decreased  balance, Decreased endurance, Decreased coordination, Difficulty walking, Decreased strength, Dizziness, Increased edema  Visit Diagnosis: Unsteadiness on feet  History of falling  Other abnormalities of gait and mobility  Muscle weakness (generalized)     Problem List Patient Active Problem List   Diagnosis Date Noted  . Gait abnormality 04/17/2019  . Itching 04/17/2019  . Vitamin D deficiency 09/27/2018  . Fatigue 09/27/2018  . Chronic migraine 11/01/2017  . Chronic insomnia 11/01/2017  . Peripheral polyneuropathy 08/05/2017  . Memory loss 07/19/2017  .  Autonomic failure 06/01/2017  . 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 Dizziness 04/26/2012  . Encounter for therapeutic drug monitoring 04/18/2012  . Unspecified psychosis 04/18/2012  . Osteoarthrosis, unspecified whether generalized or localized, shoulder region 04/18/2012  . Pain in limb 04/18/2012  . Cervical spondylosis without myelopathy 04/18/2012  . Orthostatic hypotension 03/01/2012  . Hyperlipidemia 03/01/2012  . 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 Seasonal and perennial allergic rhinitis 10/02/2008  . Chronic bronchitis (HCC) 10/02/2008  . HEADACHE, CHRONIC 10/02/2008    Drake Leach, PT, DPT  05/31/2019, 3:46 PM  [facility] [address] [place], [place], [address] Phone: [phone]   Fax:  [phone]  Name: Maddison Kilner MRN: [id number] Date of Birth: Jul 03, 1940

## 2019-06-01 ENCOUNTER — Other Ambulatory Visit: Payer: PPO | Admitting: *Deleted

## 2019-06-01 DIAGNOSIS — E876 Hypokalemia: Secondary | ICD-10-CM | POA: Diagnosis not present

## 2019-06-01 LAB — BASIC METABOLIC PANEL
BUN/Creatinine Ratio: 11 — ABNORMAL LOW (ref 12–28)
BUN: 11 mg/dL (ref 8–27)
CO2: 24 mmol/L (ref 20–29)
Calcium: 9.2 mg/dL (ref 8.7–10.3)
Chloride: 102 mmol/L (ref 96–106)
Creatinine, Ser: 1.01 mg/dL — ABNORMAL HIGH (ref 0.57–1.00)
GFR calc Af Amer: 62 mL/min/{1.73_m2} (ref >59)
GFR calc non Af Amer: 53 mL/min/{1.73_m2} — ABNORMAL LOW (ref >59)
Glucose: 116 mg/dL — ABNORMAL HIGH (ref 65–99)
Potassium: 3.2 mmol/L — ABNORMAL LOW (ref 3.5–5.2)
Sodium: 141 mmol/L (ref 134–144)

## 2019-06-01 MED FILL — POTASSIUM CL ER 10 MEQ TAB: 10 | 30 days supply | Qty: 120 | Fill #2

## 2019-06-01 MED FILL — MIDODRINE HCL 10 MG TABS: 10 | 90 days supply | Qty: 270 | Fill #2

## 2019-06-06 DIAGNOSIS — F4323 Adjustment disorder with mixed anxiety and depressed mood: Secondary | ICD-10-CM | POA: Diagnosis not present

## 2019-06-06 DIAGNOSIS — E782 Mixed hyperlipidemia: Secondary | ICD-10-CM | POA: Diagnosis not present

## 2019-06-06 DIAGNOSIS — J42 Unspecified chronic bronchitis: Secondary | ICD-10-CM | POA: Diagnosis not present

## 2019-06-06 DIAGNOSIS — E890 Postprocedural hypothyroidism: Secondary | ICD-10-CM | POA: Diagnosis not present

## 2019-06-06 MED FILL — buPROPion HCL ER (XL) 150 M: 150 | 30 days supply | Qty: 30 | Fill #0

## 2019-06-06 MED FILL — SERTRALINE HCL 100 MG TABS: 100 | 20 days supply | Qty: 30 | Fill #0

## 2019-06-08 ENCOUNTER — Telehealth: Payer: Self-pay | Admitting: Cardiovascular Disease

## 2019-06-08 ENCOUNTER — Other Ambulatory Visit: Payer: Self-pay | Admitting: Neurology

## 2019-06-08 MED ORDER — POTASSIUM CHLORIDE ER 10 MEQ PO TBCR: 40.0000 meq | EXTENDED_RELEASE_TABLET | Freq: Two times a day (BID) | ORAL | Status: DC

## 2019-06-08 MED FILL — NORTRIPTYLINE HCL 10 MG CAP: 10 | 30 days supply | Qty: 60 | Fill #4

## 2019-06-08 MED FILL — GABAPENTIN 100 MG CAPSULE: 100 | 30 days supply | Qty: 90 | Fill #1

## 2019-06-08 MED FILL — SUMATRIPTAN SUCC 100 MG TAB: 100 | 30 days supply | Qty: 12 | Fill #10

## 2019-06-08 MED FILL — FLUDROCORTISONE 0.1 MG TAB: 0.1 | 30 days supply | Qty: 90 | Fill #0

## 2019-06-08 NOTE — Telephone Encounter (Signed)
-----   Message from Beatrice Lecher, [place] sent at 06/03/2019  3:49 PM EST ----- Creatinine stable.  K+ low.   PLAN:   - Confirm that patient has been taking K+ 20 mEq twice daily   - If yes, increase K+ to 40 mEq twice daily   - If no, let me know what dose she has been taking. Tereso Newcomer, PA-C    06/03/2019 3:46 PM

## 2019-06-08 NOTE — Telephone Encounter (Signed)
New message  Patient is calling in to get lab results. Please give patient a call back to discuss.

## 2019-06-08 NOTE — Telephone Encounter (Signed)
Pt returned call from 06/05/19 for her results. I went over results the pt by phone with verbal understanding. I confirmed with the pt that she is taking 20 meq BID of K+. Pt has 10 meq tablets on hand as she states the 20 meq tabs are too big to swallow. I advised the pt of recommendations per Tereso Newcomer, PAC to increase K+ to 40 meq BID. I explained to her this means she will need to take 4 tabs in the AM and 4 tabs in the PM in order to = the increased dosage of 40 meq BID. I advised the pt since we are increasing the dosage she will run out faster and for her to call the pharmacy for a refill when she is down to her last week. Pt thanked me for talking to her today and going over her results.

## 2019-06-12 MED FILL — SYNTHROID 100 MCG TABLET: 100 | 45 days supply | Qty: 45 | Fill #1

## 2019-06-14 ENCOUNTER — Other Ambulatory Visit: Payer: Self-pay

## 2019-06-14 ENCOUNTER — Ambulatory Visit: Payer: PPO | Admitting: Physical Therapy

## 2019-06-14 DIAGNOSIS — R2681 Unsteadiness on feet: Secondary | ICD-10-CM | POA: Diagnosis not present

## 2019-06-14 DIAGNOSIS — R2689 Other abnormalities of gait and mobility: Secondary | ICD-10-CM

## 2019-06-15 ENCOUNTER — Telehealth: Payer: Self-pay

## 2019-06-15 DIAGNOSIS — E876 Hypokalemia: Secondary | ICD-10-CM

## 2019-06-15 MED FILL — ZOLPIDEM TARTRATE 10 MG TAB: 10 | 30 days supply | Qty: 30 | Fill #1

## 2019-06-15 NOTE — Telephone Encounter (Signed)
-----   Message from Beatrice Lecher, [place] sent at 06/14/2019  7:52 AM EST ----- She will need a repeat BMET after increasing K+.  Please arrange a BMET in 10-14 days (Dx: Hypokalemia).  Tereso Newcomer, PA-C    06/14/2019 7:51 AM

## 2019-06-15 NOTE — Therapy (Signed)
[facility] [address] [place], [place], [address] Phone: [phone]   Fax:  [phone]  Physical Therapy Treatment  Patient Details  Name: Mallory Durham MRN: [id number] Date of Birth: [birth year] Referring Provider (PT): Levert Feinstein, MD   Encounter Date: 06/14/2019  PT End of Session - 06/15/19 1416    Visit Number  2    Number of Visits  17    Date for PT Re-Evaluation  08/29/19    Authorization Type  [employer] - P[employer]    PT Start Time  1750    PT Stop Time  1837    PT Time Calculation (min)  47 min    Equipment Utilized During Treatment  Gait belt    Activity Tolerance  Patient limited by fatigue;Other (comment);Patient tolerated treatment well   limited by orthostatic hypotension   Behavior During Therapy  [facility] for tasks assessed/performed       Past Medical History:  Diagnosis Date  . Anxiety   . Confusion, hx of, without neuro findings   . Depression   . Hx of vertigo   . Hyperlipidemia   . Hypothyroid   . Left shoulder pain   . Migraines   . Neck pain   . Orthostatic hypotension   . Renal insufficiency     Past Surgical History:  Procedure Laterality Date  . ABDOMINAL HYSTERECTOMY    . ABDOMINAL HYSTERECTOMY    . BREAST LUMPECTOMY    . CHOLECYSTECTOMY    . JOINT REPLACEMENT    . THYROID SURGERY    . TONSILLECTOMY      There were no vitals filed for this visit.  Subjective Assessment - 06/15/19 1405    Subjective  Pt states she feels better today than she did at eval a couple of weeks ago; pt inquires if we order the compression stockings for her    Patient is accompained by:  Family member   son in law   Pertinent History  PMH: orthostatic hypotension, hypothyroidism, depression, HLD, chronic migraine, hx of L knee replacement.    How long can you stand comfortably?  approx. 5 minutes    How long can you walk comfortably?  approx. 2 minutes.    Patient Stated Goals  not really  sure, maybe to work on walking.    Currently in Pain?  No/denies                       [facility] Adult PT Treatment/Exercise - 06/15/19 0001      Ambulation/Gait   Ambulation/Gait  Yes    Ambulation/Gait Assistance  5: Supervision    Assistive device  Rollator    Gait Pattern  Step-through pattern;Trunk flexed;Poor foot clearance - left;Poor foot clearance - right    Ambulation Surface  Level;Indoor    Gait velocity  12.97 secs =2.53 ft/sec with rollator     Gait Comments  2" walk test attempted - pt amb. 1" 43 secs with RW 230' (2 laps) ; pt had some moderate dyspnea after amb. this distance      Standardized Balance Assessment   Standardized Balance Assessment  Timed Up and Go Test      Timed Up and Go Test   TUG  Normal TUG    Normal TUG (seconds)  15.6   without rollator:  17.16 with RW     Self Care;  Discussed compression stockings with pt given info received from MD -- "10-20 mmHg  to start; thigh high best but to see If she could tolerate knee high first" Recommended "[facility]" for measuring and ordering stockings but pt states she prefers to find a place in [place] for convenience as That is where she lives -- info was not given to patient as will hold and attempt to gather additional info such as MD order if she goes to a store for fitting & ordering   Pt also given info on devices for assisting in donning compression stockings - pt did take this info with her      PT Education - 06/15/19 1411    Education Details  had information regarding ordering compression stockings - location suggested was "[facility]" but pt states she will have to have someone take her there and she prefers to go to a store in [place] for convenience;  pt was also informed that MD had said 10-20 mmHg to start and that thigh high was best but to seee if she could tolerate knee high first - pt stated that this would mean that she would have to purchase 2 pairs; pt was  disappointed to learn that the stockings could not be ordered thru [facility]    Person(s) Educated  Patient    Methods  Explanation   handout was available but withheld pending additional info to be obtained   Comprehension  Verbalized understanding       PT Short Term Goals - 06/15/19 1423      PT SHORT TERM GOAL #1   Title  Patient will be independent with HEP in order to build upon functional gains made in PT. ALL STGS DUE 07/12/19    Time  6   due to delay in scheduling   Period  Weeks    Status  New    Target Date  07/12/19      PT SHORT TERM GOAL #2   Title  Patient will be able to tolerate standing at least 3 minutes in order to improve tolerance for ADLs.    Baseline  unable to stand >1 minute at eval today due to orthostatics (not wearing compression stockings).    Time  6    Period  Weeks    Status  New      PT SHORT TERM GOAL #3   Title  Patient will undergo further assessment of gait speed with rollator when appropriate to determine fall risk - LTG written as appropriate.    Baseline  6    Period  Weeks    Status  New      PT SHORT TERM GOAL #4   Title  Patient will undergo further assessment of TUG using rollator in order to determine fall risk. -LTG written as appropriate.    Time  6    Period  Weeks    Status  New      PT SHORT TERM GOAL #5   Title  Patient will undergo further assessment of 2MWT with rollator to determine endurance - LTG written as appropriate.    Time  6    Period  Weeks    Status  New        PT Long Term Goals - 06/15/19 1424      PT LONG TERM GOAL #1   Title  Patient will be independent with final HEP in order to build upon functional gains made in PT. ALL LTGS DUE 08/09/19    Time  10  due to delay in scheduling   Period  Weeks    Status  New      PT LONG TERM GOAL #2   Title  Gait speed goal with rollator written as appropriate.    Baseline  not yet assessed.    Time  10    Period  Weeks    Status  New      PT LONG TERM  GOAL #3   Title  TUG goal written as appropriate in order to determine fall risk.    Baseline  not yet assessed.    Time  10    Period  Weeks      PT LONG TERM GOAL #4   Title  goal to be written as appropriate in order to determine endurance.    Baseline  not yet assessed.    Time  10    Period  Weeks    Status  New      PT LONG TERM GOAL #5   Title  Patient will ambulate at least 200' over level indoor/unlevel outdoor surfaces with rollator and supervision in order to improve community mobility.    Baseline  fatigues with 50-60'    Time  10    Period  Weeks    Status  New            Plan - 06/15/19 1416    Clinical Impression Statement  Pt limited in standing tolerance by low BP and feeling of near syncope; pt amb. 1" 43 secs with distance 230' prior to needing to sit down due to c/o fatigue and BP beginning to drop per pr report.  Pt disappointed to learn that compression stockings could not be ordered by our facility ([facility]);  pt declines abdominal binder at this time.  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  Pt's balance is also impacted by "autonomic failure" per her report with orthostatic hypotension and near syncopal episodes.  It was recommended to pt that she be instructed in balance HEP to perform on days that she felt she could tolerate and perform safely.    Personal Factors and Comorbidities  Comorbidity 3+;Past/Current Experience;Time since onset of injury/illness/exacerbation;Transportation    Comorbidities  PMH: orthostatic hypotension, hypothyroidism, depression, HLD, chronic migraine    Examination-Activity Limitations  Stand;Locomotion Level;Bend;Squat;Transfers    Examination-Participation Restrictions  Community Activity;Meal  Prep;Cleaning;Shop;Laundry    Stability/Clinical Decision Making  Unstable/Unpredictable    Rehab Potential  Fair    PT Frequency  1x / week   1-2x per week   PT Duration  8 weeks    PT Treatment/Interventions  ADLs/Self Care Home Management;Aquatic Therapy;DME Instruction;Gait training;Functional mobility training;Therapeutic activities;Therapeutic exercise;Patient/family education;Neuromuscular re-education;Balance training;Energy conservation;Vestibular;Compression bandaging    PT Next Visit Plan  Balance HEP - measure pt's BLE for size for compression stockings (both are different sizes), pt should be bringing in automatic cuff from home - educate how to use/calibrate if needed, abdominal binder?, initial supine/seated HEP, check gait speed, TUG, and revise LTGs as needed (unable to assess at eval)    Consulted and Agree with Plan of Care  Patient       Patient will benefit from skilled therapeutic intervention in order to improve the following deficits and impairments:  Abnormal gait, Decreased activity  tolerance, Decreased balance, Decreased endurance, Decreased coordination, Difficulty walking, Decreased strength, Dizziness, Increased edema  Visit Diagnosis: Unsteadiness on feet  Other abnormalities of gait and mobility     Problem List Patient Active Problem List   Diagnosis Date Noted  . Gait abnormality 04/17/2019  . Itching 04/17/2019  . Vitamin D deficiency 09/27/2018  . Fatigue 09/27/2018  . Chronic migraine 11/01/2017  . Chronic insomnia 11/01/2017  . Peripheral polyneuropathy 08/05/2017  . Memory loss 07/19/2017  . Autonomic failure 06/01/2017  . 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 Dizziness 04/26/2012  . Encounter for therapeutic drug monitoring 04/18/2012  . Unspecified psychosis 04/18/2012  . Osteoarthrosis, unspecified whether generalized or localized, shoulder region 04/18/2012  . Pain in limb 04/18/2012  . Cervical spondylosis without myelopathy  04/18/2012  . Orthostatic hypotension 03/01/2012  . Hyperlipidemia 03/01/2012  . 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 Seasonal and perennial allergic rhinitis 10/02/2008  . Chronic bronchitis (HCC) 10/02/2008  . HEADACHE, CHRONIC 10/02/2008    Kary Kos, PT 06/15/2019, 2:25 PM  [facility] [address] [place], [place], [address] Phone: [phone]   Fax:  [phone]  Name: Mallory Durham MRN: [id number] Date of Birth: [birth year]

## 2019-06-15 NOTE — Telephone Encounter (Signed)
I called and spoke with patient, she will come in on 06/29/19 for repeat BMET, orders in.

## 2019-06-16 ENCOUNTER — Ambulatory Visit: Payer: Medicare Other

## 2019-06-19 ENCOUNTER — Other Ambulatory Visit: Payer: Self-pay

## 2019-06-19 ENCOUNTER — Telehealth: Payer: Self-pay | Admitting: Physical Therapy

## 2019-06-19 ENCOUNTER — Ambulatory Visit: Payer: PPO | Attending: Neurology | Admitting: Physical Therapy

## 2019-06-19 ENCOUNTER — Telehealth: Payer: Self-pay | Admitting: Neurology

## 2019-06-19 DIAGNOSIS — M6281 Muscle weakness (generalized): Secondary | ICD-10-CM | POA: Insufficient documentation

## 2019-06-19 DIAGNOSIS — R2681 Unsteadiness on feet: Secondary | ICD-10-CM | POA: Insufficient documentation

## 2019-06-19 DIAGNOSIS — R2689 Other abnormalities of gait and mobility: Secondary | ICD-10-CM | POA: Insufficient documentation

## 2019-06-19 NOTE — Telephone Encounter (Signed)
Patients daughter Angelique Blonder is requesting a call back to discuss PT order

## 2019-06-19 NOTE — Telephone Encounter (Signed)
I returned the call to the patient's daughter. She was concerned her mother may get limited benefit from PT with her BP fluctuations. It appears they did have a meaningful session today. She will call me back if she feels her mother is not showing improvement.

## 2019-06-19 NOTE — Patient Instructions (Signed)
Butterfly, Supine    Lie on back, feet together. Lower knees toward floor. Hold _5__ seconds. Repeat 10___ times per session. Do _1__ sessions per day.  KEEP ONE LEG STILL - place band around both legs, just above the knees Copyright  VHI. All rights reserved.     Straight Leg Raise    Squeeze pelvic floor and hold. Tighten top of left thigh. Raise leg off bed _10-12__ inches. Hold for _3__ seconds. Relax for _5__ seconds. Repeat _10__ times. Do _1__ times a day. Repeat with other leg.    Copyright  VHI. All rights reserved.    Bridging    Lying supine with knees bent, tighten stomach muscles. Raise hips off floor, tighten buttocks. Hold _3-5__ seconds. Repeat _10__ times. Do _1__ times per day.      BALANCE EXERCISES  Standing Marching   Using a chair if necessary, march in place. Repeat 10 times. Do 1 sessions per day.     Hip Backward Kick   Using a chair for balance, keep legs shoulder width apart and toes pointed for- ward. Slowly extend one leg back, keeping knee straight. Do not lean forward. Repeat with other leg. Repeat 10 times. Do 1 sessions per day.     Hip Side Kick   Holding a chair for balance, keep legs shoulder width apart and toes pointed forward. Swing a leg out to side, keeping knee straight. Do not lean. Repeat using other leg. Repeat 10 times. Do 1 sessions per day.  ALSO DO FORWARD KICKS - 10 REPS EACH LEG - ALTERNATING         Standing On One Leg With SUPPORT - HOLD onto counter as needed .  Stand on one leg in neutral spine without support. Hold 10 seconds. Repeat on other leg. Do 2 repetitions each leg   http://bt.exer.us/36     Side-Stepping   Walk to left side with eyes open. Take even steps, leading with same foot. Make sure each foot lifts off the floor. Repeat in opposite direction. DO 3-4 times along counter with support as needed.

## 2019-06-20 ENCOUNTER — Encounter: Payer: Self-pay | Admitting: Physical Therapy

## 2019-06-20 NOTE — Therapy (Signed)
[facility] [address] [place], [place], [address] Phone: [phone]   Fax:  [phone]  Physical Therapy Treatment  Patient Details  Name: Mallory Durham MRN: [id number] Date of Birth: 17-Feb-1941 Referring Provider (PT): Marcial Pacas, MD   Encounter Date: 06/19/2019  PT End of Session - 06/20/19 1342    Visit Number  3    Number of Visits  17    Date for PT Re-Evaluation  08/29/19    Authorization Type  [employer] - P[employer]    PT Start Time  8546    PT Stop Time  2703    PT Time Calculation (min)  43 min    Activity Tolerance  Patient limited by fatigue;Other (comment);Patient tolerated treatment well   limited by orthostatic hypotension   Behavior During Therapy  [facility] for tasks assessed/performed       Past Medical History:  Diagnosis Date  . Anxiety   . Confusion, hx of, without neuro findings   . Depression   . Hx of vertigo   . Hyperlipidemia   . Hypothyroid   . Left shoulder pain   . Migraines   . Neck pain   . Orthostatic hypotension   . Renal insufficiency     Past Surgical History:  Procedure Laterality Date  . ABDOMINAL HYSTERECTOMY    . ABDOMINAL HYSTERECTOMY    . BREAST LUMPECTOMY    . CHOLECYSTECTOMY    . JOINT REPLACEMENT    . THYROID SURGERY    . TONSILLECTOMY      There were no vitals filed for this visit.  Subjective Assessment - 06/20/19 1329    Subjective  Pt states she is very tired today - doesn't feel as well today as she did at previous session last Thursday    Currently in Pain?  No/denies                       [facility] Adult PT Treatment/Exercise - 06/20/19 0001      Ambulation/Gait   Ambulation/Gait  Yes    Ambulation/Gait Assistance  5: Supervision    Ambulation Distance (Feet)  230 Feet    Assistive device  Rollator    Gait Pattern  Within Functional Limits    Ambulation Surface  Level;Indoor      Self-Care   Self-Care  Other Self-Care  Comments    Other Self-Care Comments   Pt was given info for ordering compression stockings - recommended [facility] as they will measure and order appropriate size for patient; 10-20 mmHg recommended - start knee high; pt was given info on compression stockings donning aids  (from online)       Neuro Re-ed    Neuro Re-ed Details   Pt performed standing balance exercises for HEP - forward, back and side kicks 10 reps each;  marching in place; sidestepping along counter and SLS - with counter for UE support for safety       Exercises   Exercises  Knee/Hip      Knee/Hip Exercises: Supine   Bridges  AROM;Both;1 set;10 reps    Straight Leg Raises  AROM;Left;Right;1 set;10 reps    Other Supine Knee/Hip Exercises  hip abduction with green theraband in hooklying position 10 reps              PT Education - 06/20/19 1342    Education Details  HEP - supine strengthening exs & standing balance exs; see pt instructions  for details    Person(s) Educated  Patient    Methods  Explanation;Demonstration;Handout    Comprehension  Verbalized understanding;Returned demonstration       PT Short Term Goals - 06/20/19 1346      PT SHORT TERM GOAL #1   Title  Patient will be independent with HEP in order to build upon functional gains made in PT. ALL STGS DUE 07/12/19    Time  6   due to delay in scheduling   Period  Weeks    Status  New    Target Date  07/12/19      PT SHORT TERM GOAL #2   Title  Patient will be able to tolerate standing at least 3 minutes in order to improve tolerance for ADLs.    Baseline  unable to stand >1 minute at eval today due to orthostatics (not wearing compression stockings).    Time  6    Period  Weeks    Status  New      PT SHORT TERM GOAL #3   Title  Patient will undergo further assessment of gait speed with rollator when appropriate to determine fall risk - LTG written as appropriate.    Baseline  6    Period  Weeks    Status  New      PT SHORT  TERM GOAL #4   Title  Patient will undergo further assessment of TUG using rollator in order to determine fall risk. -LTG written as appropriate.    Time  6    Period  Weeks    Status  New      PT SHORT TERM GOAL #5   Title  Patient will undergo further assessment of with rollator to determine endurance - LTG written as appropriate.    Time  6    Period  Weeks    Status  New        PT Long Term Goals - 06/20/19 1346      PT LONG TERM GOAL #1   Title  Patient will be independent with final HEP in order to build upon functional gains made in PT. ALL LTGS DUE 08/09/19    Time  10   due to delay in scheduling   Period  Weeks    Status  New      PT LONG TERM GOAL #2   Title  Gait speed goal with rollator written as appropriate.    Baseline  not yet assessed.    Time  10    Period  Weeks    Status  New      PT LONG TERM GOAL #3   Title  TUG goal written as appropriate in order to determine fall risk.    Baseline  not yet assessed.    Time  10    Period  Weeks      PT LONG TERM GOAL #4   Title  goal to be written as appropriate in order to determine endurance.    Baseline  not yet assessed.    Time  10    Period  Weeks    Status  New      PT LONG TERM GOAL #5   Title  Patient will ambulate at least 200' over level indoor/unlevel outdoor surfaces with rollator and supervision in order to improve community mobility.    Baseline  fatigues with 50-60'    Time  10    Period  Weeks  Status  New            Plan - 06/20/19 1343    Clinical Impression Statement  Pt required frequent short seated rest periods with performing standing balance exercises issued for HEP; pt stated she was more tired today than she was at previous session last Thursday.  Pt needs UE support on counter for safety.  Pt tolerated supine exercises well.    Personal Factors and Comorbidities  Comorbidity 3+;Past/Current Experience;Time since onset of  injury/illness/exacerbation;Transportation    Comorbidities  PMH: orthostatic hypotension, hypothyroidism, depression, HLD, chronic migraine    Examination-Activity Limitations  Stand;Locomotion Level;Bend;Squat;Transfers    Examination-Participation Restrictions  Community Activity;Meal Prep;Cleaning;Shop;Laundry    Stability/Clinical Decision Making  Unstable/Unpredictable    Rehab Potential  Fair    PT Frequency  1x / week   1-2x per week   PT Duration  8 weeks    PT Treatment/Interventions  ADLs/Self Care Home Management;Aquatic Therapy;DME Instruction;Gait training;Functional mobility training;Therapeutic activities;Therapeutic exercise;Patient/family education;Neuromuscular re-education;Balance training;Energy conservation;Vestibular;Compression bandaging    PT Next Visit Plan  check HEP as issued on 06-19-19; cont balance training in standing as tolerated    Consulted and Agree with Plan of Care  Patient       Patient will benefit from skilled therapeutic intervention in order to improve the following deficits and impairments:  Abnormal gait, Decreased activity tolerance, Decreased balance, Decreased endurance, Decreased coordination, Difficulty walking, Decreased strength, Dizziness, Increased edema  Visit Diagnosis: Unsteadiness on feet  Other abnormalities of gait and mobility  Muscle weakness (generalized)     Problem List Patient Active Problem List   Diagnosis Date Noted  . Gait abnormality 04/17/2019  . Itching 04/17/2019  . Vitamin D deficiency 09/27/2018  . Fatigue 09/27/2018  . Chronic migraine 11/01/2017  . Chronic insomnia 11/01/2017  . Peripheral polyneuropathy 08/05/2017  . Memory loss 07/19/2017  . Autonomic failure 06/01/2017  . 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 Dizziness 04/26/2012  . Encounter for therapeutic drug monitoring 04/18/2012  . Unspecified psychosis 04/18/2012  . Osteoarthrosis, unspecified whether generalized or localized, shoulder  region 04/18/2012  . Pain in limb 04/18/2012  . Cervical spondylosis without myelopathy 04/18/2012  . Orthostatic hypotension 03/01/2012  . Hyperlipidemia 03/01/2012  . 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 Seasonal and perennial allergic rhinitis 10/02/2008  . Chronic bronchitis (HCC) 10/02/2008  . HEADACHE, CHRONIC 10/02/2008    Kary Kos, PT 06/20/2019, 1:47 PM  [facility] [address] [place], [place], [address] Phone: [phone]   Fax:  [phone]  Name: Mallory Durham MRN: [id number] Date of Birth: 09-Sep-1940

## 2019-06-21 ENCOUNTER — Ambulatory Visit: Payer: PPO

## 2019-06-22 ENCOUNTER — Ambulatory Visit: Payer: PPO | Attending: Internal Medicine

## 2019-06-22 DIAGNOSIS — Z23 Encounter for immunization: Secondary | ICD-10-CM | POA: Insufficient documentation

## 2019-06-22 NOTE — Progress Notes (Signed)
   Covid-19 Vaccination Clinic  Name:  Mallory Durham    MRN: [id number] DOB: 02/12/1941  06/22/2019  [name] was observed post Covid-19 immunization for 15 minutes without incidence. She was provided with Vaccine Information Sheet and instruction to access the V-Safe system.   [name] was instructed to call 911 with any severe reactions post vaccine: Marland Kitchen Difficulty breathing  . Swelling of your face and throat  . A fast heartbeat  . A bad rash all over your body  . Dizziness and weakness    Immunizations Administered    Name Date Dose VIS Date Route   Pfizer COVID-19 Vaccine 06/22/2019  5:00 PM 0.3 mL 04/27/2019 Intramuscular   Manufacturer: [employer], [employer]   Lot: [id number]   NDC: [address]-3

## 2019-06-25 ENCOUNTER — Telehealth: Payer: Self-pay | Admitting: Neurology

## 2019-06-25 MED ORDER — GABAPENTIN 100 MG PO CAPS: ORAL_CAPSULE | ORAL | 11 refills | Status: DC

## 2019-06-25 MED FILL — PROPRANOLOL HCL ER 60 MG CP: 60 | 30 days supply | Qty: 30 | Fill #2

## 2019-06-25 NOTE — Telephone Encounter (Signed)
Pt called and LVM wanting to speak to RN about her medications and also her Physical Therapy. Please advise.

## 2019-06-25 NOTE — Telephone Encounter (Signed)
I was able to speak to the patient. States her chronic itching has improved with gabapentin 100mg , one capsule TID but it is still an issue. She is asking for a dose increase so that she can stop supplementing with Benadryl.  She is having limited progress in PT due to her low BP. She gave her home exercises to complete at home but Mrs. Thomason needs clarification on how to proceed.  She had an appt w/ PT she canceled tomorrow because she will be able to visit her husband in his facility for the first time in the last year.

## 2019-06-25 NOTE — Telephone Encounter (Signed)
Left message requesting a return call.

## 2019-06-25 NOTE — Addendum Note (Signed)
Addended by: Lilla Shook on: 06/25/2019 05:51 PM   Modules accepted: Orders

## 2019-06-25 NOTE — Telephone Encounter (Signed)
Per vo by Dr. Terrace Arabia, she may increase her gabapentin 100mg  to one cap in am, one cap mid-day, two caps in evening. She may dose herself differently, if needed, based on when her symptoms are most bothersome. She will take a max of four capsules per day.   Additionally, she may attempt the home exercises provided to her or continue PT a while longer to see if it will provide some benefit. The patient would like to continue PT. States she prefers a and plans to ask for her when making her appointments.

## 2019-06-26 ENCOUNTER — Ambulatory Visit: Payer: PPO | Admitting: Physical Therapy

## 2019-06-26 MED FILL — GABAPENTIN 100 MG CAPSULE: 100 | 30 days supply | Qty: 120 | Fill #0

## 2019-06-29 ENCOUNTER — Other Ambulatory Visit: Payer: Self-pay

## 2019-06-29 ENCOUNTER — Other Ambulatory Visit: Payer: PPO | Admitting: *Deleted

## 2019-06-29 DIAGNOSIS — E876 Hypokalemia: Secondary | ICD-10-CM

## 2019-06-30 LAB — BASIC METABOLIC PANEL
BUN/Creatinine Ratio: 12 (ref 12–28)
BUN: 13 mg/dL (ref 8–27)
CO2: 25 mmol/L (ref 20–29)
Calcium: 9.1 mg/dL (ref 8.7–10.3)
Chloride: 103 mmol/L (ref 96–106)
Creatinine, Ser: 1.08 mg/dL — ABNORMAL HIGH (ref 0.57–1.00)
GFR calc Af Amer: 57 mL/min/{1.73_m2} — ABNORMAL LOW (ref >59)
GFR calc non Af Amer: 49 mL/min/{1.73_m2} — ABNORMAL LOW (ref >59)
Glucose: 78 mg/dL (ref 65–99)
Potassium: 4.9 mmol/L (ref 3.5–5.2)
Sodium: 141 mmol/L (ref 134–144)

## 2019-07-02 ENCOUNTER — Telehealth: Payer: Self-pay | Admitting: *Deleted

## 2019-07-02 MED ORDER — POTASSIUM CHLORIDE ER 10 MEQ PO TBCR: 30.0000 meq | EXTENDED_RELEASE_TABLET | Freq: Two times a day (BID) | ORAL | Status: DC

## 2019-07-02 NOTE — Telephone Encounter (Signed)
Pt called back and has been notified of lab results by phone with verbal understanding. Pt states though that she is not currently taking the K+ dosage of 40 meq BID. Pt states that dosage made her very nauseated. Pt states she dropped the dose down to 30 meq bid and she is handling that dose ok. I assured her that I will send a message to Tereso Newcomer, [facility] as to how she is currently taking her K+ and that we will call back if any new recommendations. I assured the pt I felt that the PA will be fine with the K+ 30 meq BID as her K+ is normal. Pt thanked me for the help. I will change med list to reflect K+ as pt is taking. Patient notified of result.  Please refer to phone note from today for complete details.   Danielle Rankin, CMA 07/02/2019 3:01 PM

## 2019-07-02 NOTE — Telephone Encounter (Signed)
Ok. Tereso Newcomer, PA-C    07/02/2019 5:57 PM

## 2019-07-02 NOTE — Telephone Encounter (Signed)
-----   Message from Beatrice Lecher, [place] sent at 07/01/2019  5:34 PM EST ----- Creatinine stable.  K+ now normal.   PLAN:   - Continue current medications/treatment plan and follow up as scheduled. Tereso Newcomer, PA-C    07/01/2019 5:33 PM

## 2019-07-03 ENCOUNTER — Ambulatory Visit: Payer: PPO | Admitting: Physical Therapy

## 2019-07-03 MED FILL — POTASSIUM CL ER 10 MEQ TAB: 10 | 30 days supply | Qty: 120 | Fill #3

## 2019-07-03 MED FILL — NORTRIPTYLINE HCL 10 MG CAP: 10 | 30 days supply | Qty: 60 | Fill #5

## 2019-07-09 ENCOUNTER — Other Ambulatory Visit: Payer: Self-pay

## 2019-07-09 ENCOUNTER — Encounter: Payer: Self-pay | Admitting: Emergency Medicine

## 2019-07-09 ENCOUNTER — Emergency Department (INDEPENDENT_AMBULATORY_CARE_PROVIDER_SITE_OTHER): Payer: PPO

## 2019-07-09 ENCOUNTER — Emergency Department (INDEPENDENT_AMBULATORY_CARE_PROVIDER_SITE_OTHER)
Admission: EM | Admit: 2019-07-09 | Discharge: 2019-07-09 | Disposition: A | Discharge status: Home / Self Care | Payer: PPO | Source: Home / Self Care | Attending: Family Medicine | Admitting: Family Medicine

## 2019-07-09 DIAGNOSIS — S92352A Displaced fracture of fifth metatarsal bone, left foot, initial encounter for closed fracture: Secondary | ICD-10-CM | POA: Diagnosis not present

## 2019-07-09 DIAGNOSIS — M7989 Other specified soft tissue disorders: Secondary | ICD-10-CM | POA: Diagnosis not present

## 2019-07-09 DIAGNOSIS — M25572 Pain in left ankle and joints of left foot: Secondary | ICD-10-CM

## 2019-07-09 DIAGNOSIS — M79672 Pain in left foot: Secondary | ICD-10-CM

## 2019-07-09 DIAGNOSIS — S93402A Sprain of unspecified ligament of left ankle, initial encounter: Secondary | ICD-10-CM | POA: Diagnosis not present

## 2019-07-09 DIAGNOSIS — S92355A Nondisplaced fracture of fifth metatarsal bone, left foot, initial encounter for closed fracture: Secondary | ICD-10-CM | POA: Diagnosis not present

## 2019-07-09 NOTE — ED Triage Notes (Signed)
Fell yesterday at 1830- twisted left foot, immediate pain - unable to bear weight on it now - in w/c - not norm for pt - here w/her daughter - Angelique Blonder

## 2019-07-09 NOTE — ED Provider Notes (Signed)
[facility]    CSN: [id number] Arrival date & time: 07/09/19  5956      History   Chief Complaint Chief Complaint  Patient presents with  . Foot Pain    left - fall last night using her walker    HPI Mallory Durham is a 79 y.o. female.   Patient fell last night while using her walker, twisting her left ankle/foot.  She had immediate pain/swelling and is unable to bear weight.  The history is provided by the patient and a relative.  Foot Injury Location:  Ankle and foot Time since incident:  1 day Injury: yes   Mechanism of injury: fall   Fall:    Fall occurred: while walking with walker.   Impact surface:  Hard floor Ankle location:  L ankle Foot location:  L foot Chronicity:  New Prior injury to area:  No Relieved by:  None tried Worsened by:  Bearing weight and activity Associated symptoms: decreased ROM, stiffness and swelling   Associated symptoms: no back pain, no muscle weakness, no numbness and no tingling   Risk factors: obesity     Past Medical History:  Diagnosis Date  . Anxiety   . Confusion, hx of, without neuro findings   . Depression   . Hx of vertigo   . Hyperlipidemia   . Hypothyroid   . Left shoulder pain   . Migraines   . Neck pain   . Orthostatic hypotension   . Renal insufficiency     Patient Active Problem List   Diagnosis Date Noted  . Gait abnormality 04/17/2019  . Itching 04/17/2019  . Vitamin D deficiency 09/27/2018  . Fatigue 09/27/2018  . Chronic migraine 11/01/2017  . Chronic insomnia 11/01/2017  . Peripheral polyneuropathy 08/05/2017  . Memory loss 07/19/2017  . Autonomic failure 06/01/2017  . 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 Dizziness 04/26/2012  . Encounter for therapeutic drug monitoring 04/18/2012  . Unspecified psychosis 04/18/2012  . Osteoarthrosis, unspecified whether generalized or localized, shoulder region 04/18/2012  . Pain in limb 04/18/2012  . Cervical spondylosis without myelopathy  04/18/2012  . Orthostatic hypotension 03/01/2012  . Hyperlipidemia 03/01/2012  . 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 Seasonal and perennial allergic rhinitis 10/02/2008  . Chronic bronchitis (HCC) 10/02/2008  . HEADACHE, CHRONIC 10/02/2008    Past Surgical History:  Procedure Laterality Date  . ABDOMINAL HYSTERECTOMY    . ABDOMINAL HYSTERECTOMY    . BREAST LUMPECTOMY    . CHOLECYSTECTOMY    . JOINT REPLACEMENT    . THYROID SURGERY    . TONSILLECTOMY      OB History   No obstetric history on file.      Home Medications    Prior to Admission medications   Medication Sig Start Date End Date Taking? Authorizing Provider  buPROPion (WELLBUTRIN XL) 150 MG 24 hr tablet Take 1 tablet (150 mg total) by mouth daily. 02/04/17  Yes Saguier, Ramon Dredge, PA-C  albuterol (PROVENTIL HFA;VENTOLIN HFA) 108 (90 Base) MCG/ACT inhaler Inhale 2 puffs into the lungs every 6 (six) hours as needed for wheezing or shortness of breath. 05/27/16   Saguier, Ramon Dredge, PA-C  aspirin-acetaminophen-caffeine (EXCEDRIN MIGRAINE) [phone] MG tablet Take by mouth every 6 (six) hours as needed for headache.    [provider]  botulinum toxin Type A (BOTOX) 100 units SOLR injection Inject IM every 3 months by MD in the office 12/07/17   Levert Feinstein, MD  Cholecalciferol (D3-1000 PO) Take 1 tablet by mouth daily.  [provider]  fludrocortisone (FLORINEF) 0.1 MG tablet TAKE 1 TABLET (0.1 MG TOTAL) BY MOUTH 3 (THREE) TIMES DAILY. 06/08/19   Levert Feinstein, MD  fluticasone (FLONASE) 50 MCG/ACT nasal spray Place into both nostrils daily as needed for allergies or rhinitis.    [provider]  fluticasone (FLOVENT HFA) 110 MCG/ACT inhaler Inhale 2 puffs into the lungs 2 (two) times daily as needed [facility]).    [provider]  gabapentin (NEURONTIN) 100 MG capsule Take one cap in am, one cap midday, two cap in pm. 06/25/19   Levert Feinstein, MD  midodrine (PROAMATINE) 10 MG tablet Take 1 tablet  (10 mg total) by mouth 3 (three) times daily. 07/21/18   Tereso Newcomer T, PA-C  Multiple Vitamins-Minerals (PRESERVISION AREDS 2) CAPS Take 2 capsules by mouth.    [provider]  nortriptyline (PAMELOR) 10 MG capsule TAKE 2 CAPSULES (20 MG TOTAL) BY MOUTH AT BEDTIME. 08/29/18   Levert Feinstein, MD  ondansetron (ZOFRAN) 4 MG tablet TAKE 1 TABLET (4 MG TOTAL) BY MOUTH EVERY 8 (EIGHT) HOURS AS NEEDED FOR NAUSEA OR VOMITING. 04/11/19   Levert Feinstein, MD  phenazopyridine (PYRIDIUM) 100 MG tablet Take 100 mg by mouth 3 (three) times daily as needed for pain.    [provider]  potassium chloride (KLOR-CON) 10 MEQ tablet Take 3 tablets (30 mEq total) by mouth 2 (two) times daily. 07/02/19   Tereso Newcomer T, PA-C  Probiotic Product (PROBIOTIC PO) Take 1 capsule by mouth as directed.    [provider]  propranolol ER (INDERAL LA) 60 MG 24 hr capsule Take 1 capsule (60 mg total) by mouth at bedtime. 12/04/18   Levert Feinstein, MD  sertraline (ZOLOFT) 100 MG tablet Take 100 mg by mouth daily.    [provider]  simvastatin (ZOCOR) 20 MG tablet Take 1 tablet (20 mg total) by mouth every evening. 03/07/17   Saguier, Ramon Dredge, PA-C  SUMAtriptan (IMITREX) 100 MG tablet Take 1 tab at onset of migraine.  May repeat in 2 hrs, if needed.  Max dose: 2 tabs/day. This is a 30 day prescription. 08/21/18   Levert Feinstein, MD  SYNTHROID 100 MCG tablet TAKE 1 TABLET (100 MCG TOTAL) BY MOUTH DAILY BEFORE BREAKFAST. 04/16/19   Carlus Pavlov, MD  tiZANidine (ZANAFLEX) 2 MG tablet Take 1 tablet (2 mg total) by mouth every 8 (eight) hours as needed for muscle spasms. 12/06/17   Levert Feinstein, MD  vitamin B-12 (CYANOCOBALAMIN) 1000 MCG tablet Take 1,000 mcg by mouth daily.    [provider]  zolpidem (AMBIEN) 10 MG tablet TAKE 1/2-1 TABLET (5-10 MG TOTAL) BY MOUTH AT BEDTIME AS NEEDED. 05/14/19   Levert Feinstein, MD    Family History Family History  Problem Relation Age of Onset  . Heart disease Mother         Angina  . Cancer Mother   . Cancer Father   . Heart disease Father   . Cancer Maternal Aunt     Social History Social History   Tobacco Use  . Smoking status: Never Smoker  . Smokeless tobacco: Never Used  Substance Use Topics  . Alcohol use: No  . Drug use: No     Allergies   Patient has no known allergies.   Review of Systems Review of Systems  Musculoskeletal: Positive for stiffness. Negative for back pain.       Left foot and ankle pain  All other systems reviewed and are negative.  Physical Exam Triage Vital Signs ED Triage Vitals  Enc Vitals Group     BP 07/09/19 1034 118/80     Pulse Rate 07/09/19 1034 74     Resp 07/09/19 1034 20     Temp 07/09/19 1034 98.7 F (37.1 C)     Temp Source 07/09/19 1034 Oral     SpO2 07/09/19 1034 99 %     Weight 07/09/19 1036 201 lb (91.2 kg)     Height 07/09/19 1036 5\' 7"  (1.702 m)     Head Circumference --      Peak Flow --      Pain Score 07/09/19 1035 0     Pain Loc --      Pain Edu? --      Excl. in [place]? --    No data found.  Updated Vital Signs BP 118/80 (BP Location: Right Arm)   Pulse 74   Temp 98.7 F (37.1 C) (Oral)   Resp 20   Ht 5\' 7"  (1.702 m)   Wt 91.2 kg   SpO2 99%   BMI 31.48 kg/m   Visual Acuity Right Eye Distance:   Left Eye Distance:   Bilateral Distance:    Right Eye Near:   Left Eye Near:    Bilateral Near:     Physical Exam Vitals and nursing note reviewed.  Constitutional:      General: She is not in acute distress.    Appearance: She is obese.  HENT:     Head: Atraumatic.  Eyes:     Pupils: Pupils are equal, round, and reactive to light.  Cardiovascular:     Rate and Rhythm: Normal rate.  Pulmonary:     Effort: Pulmonary effort is normal.  Musculoskeletal:     Left ankle: Swelling present. No deformity, ecchymosis or lacerations. Tenderness present over the lateral malleolus and base of 5th metatarsal. Decreased range of motion.     Left Achilles Tendon: Normal.        Feet:  Skin:    General: Skin is warm and dry.  Neurological:     Mental Status: She is alert.      UC Treatments / Results  Labs (all labs ordered are listed, but only abnormal results are displayed) Labs Reviewed - No data to display  EKG   Radiology DG Ankle Complete Left  Result Date: 07/09/2019 CLINICAL DATA:  Left ankle pain after fall last night. EXAM: LEFT ANKLE COMPLETE - 3+ VIEW COMPARISON:  None. FINDINGS: There is no evidence of fracture, dislocation, or joint effusion. There is no evidence of arthropathy or other focal bone abnormality. Soft tissue swelling is seen over the lateral malleolus. IMPRESSION: No fracture or dislocation is noted. Soft tissue swelling is seen over lateral malleolus suggesting ligamentous injury. Electronically Signed   By: Marijo Conception M.D.   On: 07/09/2019 11:41   DG Foot Complete Left  Result Date: 07/09/2019 CLINICAL DATA:  Foot pain, twisted foot last night pain at base of fifth metatarsal. EXAM: LEFT FOOT - COMPLETE 3+ VIEW COMPARISON:  None FINDINGS: Question of cortical irregularity along the base of the fifth metatarsal in this osteopenic patient. No signs of displaced fracture in this location. No additional injury is suspected. No significant soft tissue swelling. IMPRESSION: Potential nondisplaced fracture of the base of the fifth metatarsal in this osteopenic patient. Mild cortical irregularity is demonstrated without displacement. Electronically Signed   By: Zetta Bills M.D.   On: 07/09/2019 11:10  Procedures Procedures (including critical care time)  Medications Ordered in UC Medications - No data to display  Initial Impression / Assessment and Plan / UC Course  I have reviewed the triage vital signs and the nursing notes.  Pertinent labs & imaging results that were available during my care of the patient were reviewed by me and considered in my medical decision making (see chart for details).    Ace wrap  applied.  Dispensed AirCast stirrup splint. Followup with Dr. Rodney [facility] ([facility] for management.   Final Clinical Impressions(s) / UC Diagnoses   Final diagnoses:  Closed fracture of base of fifth metatarsal bone of left foot, initial encounter  Sprain of left ankle, unspecified ligament, initial encounter     Discharge Instructions     Apply ice pack for 30 minutes every 1 to 2 hours today and tomorrow.  Elevate.  Use walker.  Wear Ace wrap until swelling decreases.  Wear brace for about 2 to 3 weeks.  Begin range of motion and stretching exercises in about one week as per instruction sheet.     ED Prescriptions    None     PDMP not reviewed this encounter.   Lattie Haw, MD 07/09/19 [phone]

## 2019-07-09 NOTE — Discharge Instructions (Addendum)
Apply ice pack for 30 minutes every 1 to 2 hours today and tomorrow.  Elevate.  Use walker.  Wear Ace wrap until swelling decreases.  Wear brace for about 2 to 3 weeks.  Begin range of motion and stretching exercises in about one week as per instruction sheet.

## 2019-07-10 ENCOUNTER — Ambulatory Visit: Payer: PPO | Admitting: Physical Therapy

## 2019-07-11 ENCOUNTER — Telehealth: Payer: Self-pay | Admitting: Cardiovascular Disease

## 2019-07-11 MED FILL — SERTRALINE HCL 100 MG TABS: 100 | 20 days supply | Qty: 30 | Fill #1

## 2019-07-11 NOTE — Telephone Encounter (Signed)
I called and left a message for Mallory Durham to call me back.

## 2019-07-11 NOTE — Telephone Encounter (Signed)
Mallory Durham with [facility] is calling to discuss and confirm the patient's results from labs completed on 05/15/19.

## 2019-07-12 MED FILL — SUMATRIPTAN SUCC 100 MG TAB: 100 | 30 days supply | Qty: 12 | Fill #11

## 2019-07-13 ENCOUNTER — Ambulatory Visit: Payer: PPO | Admitting: Physical Therapy

## 2019-07-13 MED FILL — ZOLPIDEM TARTRATE 10 MG TAB: 10 | 30 days supply | Qty: 30 | Fill #2

## 2019-07-17 ENCOUNTER — Ambulatory Visit: Payer: PPO | Attending: Internal Medicine

## 2019-07-17 ENCOUNTER — Ambulatory Visit: Payer: PPO | Admitting: Physical Therapy

## 2019-07-17 DIAGNOSIS — Z23 Encounter for immunization: Secondary | ICD-10-CM | POA: Insufficient documentation

## 2019-07-17 NOTE — Progress Notes (Signed)
   Covid-19 Vaccination Clinic  Name:  Adrianne Shackleton    MRN: [id number] DOB: 15-Feb-1941  07/17/2019  Ms. Sobol was observed post Covid-19 immunization for 15 minutes without incident. She was provided with Vaccine Information Sheet and instruction to access the V-Safe system.   Ms. Stannard was instructed to call 911 with any severe reactions post vaccine: Marland Kitchen Difficulty breathing  . Swelling of face and throat  . A fast heartbeat  . A bad rash all over body  . Dizziness and weakness   Immunizations Administered    Name Date Dose VIS Date Route   Pfizer COVID-19 Vaccine 07/17/2019  4:10 PM 0.3 mL 04/27/2019 Intramuscular   Manufacturer: [employer], [employer]   Lot: [id number]   NDC: [address]-2

## 2019-07-17 NOTE — Telephone Encounter (Signed)
I called and left Victorino Dike a message to call back.

## 2019-07-18 ENCOUNTER — Ambulatory Visit: Payer: Medicare Other | Admitting: Neurology

## 2019-07-18 NOTE — Telephone Encounter (Signed)
Cammy Copa from [facility] did not return call.

## 2019-07-24 ENCOUNTER — Ambulatory Visit: Payer: PPO | Admitting: Physical Therapy

## 2019-07-25 ENCOUNTER — Other Ambulatory Visit: Payer: Self-pay

## 2019-07-25 MED ORDER — POTASSIUM CHLORIDE ER 10 MEQ PO TBCR: 30.0000 meq | EXTENDED_RELEASE_TABLET | Freq: Two times a day (BID) | ORAL | 2 refills | Status: DC

## 2019-07-30 ENCOUNTER — Other Ambulatory Visit: Payer: Self-pay | Admitting: Neurology

## 2019-07-30 MED FILL — FLUDROCORTISONE 0.1 MG TAB: 0.1 | 30 days supply | Qty: 90 | Fill #1

## 2019-07-30 MED FILL — PROPRANOLOL HCL ER 60 MG CP: 60 | 30 days supply | Qty: 30 | Fill #3

## 2019-07-30 MED FILL — SYNTHROID 100 MCG TABLET: 100 | 45 days supply | Qty: 45 | Fill #2

## 2019-07-30 MED FILL — POTASSIUM CL ER 10 MEQ TAB: 10 | 30 days supply | Qty: 120 | Fill #4

## 2019-07-31 ENCOUNTER — Ambulatory Visit: Payer: PPO | Admitting: Physical Therapy

## 2019-08-06 MED FILL — SUMATRIPTAN SUCC 100 MG TAB: 100 | 30 days supply | Qty: 12 | Fill #0

## 2019-08-07 ENCOUNTER — Ambulatory Visit: Payer: PPO | Admitting: Physical Therapy

## 2019-08-10 MED FILL — ZOLPIDEM TARTRATE 10 MG TAB: 10 | 30 days supply | Qty: 30 | Fill #3

## 2019-08-15 ENCOUNTER — Encounter: Payer: Self-pay | Admitting: Neurology

## 2019-08-15 ENCOUNTER — Ambulatory Visit: Payer: PPO | Admitting: Neurology

## 2019-08-15 ENCOUNTER — Other Ambulatory Visit: Payer: Self-pay

## 2019-08-15 VITALS — BP 122/82 | HR 72 | Temp 97.3°F | Ht 67.0 in | Wt 201.0 lb

## 2019-08-15 DIAGNOSIS — IMO0002 Reserved for concepts with insufficient information to code with codable children: Secondary | ICD-10-CM

## 2019-08-15 DIAGNOSIS — G43709 Chronic migraine without aura, not intractable, without status migrainosus: Secondary | ICD-10-CM | POA: Diagnosis not present

## 2019-08-15 MED ORDER — ONABOTULINUMTOXINA 100 UNITS IJ SOLR
200.0000 [IU] | Freq: Once | INTRAMUSCULAR | Status: AC
  Administered 2019-08-15: 200 [IU] via INTRAMUSCULAR

## 2019-08-15 NOTE — Progress Notes (Signed)
PATIENT: Mallory Durham DOB: 1941-04-13  Chief Complaint  Patient presents with  . Migraine    Botox 200 units x 2 vial - office supply     HISTORICAL  [name] is a 79 year old female, seen in refer by primary care PA Fortino Sic, for evaluation of migraine headaches, chronic neck pain, initial evaluation was on June 01, 2017.  Reviewed and summarized the referring note, she has past medical history of hypothyroidism, on supplement, hyperlipidemia, chronic neck pain, chronic renal insufficiency,  Since 2016, she noticed progressive worsening dizziness, to the point now, she sits down most of the time, is not able to exercise, in addition, she is under a lot of stress, tried to see her husband at the nursing home on a daily basis.  She drinks 2 of 12 ounce of water bottle every day, but she complains lightheadedness shortly after standing up, mild improvement walking short distance, but with prolonged walking and standing, she would develop this washout sensation, lightheadedness, as if she is going to faint, she has to sit down to improve her symptoms.  During today's examination, she was found to have significant orthostatic blood pressure change in the setting of already low blood pressure, lying down blood pressure 97/70, heart rate of 83, standing up systolic blood pressure was 50 or less, required multiple measurement to register, she does become symptomatic, especially after standing up for more than 3 minutes.  Her symptoms gradually improved after lying flat.  Previously she was given prescription of midodrine 2.5 mg to get in the early morning, and before bed, there was no significant improvement noted.  She also noted gradual onset memory loss, difficulty focusing, she has history of migraine headache, increased migraine over the past few months, she could not tolerate Imitrex 100 mg as needed due to heart palpitation, she usually take half of 100 mg tablets,  if needed, she may repeat in couple hours, which take away the headache in few hours,  She is quite disabled by her dizziness, has to stay seated most of the time, she denies bilateral lower extremity swelling, no significant sensory loss.  UPDATE July 18 2017: MRI of the brain in February 2019, generalized atrophy, especially at the right perisylvian fissure area, mild supratentorium small vessel disease, there is no acute abnormality.  Laboratory evaluation showed A1c of 5.2, normal negative ANA, copper, C-reactive protein, ESR, RPR, B12, CMP, lipid profile, BNP was elevated 246   Significant low vitamin D 5.2,  Echocardiogram showed ejection fraction of 55-60%, wall motion was normal, aortic valve showed no stenosis, ventricular size was normal, systolic function was normal  She is taking gabapentin 635m bid, midodrine 10 mg 3 times a day, last dose was before she goes to bed, she also complains of chronic insomnia, taking Ambien 10 mg at nighttime  She still has depression, anxiety, she has frequent headaches, once or twice each day, she has been 50 mg as needed, 50 mg or Maxalt as needed for headaches, which helps her headache some  UPDATE November 01 2017: She is accompanied by her daughter [name] today's clinical visit, functional status has much declined, despite midodrine 10 mg 3 times a day, and the Mestinon 60 mg 3 times a day, she complains of lightheadedness, dizziness when getting up just a few minutes, today she had significant orthostatic pressure dropped fIBBC/488/89to systolic 60 after standing up, difficult to register diastolic pressure.   She complains lightheadedness fainting sensation after lying down  for a few minutes,blood pressure points back to 120/80, her symptoms has much improved  She also complains of frequent migraine headaches, sometimes woke up with migraine headaches, taking Imitrex as needed  Chronic insomnia, Ambien 5 to 10 mg as needed  UPDATE Sept 9  2019: She is now taking fludrocortisone 0.1 mg 3 times a day, which has really helped her symptoms, along with Mestinon 60 mg daily, she is scheduled to see [facility] on February 07, 2018,  She complains of chronic migraine headaches for many years,3-4 times each week, right lateralized severe pounding headaches, with associated light noise sensitivity, Imitrex 100 mg as needed was helpful, still her headache last about 6 hours,  UPDATE May 02 2018: She is now taking fludrocortisone 0.3 mg 3 times a day, Mestinon 60 mg 3 times a day, midodrine was increased from 2.5 to 5 mg 3 times a day, orthostatic symptoms has much improved,  She has almost daily headache, more on right temporal region, imitrex 16m 1/2 tab daily,   She reported 40% improvement with Botox injection, she has less headache, less severe, but benefit short lasting,  UPDATE Sep 27 2018: She return for Botox injection as migraine prevention, this is the third time she had injection, she did reported at least moderate improvement of her migraine headaches, there was no significant side effect  She complains of excessive fatigue  UPDATE January 03 2019: She reported 50% improvement of her migraine headache, they are less severe, less frequent,   I personally reviewed CT head without contrast on November 09, 2018 that was normal Laboratory evaluations in May 2020 showed low normal B12 262, she is on supplement, Vitamin D was 148 she has normal thyroid panel  UPDATE Apr 17 2019: She complains of worsening headache at the end of Botox injection cycle, has been taking Imitrex on a daily basis over the past few days, continue complains of lack of stamina, increased gait abnormality, small shuffling gait, today's blood pressure sitting down 140/110, standing up 120/90,  [facility] comprehensive autoimmune dysautonomia panel was negative  She also complains of excessive itching, failed to respond to skin lotion, has to  take frequent Benadryl every 3 hours.  UPDATE M2021-04-13 Her husband passed away recently, her headache overall is under okay control, Botox injection was really helpful, gabapentin has helped her body itching  REVIEW OF SYSTEMS: Full 14 system review of systems performed and notable only for above  All rest review of system were negative   ASSESSMENT AND PLAN  LDejanira Pamintuanis a 79y.o. 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   Botox injection for chronic migraine prevention, injection was performed according to [facility]  protocol,  5 units of Botox was injected into each side, for 31 injection sites, total of 155 units  Bilateral frontalis 4 injection sites Bilateral corrugate 2 injection sites Procerus 1 injection sites. Bilateral temporalis 8 injection sites Bilateral occipitalis 6 injection sites Bilateral cervical paraspinals 4 injection sites Bilateral upper trapezius 6 injection sites  Extra 45 unites were injected into bilateral masseters muscle and right temporoparietal region    Marcial Pacas, M.D. Ph.D.  [facility] [address], [place], [place] [address] Ph: [phone] Fax: [phone]  CC: Fortino Sic, [place]

## 2019-08-15 NOTE — Progress Notes (Signed)
**  Botox 200 units x 1 vial, NDC 0023-3921-02, Lot [id number], Exp 04/2022, office supply.//mck,rn** 

## 2019-08-21 MED FILL — NORTRIPTYLINE HCL 10 MG CAP: 10 | 30 days supply | Qty: 60 | Fill #6

## 2019-08-21 MED FILL — GABAPENTIN 100 MG CAPSULE: 100 | 30 days supply | Qty: 120 | Fill #1

## 2019-08-21 MED FILL — buPROPion HCL ER (XL) 150 M: 150 | 30 days supply | Qty: 30 | Fill #1

## 2019-08-21 MED FILL — SERTRALINE HCL 100 MG TABS: 100 | 20 days supply | Qty: 30 | Fill #2

## 2019-08-29 MED FILL — FLUDROCORTISONE 0.1 MG TAB: 0.1 | 30 days supply | Qty: 90 | Fill #2

## 2019-08-29 MED FILL — PROPRANOLOL HCL ER 60 MG CP: 60 | 30 days supply | Qty: 30 | Fill #4

## 2019-08-29 MED FILL — POTASSIUM CL ER 10 MEQ TAB: 10 | 30 days supply | Qty: 120 | Fill #5

## 2019-09-07 MED FILL — SUMATRIPTAN SUCC 100 MG TAB: 100 | 30 days supply | Qty: 12 | Fill #1

## 2019-09-07 MED FILL — ZOLPIDEM TARTRATE 10 MG TAB: 10 | 30 days supply | Qty: 30 | Fill #4

## 2019-09-10 DIAGNOSIS — G43819 Other migraine, intractable, without status migrainosus: Secondary | ICD-10-CM | POA: Diagnosis not present

## 2019-09-10 DIAGNOSIS — H40013 Open angle with borderline findings, low risk, bilateral: Secondary | ICD-10-CM | POA: Diagnosis not present

## 2019-09-10 DIAGNOSIS — H04123 Dry eye syndrome of bilateral lacrimal glands: Secondary | ICD-10-CM | POA: Diagnosis not present

## 2019-09-10 DIAGNOSIS — H353132 Nonexudative age-related macular degeneration, bilateral, intermediate dry stage: Secondary | ICD-10-CM | POA: Diagnosis not present

## 2019-09-19 ENCOUNTER — Other Ambulatory Visit: Payer: Self-pay | Admitting: Neurology

## 2019-09-19 MED FILL — GABAPENTIN 100 MG CAPSULE: 100 | 30 days supply | Qty: 120 | Fill #2

## 2019-09-19 MED FILL — NORTRIPTYLINE HCL 10 MG CAP: 10 | 30 days supply | Qty: 60 | Fill #0

## 2019-09-19 MED FILL — buPROPion HCL ER (XL) 150 M: 150 | 30 days supply | Qty: 30 | Fill #2

## 2019-09-19 MED FILL — SYNTHROID 100 MCG TABLET: 100 | 45 days supply | Qty: 45 | Fill #3

## 2019-09-19 MED FILL — SERTRALINE HCL 100 MG TABS: 100 | 20 days supply | Qty: 30 | Fill #3

## 2019-10-03 MED FILL — ONDANSETRON HCL 4 MG TABLET: 4 | 7 days supply | Qty: 20 | Fill #1

## 2019-10-03 MED FILL — PROPRANOLOL HCL ER 60 MG CP: 60 | 30 days supply | Qty: 30 | Fill #5

## 2019-10-04 ENCOUNTER — Other Ambulatory Visit: Payer: Self-pay | Admitting: Internal Medicine

## 2019-10-04 ENCOUNTER — Other Ambulatory Visit: Payer: Self-pay

## 2019-10-04 ENCOUNTER — Encounter: Payer: Self-pay | Admitting: Internal Medicine

## 2019-10-04 ENCOUNTER — Ambulatory Visit: Payer: PPO | Admitting: Internal Medicine

## 2019-10-04 VITALS — BP 128/86 | HR 90 | Ht 67.0 in | Wt 208.0 lb

## 2019-10-04 DIAGNOSIS — E890 Postprocedural hypothyroidism: Secondary | ICD-10-CM

## 2019-10-04 LAB — TSH: TSH: 4.37 u[IU]/mL (ref 0.35–4.50)

## 2019-10-04 LAB — T4, FREE: Free T4: 1.13 ng/dL (ref 0.60–1.60)

## 2019-10-04 MED ORDER — SYNTHROID 100 MCG PO TABS: 100.0000 ug | ORAL_TABLET | Freq: Every day | ORAL | 11 refills | Status: DC

## 2019-10-04 NOTE — Patient Instructions (Signed)
Please continue Synthroid 100 mcg daily. ? ?Take the thyroid hormone every day, with water, at least 30 minutes before breakfast, separated by at least 4 hours from: ?- acid reflux medications ?- calcium ?- iron ?- multivitamins ? ?Please stop at the lab. ? ?Please come back for a follow-up appointment in 1 year. ? ? ?

## 2019-10-04 NOTE — Progress Notes (Signed)
Patient ID: Mallory Durham, female   DOB: 01-11-1941, 79 y.o.   MRN: [id number]   This visit occurred during the SARS-CoV-2 public health emergency.  Safety protocols were in place, including screening questions prior to the visit, additional usage of staff PPE, and extensive cleaning of exam room while observing appropriate contact time as indicated for disinfecting solutions.   HPI  Mallory Durham is a 79 y.o.-year-old female, presenting for f/u for postsurgical hypothyroidism. Last visit 1 year ago (virtual).  At last visit, she was more tired as she had a lot of stress with her husband who had dementia.  Before our last visit, he was moved in a long-term care facility. He passed away in 08-13-19.   Reviewed history: 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  A TSH returned slightly low in 09/2016 and we decreased the dose at that time to 112 mcg daily.  In 11/2017, we had to decrease the dose even more to 100 mcg daily.  She continues on Synthroid d.a.w. 100 mcg daily: - in am - fasting - at least 30 min from b'fast - no Ca, Fe, MVI, + PPIs at lunchtime - not on Biotin -+ AREDS 2 (no Ca or iron) 2x a day, first dose in am, with b'fast  Reviewed patient's TFTs:: Lab Results  Component Value Date   TSH 1.60 11/09/2018   TSH 4.700 (H) 09/27/2018   TSH 0.83 11/15/2017   TSH 0.10 (L) 09/27/2017   TSH 0.45 11/30/2016   TSH 0.26 (L) 09/27/2016   TSH 0.38 07/05/2016   TSH 0.37 03/22/2016   TSH 0.24 (L) 11/20/2015   TSH 0.03 (L) 08/26/2015   FREET4 1.13 11/09/2018   FREET4 0.94 11/15/2017   FREET4 1.01 09/27/2017   FREET4 0.99 11/30/2016   FREET4 1.13 09/27/2016   FREET4 0.95 07/05/2016   FREET4 1.22 03/22/2016   FREET4 0.92 11/20/2015   FREET4 1.44 08/26/2015  03/31/2015: TSH 6.82, fT4  1.24 01/22/2015: TSH 0.420 11/28/2014: TSH 5.440  TPO ABs per [facility] records - ? Why checked since pt had thyroidectomy in 2000...:  03/31/2015: TPO Abs <6 10/03/2013: TPO Abs 7 07/02/2010: TPO Abs <6  Pt denies: - feeling nodules in neck - hoarseness - dysphagia - choking - SOB with lying down  She has + FH of thyroid disorders in mother and sister: hypothyroidism. No FH of thyroid cancer. No h/o radiation tx to head or neck.  No herbal supplements. No Biotin use. No recent steroids use.   She also has a history of hysterectomy in 1994. Also, HL GERD, depression, ruptured Achilles tendon 2015.  She was dx'ed with autonomic dysfunction (low BP episodes) >> On fludrocortisone 3x a day and midodrine. She sees cardiology  On nortriptyline for headache prevention.  Also on gabapentin for neck muscle relaxation.  ROS: Constitutional: no weight gain/no weight loss, no fatigue, no subjective hyperthermia, no subjective hypothermia Eyes: no blurry vision, no xerophthalmia ENT: no sore throat, + see HPI Cardiovascular: no CP/no SOB/no palpitations/no leg swelling Respiratory: no cough/no SOB/no wheezing Gastrointestinal: no N/no V/no D/no C/no acid reflux Musculoskeletal: no muscle aches/no joint aches Skin: no rashes, no hair loss Neurological: no tremors/no numbness/no tingling/+ dizziness  I reviewed pt's medications, allergies, PMH, social hx, family hx, and changes were documented in the history of present illness. Otherwise, unchanged  from my initial visit note.  Past Medical History:  Diagnosis Date  . Anxiety   . Confusion, hx of, without neuro findings   . Depression   . Hx of vertigo   . Hyperlipidemia   . Hypothyroid   . Left shoulder pain   . Migraines   . Neck pain   . Orthostatic hypotension   . Renal insufficiency    Past Surgical History:  Procedure Laterality Date  . ABDOMINAL HYSTERECTOMY    . ABDOMINAL HYSTERECTOMY    . BREAST LUMPECTOMY    .  CHOLECYSTECTOMY    . JOINT REPLACEMENT    . THYROID SURGERY    . TONSILLECTOMY     Social History   Social History  . Marital status: Married    Spouse name: N/A  . Number of children: 2   Occupational History  .  Retired   Social History Main Topics  . Smoking status: Never Smoker  . Smokeless tobacco: Never Used  . Alcohol use No  . Drug use: No   Current Outpatient Medications on File Prior to Visit  Medication Sig Dispense Refill  . aspirin-acetaminophen-caffeine (EXCEDRIN MIGRAINE) 250-250-65 MG tablet Take by mouth every 6 (six) hours as needed for headache.    . botulinum toxin Type A (BOTOX) 100 units SOLR injection Inject IM every 3 months by MD in the office 2 vial 3  . buPROPion (WELLBUTRIN XL) 150 MG 24 hr tablet Take 1 tablet (150 mg total) by mouth daily. 30 tablet 0  . Cholecalciferol (D3-1000 PO) Take 1 tablet by mouth daily.    . fludrocortisone (FLORINEF) 0.1 MG tablet TAKE 1 TABLET (0.1 MG TOTAL) BY MOUTH 3 (THREE) TIMES DAILY. 90 tablet 6  . fluticasone (FLONASE) 50 MCG/ACT nasal spray Place into both nostrils daily as needed for allergies or rhinitis.    . fluticasone (FLOVENT HFA) 110 MCG/ACT inhaler Inhale 2 puffs into the lungs 2 (two) times daily as needed [facility]).    Marland Kitchen gabapentin (NEURONTIN) 100 MG capsule Take one cap in am, one cap midday, two cap in pm. 120 capsule 11  . midodrine (PROAMATINE) 10 MG tablet Take 1 tablet (10 mg total) by mouth 3 (three) times daily. 270 tablet 3  . Multiple Vitamins-Minerals (PRESERVISION AREDS 2) CAPS Take 2 capsules by mouth.    . nortriptyline (PAMELOR) 10 MG capsule TAKE 2 CAPSULES (20 MG TOTAL) BY MOUTH AT BEDTIME. 60 capsule 11  . ondansetron (ZOFRAN) 4 MG tablet TAKE 1 TABLET (4 MG TOTAL) BY MOUTH EVERY 8 (EIGHT) HOURS AS NEEDED FOR NAUSEA OR VOMITING. 20 tablet 1  . phenazopyridine (PYRIDIUM) 100 MG tablet Take 100 mg by mouth 3 (three) times daily as needed for pain.    . potassium chloride (KLOR-CON) 10 MEQ  tablet Take 3 tablets (30 mEq total) by mouth 2 (two) times daily. 540 tablet 2  . Probiotic Product (PROBIOTIC PO) Take 1 capsule by mouth as directed.    . propranolol ER (INDERAL LA) 60 MG 24 hr capsule Take 1 capsule (60 mg total) by mouth at bedtime. 90 capsule 3  . sertraline (ZOLOFT) 100 MG tablet Take 100 mg by mouth daily.    . simvastatin (ZOCOR) 20 MG tablet Take 1 tablet (20 mg total) by mouth every evening. 90 tablet 1  . SUMAtriptan (IMITREX) 100 MG tablet TAKE 1 TAB BY MOUTH AT ONSET OF MIGRAINE. MAY REPEAT IN 2 HRS, IF NEEDED. MAX DOSE: 2 TABS/DAY. THIS IS A 30 DAY PRESCRIPTION.  12 tablet 11  . SYNTHROID 100 MCG tablet TAKE 1 TABLET (100 MCG TOTAL) BY MOUTH DAILY BEFORE BREAKFAST. 45 tablet 5  . tiZANidine (ZANAFLEX) 2 MG tablet Take 1 tablet (2 mg total) by mouth every 8 (eight) hours as needed for muscle spasms. 30 tablet 1  . vitamin B-12 (CYANOCOBALAMIN) 1000 MCG tablet Take 1,000 mcg by mouth daily.    Marland Kitchen zolpidem (AMBIEN) 10 MG tablet TAKE 1/2-1 TABLET (5-10 MG TOTAL) BY MOUTH AT BEDTIME AS NEEDED. 30 tablet 5   No current facility-administered medications on file prior to visit.   No Known Allergies Family History  Problem Relation Age of Onset  . Heart disease Mother        Angina  . Cancer Mother   . Cancer Father   . Heart disease Father   . Cancer Maternal Aunt    PE: BP (!) 160/120   Pulse 90   Ht 5\' 7"  (1.702 m)   Wt 208 lb (94.3 kg)   SpO2 99%   BMI 32.58 kg/m  Wt Readings from Last 3 Encounters:  10/04/19 208 lb (94.3 kg)  08/15/19 201 lb (91.2 kg)  07/09/19 201 lb (91.2 kg)   Constitutional: overweight, in NAD Eyes: PERRLA, EOMI, no exophthalmos ENT: moist mucous membranes, no neck masses palpable, no cervical lymphadenopathy Cardiovascular: RRR, No MRG Respiratory: CTA B Gastrointestinal: abdomen soft, NT, ND, BS+ Musculoskeletal: no deformities, strength intact in all 4 Skin: moist, warm, no rashes Neurological: no tremor with outstretched  hands, DTR normal in all 4  ASSESSMENT: 1.  Postsurgical Hypothyroidism  2. HTN  PLAN:  1. Postsurgical Hypothyroidism - latest thyroid labs reviewed with pt >> normal: Lab Results  Component Value Date   TSH 1.60 11/09/2018   - she continues on LT4 100  mcg daily - pt feels good on this dose, without complaints. - we discussed about taking the thyroid hormone every day, with water, >30 minutes before breakfast, separated by >4 hours from acid reflux medications, calcium, iron, multivitamins. Pt. is taking it correctly. - will check thyroid tests today: TSH and fT4 - If labs are abnormal, she will need to return for repeat TFTs in 1.5 months -  I will see her back in 1 year but may need to return for repeat labs  2. HTN -She is seen by cardiology for autonomic dysfunction.  She is on fludrocortisone and midodrine -At the beginning of the visit, blood pressure was 160/120 and we discussed that she may need to see cardiology for medication adjustment, however, on recheck, at the end of the visit, this was 128/86  Needs refills - 30 day supply.  Office Visit on 10/04/2019  Component Date Value Ref Range Status  . TSH 10/04/2019 4.37  0.35 - 4.50 uIU/mL Final  . Free T4 10/04/2019 1.13  0.60 - 1.60 ng/dL Final   Comment: Specimens from patients who are undergoing biotin therapy and /or ingesting biotin supplements may contain high levels of biotin.  The higher biotin concentration in these specimens interferes with this Free T4 assay.  Specimens that contain high levels  of biotin may cause false high results for this Free T4 assay.  Please interpret results in light of the total clinical presentation of the patient.     TFTs are normal.  We will continue the current levothyroxine dose.  10/06/2019, MD PhD [facility]

## 2019-10-05 MED FILL — ZOLPIDEM TARTRATE 10 MG TAB: 10 | 30 days supply | Qty: 30 | Fill #5

## 2019-10-09 ENCOUNTER — Other Ambulatory Visit: Payer: Self-pay | Admitting: Physician Assistant

## 2019-10-09 MED FILL — POTASSIUM CL ER 10 MEQ TAB: 10 | 30 days supply | Qty: 120 | Fill #6

## 2019-10-10 MED FILL — SERTRALINE HCL 100 MG TABS: 100 | 20 days supply | Qty: 30 | Fill #4

## 2019-10-11 MED FILL — MIDODRINE HCL 10 MG TABS: 10 | 30 days supply | Qty: 90 | Fill #0

## 2019-10-16 MED FILL — FLUDROCORTISONE 0.1 MG TAB: 0.1 | 30 days supply | Qty: 90 | Fill #3

## 2019-10-17 ENCOUNTER — Ambulatory Visit (INDEPENDENT_AMBULATORY_CARE_PROVIDER_SITE_OTHER): Payer: PPO | Admitting: Cardiovascular Disease

## 2019-10-17 ENCOUNTER — Ambulatory Visit: Payer: Self-pay | Admitting: Neurology

## 2019-10-17 ENCOUNTER — Other Ambulatory Visit: Payer: Self-pay

## 2019-10-17 ENCOUNTER — Encounter: Payer: Self-pay | Admitting: Cardiovascular Disease

## 2019-10-17 VITALS — BP 116/70 | HR 82 | Ht 67.0 in | Wt 208.8 lb

## 2019-10-17 DIAGNOSIS — G901 Familial dysautonomia [Riley-Day]: Secondary | ICD-10-CM

## 2019-10-17 DIAGNOSIS — E876 Hypokalemia: Secondary | ICD-10-CM | POA: Diagnosis not present

## 2019-10-17 NOTE — Progress Notes (Signed)
Cardiology Office Note:    Date:  10/17/2019   ID:  Mallory Durham, DOB [birth year], MRN [id number]  PCP:  Clide Dales, PA  Cardiologist:   Previous Jens Som , now Jaelie Aguilera  Electrophysiologist:  None   Referring MD: Clide Dales, *   1.  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  2.  Orthostatic hypotension 3.  Hypothyroidism  No chief complaint on file.    Sept. 16, 2019   Mallory Durham is a 79 y.o. female with a hx of dysautonomia.  This was diagnosed this past January.  Managed by neurology.  She is currently on Mestinon.   Has had a long history of orthostatic hypotension .  Currently on Florinef 0.1 mg 3 times a day.  The florinef has helped.   The Mestinon has not helped.  Neuro had discussed. Drox - Dopa ( which is $8000 per month)   No CP  Has dyspnea and dizziness when her BP drops Does oK while she is sitting in a chair Has episodes of near syncope .   Was much worse before florinev   Echocardiogram in February, 2019 reveals normal left ventricular systolic function.  She has grade 2 diastolic dysfunction.  There is mild aortic insufficiency.  No benefit from Midodrine  On no BP lowering meds .  Eats lots of salt   October 17, 2019:   Mallory Durham is seen today for follow-up visit.  Has severe dysautonomia  Is on flouronif and mididrine.    Is no longer on mestinon  No CP or dyspnea  Has severe fatigue  Echo from 2019 showed normal LV systolic function with grade 2 diastolic dysfunction  Mild AI   She was last see by Tereso Newcomer in Dec. 2020   Cannot get out and exercise.   Gets too tired.  Has severe episodes of dysautonomia .    She is steadily declining.    Past Medical History:  Diagnosis Date  . Anxiety   . Confusion, hx of, without neuro findings   . Depression   . Hx of vertigo   . Hyperlipidemia   . Hypothyroid   . Left shoulder pain   . Migraines   . Neck pain   . Orthostatic hypotension   . Renal insufficiency     Past  Surgical History:  Procedure Laterality Date  . ABDOMINAL HYSTERECTOMY    . ABDOMINAL HYSTERECTOMY    . BREAST LUMPECTOMY    . CHOLECYSTECTOMY    . JOINT REPLACEMENT    . THYROID SURGERY    . TONSILLECTOMY      Current Medications: Current Meds  Medication Sig  . aspirin-acetaminophen-caffeine (EXCEDRIN MIGRAINE) 250-250-65 MG tablet Take by mouth every 6 (six) hours as needed for headache.  . botulinum toxin Type A (BOTOX) 100 units SOLR injection Inject IM every 3 months by MD in the office  . buPROPion (WELLBUTRIN XL) 150 MG 24 hr tablet Take 1 tablet (150 mg total) by mouth daily.  . Cholecalciferol (D3-1000 PO) Take 1 tablet by mouth daily.  . fludrocortisone (FLORINEF) 0.1 MG tablet TAKE 1 TABLET (0.1 MG TOTAL) BY MOUTH 3 (THREE) TIMES DAILY.  . fluticasone (FLONASE) 50 MCG/ACT nasal spray Place into both nostrils daily as needed for allergies or rhinitis.  . fluticasone (FLOVENT HFA) 110 MCG/ACT inhaler Inhale 2 puffs into the lungs 2 (two) times daily as needed [facility]).  Marland Kitchen gabapentin (NEURONTIN) 100 MG capsule Take one cap in am, one cap midday, two cap in pm.  .  midodrine (PROAMATINE) 10 MG tablet Take 1 tablet (10 mg total) by mouth 3 (three) times daily. Pt needs to keep appt with provider in June for further refills  . Multiple Vitamins-Minerals (PRESERVISION AREDS 2) CAPS Take 2 capsules by mouth.  . nortriptyline (PAMELOR) 10 MG capsule TAKE 2 CAPSULES (20 MG TOTAL) BY MOUTH AT BEDTIME.  Marland Kitchen ondansetron (ZOFRAN) 4 MG tablet TAKE 1 TABLET (4 MG TOTAL) BY MOUTH EVERY 8 (EIGHT) HOURS AS NEEDED FOR NAUSEA OR VOMITING.  . phenazopyridine (PYRIDIUM) 100 MG tablet Take 100 mg by mouth 3 (three) times daily as needed for pain.  . potassium chloride (KLOR-CON) 10 MEQ tablet Take 3 tablets (30 mEq total) by mouth 2 (two) times daily.  . Probiotic Product (PROBIOTIC PO) Take 1 capsule by mouth as directed.  . propranolol ER (INDERAL LA) 60 MG 24 hr capsule Take 1 capsule (60 mg  total) by mouth at bedtime.  . sertraline (ZOLOFT) 100 MG tablet Take 100 mg by mouth daily.  . simvastatin (ZOCOR) 20 MG tablet Take 1 tablet (20 mg total) by mouth every evening.  . SUMAtriptan (IMITREX) 100 MG tablet TAKE 1 TAB BY MOUTH AT ONSET OF MIGRAINE. MAY REPEAT IN 2 HRS, IF NEEDED. MAX DOSE: 2 TABS/DAY. THIS IS A 30 DAY PRESCRIPTION.  . SYNTHROID 100 MCG tablet Take 1 tablet (100 mcg total) by mouth daily before breakfast.  . tiZANidine (ZANAFLEX) 2 MG tablet Take 1 tablet (2 mg total) by mouth every 8 (eight) hours as needed for muscle spasms.  . vitamin B-12 (CYANOCOBALAMIN) 1000 MCG tablet Take 1,000 mcg by mouth daily.  Marland Kitchen zolpidem (AMBIEN) 10 MG tablet TAKE 1/2-1 TABLET (5-10 MG TOTAL) BY MOUTH AT BEDTIME AS NEEDED.     Allergies:   Patient has no known allergies.   Social History   Socioeconomic History  . Marital status: Married    Spouse name: Not on file  . Number of children: 2  . Years of education: 16  . Highest education level: Bachelor's degree (e.g., BA, AB, BS)  Occupational History    Employer: RETIRED  Tobacco Use  . Smoking status: Never Smoker  . Smokeless tobacco: Never Used  Substance and Sexual Activity  . Alcohol use: No  . Drug use: No  . Sexual activity: Not on file  Other Topics Concern  . Not on file  Social History Narrative   Lives at home alone.   Right-handed.   2-3 cups caffeine per day.   Social Determinants of Health   Financial Resource Strain:   . Difficulty of Paying Living Expenses:   Food Insecurity:   . Worried About Programme researcher, broadcasting/film/video in the Last Year:   . Barista in the Last Year:   Transportation Needs:   . Freight forwarder (Medical):   Marland Kitchen Lack of Transportation (Non-Medical):   Physical Activity:   . Days of Exercise per Week:   . Minutes of Exercise per Session:   Stress:   . Feeling of Stress :   Social Connections:   . Frequency of Communication with Friends and Family:   . Frequency of Social  Gatherings with Friends and Family:   . Attends Religious Services:   . Active Member of Clubs or Organizations:   . Attends Banker Meetings:   Marland Kitchen Marital Status:      Family History: The patient's family history includes Cancer in her father, maternal aunt, and mother; Heart disease in her father  and mother.  ROS:   Please see the history of present illness.     All other systems reviewed and are negative.  EKGs/Labs/Other Studies Reviewed:    The following studies were reviewed today:   EKG:     Recent Labs: 05/15/2019: Hemoglobin 13.1; Platelets 307 06/29/2019: BUN 13; Creatinine, Ser 1.08; Potassium 4.9; Sodium 141 10/04/2019: TSH 4.37  Recent Lipid Panel    Component Value Date/Time   CHOL 151 10/03/2013 0000   TRIG 100 10/03/2013 0000   HDL 61 10/03/2013 0000   LDLCALC 70 10/03/2013 0000    Physical Exam:    VS:  BP 116/70   Pulse 82   Ht 5\' 7"  (1.702 m)   Wt 208 lb 12.8 oz (94.7 kg)   SpO2 99%   BMI 32.70 kg/m     Wt Readings from Last 3 Encounters:  10/17/19 208 lb 12.8 oz (94.7 kg)  10/04/19 208 lb (94.3 kg)  08/15/19 201 lb (91.2 kg)     GEN:  Elderly female,   Moderately obese HEENT: Normal NECK: No JVD; No carotid bruits LYMPHATICS: No lymphadenopathy CARDIAC: RRR, no murmurs, rubs, gallops RESPIRATORY:  Clear to auscultation without rales, wheezing or rhonchi  ABDOMEN: Soft, non-tender, non-distended MUSCULOSKELETAL:  No edema; No deformity  SKIN: Warm and dry NEUROLOGIC:  Alert and oriented x 3 PSYCHIATRIC:  Normal affect   ASSESSMENT:    1. Dysautonomia ([place])   2. Hypokalemia    PLAN:    In order of problems listed above:  1. Dysautonomia: The patient has been diagnosed with dysautonomia and as result has orthostatic hypotension.  She has felt much better after starting Florinef.  We have added midodrine.  BP is stable here today but she continues to have severe limitation ( almostt daily now )   I do not have much  to add to this complex problem I will see if Dr. Caryl Comes has some ideas and would be willing to see her Alternatively, she may need to go to one of the local universities for further consultation .      Medication Adjustments/Labs and Tests Ordered: Current medicines are reviewed at length with the patient today.  Concerns regarding medicines are outlined above.  Orders Placed This Encounter  Procedures  . Basic metabolic panel  . Ambulatory referral to Cardiac Electrophysiology   No orders of the defined types were placed in this encounter.    Patient Instructions  Your physician recommends that you continue on your current medications as directed. Please refer to the Current Medication list given to you today.   You have been referred to  DR [facility] FOR DYSAUTONOMIA    Signed, Mertie Moores, MD  10/17/2019 5:19 PM    [place]

## 2019-10-17 NOTE — Patient Instructions (Signed)
Your physician recommends that you continue on your current medications as directed. Please refer to the Current Medication list given to you today.   You have been referred to  DR Graciela Husbands FOR DYSAUTONOMIA

## 2019-10-18 MED FILL — SUMATRIPTAN SUCC 100 MG TAB: 100 | 30 days supply | Qty: 12 | Fill #2

## 2019-11-01 ENCOUNTER — Other Ambulatory Visit: Payer: Self-pay | Admitting: Neurology

## 2019-11-02 MED FILL — ZOLPIDEM TARTRATE 10 MG TAB: 10 | 30 days supply | Qty: 30 | Fill #0

## 2019-11-05 ENCOUNTER — Telehealth: Payer: Self-pay | Admitting: Neurology

## 2019-11-05 NOTE — Telephone Encounter (Signed)
Patient has Medicare A & B as primary, but I called patient's secondary insurance [employer] [phone]) and spoke to [place] to make sure PA is not required for [id number]. She states no PA is required, and reference number would be [place] and today's date, 11/05/19.

## 2019-11-08 MED FILL — PROPRANOLOL HCL ER 60 MG CP: 60 | 30 days supply | Qty: 30 | Fill #6

## 2019-11-13 MED FILL — SYNTHROID 100 MCG TABLET: 100 | 45 days supply | Qty: 45 | Fill #4

## 2019-11-15 ENCOUNTER — Ambulatory Visit: Payer: PPO | Admitting: Neurology

## 2019-11-15 ENCOUNTER — Other Ambulatory Visit: Payer: Self-pay

## 2019-11-15 ENCOUNTER — Other Ambulatory Visit: Payer: Self-pay | Admitting: Neurology

## 2019-11-15 ENCOUNTER — Encounter: Payer: Self-pay | Admitting: Neurology

## 2019-11-15 VITALS — BP 128/85 | HR 86 | Ht 67.0 in | Wt 209.5 lb

## 2019-11-15 DIAGNOSIS — R269 Unspecified abnormalities of gait and mobility: Secondary | ICD-10-CM | POA: Diagnosis not present

## 2019-11-15 DIAGNOSIS — G43709 Chronic migraine without aura, not intractable, without status migrainosus: Secondary | ICD-10-CM

## 2019-11-15 DIAGNOSIS — I951 Orthostatic hypotension: Secondary | ICD-10-CM | POA: Diagnosis not present

## 2019-11-15 DIAGNOSIS — IMO0002 Reserved for concepts with insufficient information to code with codable children: Secondary | ICD-10-CM

## 2019-11-15 MED ORDER — MIDODRINE HCL 10 MG PO TABS: 10.0000 mg | ORAL_TABLET | Freq: Three times a day (TID) | ORAL | 4 refills | Status: DC

## 2019-11-15 MED ORDER — GABAPENTIN 100 MG PO CAPS: 300.0000 mg | ORAL_CAPSULE | Freq: Three times a day (TID) | ORAL | 4 refills | Status: DC

## 2019-11-15 MED FILL — MIDODRINE HCL 10 MG TABS: 10 | 90 days supply | Qty: 270 | Fill #0

## 2019-11-15 NOTE — Progress Notes (Signed)
PATIENT: Mallory Durham DOB: Aug 28, 1940  Chief Complaint  Patient presents with  . Migraine    Botox     HISTORICAL  Mallory Durham is a 79 year old female, seen in refer by primary care PA Mallory Durham, for evaluation of migraine headaches, chronic neck pain, initial evaluation was on June 01, 2017.  Reviewed and summarized the referring note, Mallory Durham has past medical history of hypothyroidism, on supplement, hyperlipidemia, chronic neck pain, chronic renal insufficiency,  Since 2016, Mallory Durham noticed progressive worsening dizziness, to the point now, Mallory Durham sits down most of the time, is not able to exercise, in addition, Mallory Durham is under a lot of stress, tried to see her husband at the nursing home on a daily basis.  Mallory Durham drinks 2 of 12 ounce of water bottle every day, but Mallory Durham complains lightheadedness shortly after standing up, mild improvement walking short distance, but with prolonged walking and standing, Mallory Durham would develop this washout sensation, lightheadedness, as if Mallory Durham is going to faint, Mallory Durham has to sit down to improve her symptoms.  During today's examination, Mallory Durham was found to have significant orthostatic blood pressure change in the setting of already low blood pressure, lying down blood pressure 97/70, heart rate of 83, standing up systolic blood pressure was 50 or less, required multiple measurement to register, Mallory Durham does become symptomatic, especially after standing up for more than 3 minutes.  Her symptoms gradually improved after lying flat.  Previously Mallory Durham was given prescription of midodrine 2.5 mg to get in the early morning, and before bed, there was no significant improvement noted.  Mallory Durham also noted gradual onset memory loss, difficulty focusing, Mallory Durham has history of migraine headache, increased migraine over the past few months, Mallory Durham could not tolerate Imitrex 100 mg as needed due to heart palpitation, Mallory Durham usually take half of 100 mg tablets, if needed, Mallory Durham may repeat in couple  hours, which take away the headache in few hours,  Mallory Durham is quite disabled by her dizziness, has to stay seated most of the time, Mallory Durham denies bilateral lower extremity swelling, no significant sensory loss.  UPDATE July 18 2017: MRI of the brain in February 2019, generalized atrophy, especially at the right perisylvian fissure area, mild supratentorium small vessel disease, there is no acute abnormality.  Laboratory evaluation showed A1c of 5.2, normal negative ANA, copper, C-reactive protein, ESR, RPR, B12, CMP, lipid profile, BNP was elevated 246   Significant low vitamin D 5.2,  Echocardiogram showed ejection fraction of 55-60%, wall motion was normal, aortic valve showed no stenosis, ventricular size was normal, systolic function was normal  Mallory Durham is taking gabapentin 675m bid, midodrine 10 mg 3 times a day, last dose was before Mallory Durham goes to bed, Mallory Durham also complains of chronic insomnia, taking Ambien 10 mg at nighttime  Mallory Durham still has depression, anxiety, Mallory Durham has frequent headaches, once or twice each day, Mallory Durham has been 50 mg as needed, 50 mg or Maxalt as needed for headaches, which helps her headache some  UPDATE November 01 2017: Mallory Durham is accompanied by her daughter Mallory Durham today's clinical visit, functional status has much declined, despite midodrine 10 mg 3 times a day, and the Mestinon 60 mg 3 times a day, Mallory Durham complains of lightheadedness, dizziness when getting up just a few minutes, today Mallory Durham had significant orthostatic pressure dropped fOMBT/597/41to systolic 60 after standing up, difficult to register diastolic pressure.   Mallory Durham complains lightheadedness fainting sensation after lying down for a few minutes,blood pressure points back to  120/80, her symptoms has much improved  Mallory Durham also complains of frequent migraine headaches, sometimes woke up with migraine headaches, taking Imitrex as needed  Chronic insomnia, Ambien 5 to 10 mg as needed  UPDATE Sept 9 2019: Mallory Durham is now taking  fludrocortisone 0.1 mg 3 times a day, which has really helped her symptoms, along with Mestinon 60 mg daily, Mallory Durham is scheduled to see [facility] on February 07, 2018,  Mallory Durham complains of chronic migraine headaches for many years,3-4 times each week, right lateralized severe pounding headaches, with associated light noise sensitivity, Imitrex 100 mg as needed was helpful, still her headache last about 6 hours,  UPDATE May 02 2018: Mallory Durham is now taking fludrocortisone 0.3 mg 3 times a day, Mestinon 60 mg 3 times a day, midodrine was increased from 2.5 to 5 mg 3 times a day, orthostatic symptoms has much improved,  Mallory Durham has almost daily headache, more on right temporal region, imitrex 175m 1/2 tab daily,   Reported 40% improvement with Botox injection, Mallory Durham has less headache, less severe, but benefit short lasting,   REVIEW OF SYSTEMS: Full 14 system review of systems performed and notable only for above  All rest review of system were negative  ALLERGIES: No Known Allergies   HOME MEDICATIONS: Current Outpatient Medications  Medication Sig Dispense Refill  . aspirin-acetaminophen-caffeine (EXCEDRIN MIGRAINE) 250-250-65 MG tablet Take by mouth every 6 (six) hours as needed for headache.    . botulinum toxin Type A (BOTOX) 100 units SOLR injection Inject IM every 3 months by MD in the office 2 vial 3  . buPROPion (WELLBUTRIN XL) 150 MG 24 hr tablet Take 1 tablet (150 mg total) by mouth daily. 30 tablet 0  . Cholecalciferol (D3-1000 PO) Take 1 tablet by mouth daily.    . fludrocortisone (FLORINEF) 0.1 MG tablet TAKE 1 TABLET (0.1 MG TOTAL) BY MOUTH 3 (THREE) TIMES DAILY. 90 tablet 6  . fluticasone (FLONASE) 50 MCG/ACT nasal spray Place into both nostrils daily as needed for allergies or rhinitis.    . fluticasone (FLOVENT HFA) 110 MCG/ACT inhaler Inhale 2 puffs into the lungs 2 (two) times daily as needed ([facility].    .Marland Kitchengabapentin (NEURONTIN) 100 MG capsule Take 3 capsules (300  mg total) by mouth 3 (three) times daily. Take one cap in am, one cap midday, two cap in pm. 810 capsule 4  . midodrine (PROAMATINE) 10 MG tablet Take 1 tablet (10 mg total) by mouth 3 (three) times daily. Pt needs to keep appt with provider in June for further refills 270 tablet 4  . Multiple Vitamins-Minerals (PRESERVISION AREDS 2) CAPS Take 2 capsules by mouth.    . nortriptyline (PAMELOR) 10 MG capsule TAKE 2 CAPSULES (20 MG TOTAL) BY MOUTH AT BEDTIME. 60 capsule 11  . ondansetron (ZOFRAN) 4 MG tablet TAKE 1 TABLET (4 MG TOTAL) BY MOUTH EVERY 8 (EIGHT) HOURS AS NEEDED FOR NAUSEA OR VOMITING. 20 tablet 1  . phenazopyridine (PYRIDIUM) 100 MG tablet Take 100 mg by mouth 3 (three) times daily as needed for pain.    . potassium chloride (KLOR-CON) 10 MEQ tablet Take 3 tablets (30 mEq total) by mouth 2 (two) times daily. 540 tablet 2  . Probiotic Product (PROBIOTIC PO) Take 1 capsule by mouth as directed.    . propranolol ER (INDERAL LA) 60 MG 24 hr capsule Take 1 capsule (60 mg total) by mouth at bedtime. 90 capsule 3  . sertraline (ZOLOFT) 100 MG tablet Take 100  mg by mouth daily.    . simvastatin (ZOCOR) 20 MG tablet Take 1 tablet (20 mg total) by mouth every evening. 90 tablet 1  . SUMAtriptan (IMITREX) 100 MG tablet TAKE 1 TAB BY MOUTH AT ONSET OF MIGRAINE. MAY REPEAT IN 2 HRS, IF NEEDED. MAX DOSE: 2 TABS/DAY. THIS IS A 30 DAY PRESCRIPTION. 12 tablet 11  . SYNTHROID 100 MCG tablet Take 1 tablet (100 mcg total) by mouth daily before breakfast. 30 tablet 11  . tiZANidine (ZANAFLEX) 2 MG tablet Take 1 tablet (2 mg total) by mouth every 8 (eight) hours as needed for muscle spasms. 30 tablet 1  . vitamin B-12 (CYANOCOBALAMIN) 1000 MCG tablet Take 1,000 mcg by mouth daily.    Marland Kitchen zolpidem (AMBIEN) 10 MG tablet TAKE 1/2 TO 1 TABLET BY MOUTH AT BEDTIME AS NEEDED 30 tablet 5   No current facility-administered medications for this visit.    PAST MEDICAL HISTORY: Past Medical History:  Diagnosis Date  .  Anxiety   . Confusion, hx of, without neuro findings   . Depression   . Hx of vertigo   . Hyperlipidemia   . Hypothyroid   . Left shoulder pain   . Migraines   . Neck pain   . Orthostatic hypotension   . Renal insufficiency     PAST SURGICAL HISTORY: Past Surgical History:  Procedure Laterality Date  . ABDOMINAL HYSTERECTOMY    . ABDOMINAL HYSTERECTOMY    . BREAST LUMPECTOMY    . CHOLECYSTECTOMY    . JOINT REPLACEMENT    . THYROID SURGERY    . TONSILLECTOMY      FAMILY HISTORY: Family History  Problem Relation Age of Onset  . Heart disease Mother        Angina  . Cancer Mother   . Cancer Father   . Heart disease Father   . Cancer Maternal Aunt     SOCIAL HISTORY:  Social History   Socioeconomic History  . Marital status: Married    Spouse name: Not on file  . Number of children: 2  . Years of education: 16  . Highest education level: Bachelor's degree (e.g., BA, AB, BS)  Occupational History    Employer: RETIRED  Tobacco Use  . Smoking status: Never Smoker  . Smokeless tobacco: Never Used  Vaping Use  . Vaping Use: Never used  Substance and Sexual Activity  . Alcohol use: No  . Drug use: No  . Sexual activity: Not on file  Other Topics Concern  . Not on file  Social History Narrative   Lives at home alone.   Right-handed.   2-3 cups caffeine per day.   Social Determinants of Health   Financial Resource Strain:   . Difficulty of Paying Living Expenses:   Food Insecurity:   . Worried About Charity fundraiser in the Last Year:   . Arboriculturist in the Last Year:   Transportation Needs:   . Film/video editor (Medical):   Marland Kitchen Lack of Transportation (Non-Medical):   Physical Activity:   . Days of Exercise per Week:   . Minutes of Exercise per Session:   Stress:   . Feeling of Stress :   Social Connections:   . Frequency of Communication with Friends and Family:   . Frequency of Social Gatherings with Friends and Family:   . Attends  Religious Services:   . Active Member of Clubs or Organizations:   . Attends Archivist Meetings:   .  Marital Status:   Intimate Partner Violence:   . Fear of Current or Ex-Partner:   . Emotionally Abused:   Marland Kitchen Physically Abused:   . Sexually Abused:      PHYSICAL EXAM Blood pressure lying down 115/72 heart rate of 60, standing up was 120/80  Body mass index is 32.81 kg/m.  PHYSICAL EXAMNIATION:  Gen: NAD, conversant, well nourised, obese, well groomed                     Cardiovascular: Regular rate rhythm, no peripheral edema, warm, nontender. Eyes: Conjunctivae clear without exudates or hemorrhage Neck: Supple, no carotid bruits. Pulmonary: Clear to auscultation bilaterally   NEUROLOGICAL EXAM:  MENTAL STATUS: Speech:    Speech is normal; fluent and spontaneous with normal comprehension.  Cognition:     Orientation to time, place and person     Normal recent and remote memory     Normal Attention span and concentration     Normal Language, naming, repeating,spontaneous speech     Fund of knowledge   CRANIAL NERVES: CN II: Visual fields are full to confrontation. Pupils are round equal and briskly reactive to light. CN III, IV, VI: extraocular movement are normal. No ptosis. CN V: Facial sensation is intact to pinprick in all 3 divisions bilaterally. Corneal responses are intact.  CN VII: Face is symmetric with normal eye closure and smile. CN VIII: Hearing is normal to rubbing fingers CN IX, X: Palate elevates symmetrically. Phonation is normal. CN XI: Head turning and shoulder shrug are intact CN XII: Tongue is midline with normal movements and no atrophy.  MOTOR: There is no pronator drift of out-stretched arms. Muscle bulk and tone are normal. Muscle strength is normal.  REFLEXES: Reflexes are 2+ and symmetric at the biceps, triceps, knees, and ankles. Plantar responses are flexor.  SENSORY: Mildly less dependent sensory changes to  pinprick.  COORDINATION: No limb or truncal ataxia noted  GAIT/STANCE: Mallory Durham can get up without pushing on chair arm, steady, Romberg is absent.   DIAGNOSTIC DATA (LABS, IMAGING, TESTING) - I reviewed patient records, labs, notes, testing and imaging myself where available.   ASSESSMENT AND PLAN  Mallory Durham is a 79 y.o. female   Autonomic failure  With significant orthostatic blood pressure changes,  No treatable etiology found, most consistent with central nervous system degenerative disorder  Mallory Durham also reported pseudobulbar phenomena  Patient is severely symptomatic from her profound autonomic failure, significant orthostatic blood pressure changes, essentially disabled from her symptoms,   Midodrine 45m tid, dose should be before 3 PM to avoid supine hypertension, continue fludrocortisone 0.1 mg 3 times a day, emphasized importance of increased water intake,  Whole body itching,  Much improved with gabapentin 100 mg 3 to 4 tablets each day  Chronic migraine headaches  Imitrex 100 mg as needed  Botox injection for chronic migraine prevention, injection was performed according to [place] protocol,  5 units of Botox was injected into each side, for 31 injection sites, total of 155 units  Bilateral frontalis 4 injection sites Bilateral corrugate 2 injection sites Procerus 1 injection sites. Bilateral temporalis 8 injection sites Bilateral occipitalis 6 injection sites Bilateral cervical paraspinals 4 injection sites Bilateral upper trapezius 6 injection sites  Extra 45 unites were injected into bilateral upper cervical paraspinal muscles and right temporoparietal region  YMarcial Pacas M.D. Ph.D.  [facility] [address] [place] [id number]: [phone]: [phone] CC: WFortino Sic  PA

## 2019-11-15 NOTE — Progress Notes (Signed)
**  Botox 100 units x 2 vials, NDC 0762-2633-35, Lot [id number], Exp 02/2022, office supply.//mck,rn**

## 2019-11-16 MED FILL — SUMATRIPTAN SUCC 100 MG TAB: 100 | 30 days supply | Qty: 12 | Fill #3

## 2019-11-20 MED FILL — GABAPENTIN 100 MG CAPSULE: 100 | 90 days supply | Qty: 810 | Fill #0

## 2019-11-21 MED FILL — POTASSIUM CL ER 10 MEQ TAB: 10 | 30 days supply | Qty: 120 | Fill #7

## 2019-11-21 MED FILL — FLUDROCORTISONE 0.1 MG TAB: 0.1 | 30 days supply | Qty: 90 | Fill #4

## 2019-11-23 DIAGNOSIS — R269 Unspecified abnormalities of gait and mobility: Secondary | ICD-10-CM | POA: Diagnosis not present

## 2019-11-23 DIAGNOSIS — I951 Orthostatic hypotension: Secondary | ICD-10-CM | POA: Diagnosis not present

## 2019-11-23 DIAGNOSIS — G43709 Chronic migraine without aura, not intractable, without status migrainosus: Secondary | ICD-10-CM | POA: Diagnosis not present

## 2019-11-23 MED ORDER — ONABOTULINUMTOXINA 100 UNITS IJ SOLR
200.0000 [IU] | Freq: Once | INTRAMUSCULAR | Status: AC
  Administered 2019-11-23: 200 [IU] via INTRAMUSCULAR

## 2019-11-23 NOTE — Addendum Note (Signed)
Addended by: Levert Feinstein on: 11/23/2019 01:08 PM   Modules accepted: Orders

## 2019-11-30 MED FILL — NORTRIPTYLINE HCL 10 MG CAP: 10 | 30 days supply | Qty: 60 | Fill #2

## 2019-11-30 MED FILL — ZOLPIDEM TARTRATE 10 MG TAB: 10 | 30 days supply | Qty: 30 | Fill #1

## 2019-12-11 ENCOUNTER — Other Ambulatory Visit: Payer: Self-pay | Admitting: Neurology

## 2019-12-11 MED FILL — PROPRANOLOL HCL ER 60 MG CP: 60 | 90 days supply | Qty: 90 | Fill #0

## 2019-12-21 MED FILL — buPROPion HCL ER (XL) 150 M: 150 | 30 days supply | Qty: 30 | Fill #3

## 2019-12-24 DIAGNOSIS — G901 Familial dysautonomia [Riley-Day]: Secondary | ICD-10-CM | POA: Insufficient documentation

## 2019-12-25 ENCOUNTER — Encounter: Payer: Self-pay | Admitting: Internal Medicine

## 2019-12-25 ENCOUNTER — Other Ambulatory Visit: Payer: Self-pay | Admitting: Physician Assistant

## 2019-12-25 ENCOUNTER — Ambulatory Visit: Payer: PPO | Admitting: Internal Medicine

## 2019-12-25 ENCOUNTER — Other Ambulatory Visit: Payer: Self-pay

## 2019-12-25 VITALS — BP 124/88 | HR 82 | Ht 67.0 in | Wt 210.4 lb

## 2019-12-25 DIAGNOSIS — I951 Orthostatic hypotension: Secondary | ICD-10-CM

## 2019-12-25 DIAGNOSIS — G901 Familial dysautonomia [Riley-Day]: Secondary | ICD-10-CM | POA: Diagnosis not present

## 2019-12-25 MED FILL — POTASSIUM CL ER 10 MEQ TAB: 10 | 90 days supply | Qty: 540 | Fill #0

## 2019-12-25 MED FILL — FLUDROCORTISONE 0.1 MG TAB: 0.1 | 30 days supply | Qty: 90 | Fill #5

## 2019-12-25 MED FILL — SYNTHROID 100 MCG TABLET: 100 | 45 days supply | Qty: 45 | Fill #5

## 2019-12-25 MED FILL — ZOLPIDEM TARTRATE 10 MG TAB: 10 | 30 days supply | Qty: 30 | Fill #2

## 2019-12-25 NOTE — Progress Notes (Signed)
ELECTROPHYSIOLOGY CONSULT NOTE  Patient ID: Mallory Durham, MRN: [id number], DOB/AGE: 07/10/1940 79 y.o. Admit date: (Not on file) Date of Consult: 12/25/2019  Primary Physician: Clide Dales, PA Primary Cardiologist: Marliss Czar     Mallory Durham is a 79 y.o. female who is being seen today for the evaluation of orthostatic intolerance  at the request of [facility].    HPI [name] is a 79 y.o. female with a history dating back to 2019 of orthostatic hypotension.  Unfortunately, we have scant data available medical record on.  Initial evaluation 1/19 demonstrated a blood pressure of 97--60 with standing.  Others include 148  -- 128 on a number of occasions but nothing more severe than that.  She has had longstanding history of syncope which was markedly attenuated by the introduction of fludrocortisone.  Mestinon was used adjunctively subsequently discontinued for reasons not clear and midodrine has been used in addition.  Medications were associated with a significant improvement in symptoms both decreasing syncope as well as decreasing orthostatic fatigue.  More recently the fatigue has been again recurring and relentless.  She is unable to stand to brush her teeth and/or to make a sandwich.  No recent syncope.  Showers on a chair.  No constipation.  Dry mouth dry eyes.  DATE TEST EF   2/19 Echo   55-60 %               Date Cr K Hgb  2/21 1.08 4.9 13.1            Past Medical History:  Diagnosis Date  . Anxiety   . Confusion, hx of, without neuro findings   . Depression   . Hx of vertigo   . Hyperlipidemia   . Hypothyroid   . Left shoulder pain   . Migraines   . Neck pain   . Orthostatic hypotension   . Renal insufficiency       Surgical History:  Past Surgical History:  Procedure Laterality Date  . ABDOMINAL HYSTERECTOMY    . ABDOMINAL HYSTERECTOMY    . BREAST LUMPECTOMY    . CHOLECYSTECTOMY    . JOINT REPLACEMENT    . THYROID SURGERY    .  TONSILLECTOMY       Home Meds: Current Meds  Medication Sig  . aspirin-acetaminophen-caffeine (EXCEDRIN MIGRAINE) 250-250-65 MG tablet Take by mouth every 6 (six) hours as needed for headache.  . botulinum toxin Type A (BOTOX) 100 units SOLR injection Inject IM every 3 months by MD in the office  . buPROPion (WELLBUTRIN XL) 150 MG 24 hr tablet Take 1 tablet (150 mg total) by mouth daily.  . Cholecalciferol (D3-1000 PO) Take 1 tablet by mouth daily.  . fludrocortisone (FLORINEF) 0.1 MG tablet TAKE 1 TABLET (0.1 MG TOTAL) BY MOUTH 3 (THREE) TIMES DAILY.  Marland Kitchen gabapentin (NEURONTIN) 100 MG capsule Take 3 capsules (300 mg total) by mouth 3 (three) times daily. Take one cap in am, one cap midday, two cap in pm.  . midodrine (PROAMATINE) 10 MG tablet Take 1 tablet (10 mg total) by mouth 3 (three) times daily. Pt needs to keep appt with provider in June for further refills  . Multiple Vitamins-Minerals (PRESERVISION AREDS 2) CAPS Take 2 capsules by mouth.  . nortriptyline (PAMELOR) 10 MG capsule TAKE 2 CAPSULES (20 MG TOTAL) BY MOUTH AT BEDTIME.  Marland Kitchen ondansetron (ZOFRAN) 4 MG tablet TAKE 1 TABLET (4 MG TOTAL) BY MOUTH EVERY 8 (EIGHT) HOURS AS NEEDED  FOR NAUSEA OR VOMITING.  . potassium chloride (KLOR-CON) 10 MEQ tablet Take 3 tablets (30 mEq total) by mouth 2 (two) times daily.  . Probiotic Product (PROBIOTIC PO) Take 1 capsule by mouth as directed.  . propranolol ER (INDERAL LA) 60 MG 24 hr capsule TAKE 1 CAPSULE BY MOUTH AT BEDTIME  . sertraline (ZOLOFT) 100 MG tablet Take 100 mg by mouth daily.  . simvastatin (ZOCOR) 20 MG tablet Take 1 tablet (20 mg total) by mouth every evening.  . SUMAtriptan (IMITREX) 100 MG tablet TAKE 1 TAB BY MOUTH AT ONSET OF MIGRAINE. MAY REPEAT IN 2 HRS, IF NEEDED. MAX DOSE: 2 TABS/DAY. THIS IS A 30 DAY PRESCRIPTION.  . SYNTHROID 100 MCG tablet Take 1 tablet (100 mcg total) by mouth daily before breakfast.  . tiZANidine (ZANAFLEX) 2 MG tablet Take 1 tablet (2 mg total) by  mouth every 8 (eight) hours as needed for muscle spasms.  . vitamin B-12 (CYANOCOBALAMIN) 1000 MCG tablet Take 1,000 mcg by mouth daily.  Marland Kitchen zolpidem (AMBIEN) 10 MG tablet TAKE 1/2 TO 1 TABLET BY MOUTH AT BEDTIME AS NEEDED    Allergies: No Known Allergies  Social History   Socioeconomic History  . Marital status: Married    Spouse name: Not on file  . Number of children: 2  . Years of education: 16  . Highest education level: Bachelor's degree (e.g., BA, AB, BS)  Occupational History    Employer: RETIRED  Tobacco Use  . Smoking status: Never Smoker  . Smokeless tobacco: Never Used  Vaping Use  . Vaping Use: Never used  Substance and Sexual Activity  . Alcohol use: No  . Drug use: No  . Sexual activity: Not on file  Other Topics Concern  . Not on file  Social History Narrative   Lives at home alone.   Right-handed.   2-3 cups caffeine per day.   Social Determinants of Health   Financial Resource Strain:   . Difficulty of Paying Living Expenses:   Food Insecurity:   . Worried About Programme researcher, broadcasting/film/video in the Last Year:   . Barista in the Last Year:   Transportation Needs:   . Freight forwarder (Medical):   Marland Kitchen Lack of Transportation (Non-Medical):   Physical Activity:   . Days of Exercise per Week:   . Minutes of Exercise per Session:   Stress:   . Feeling of Stress :   Social Connections:   . Frequency of Communication with Friends and Family:   . Frequency of Social Gatherings with Friends and Family:   . Attends Religious Services:   . Active Member of Clubs or Organizations:   . Attends Banker Meetings:   Marland Kitchen Marital Status:   Intimate Partner Violence:   . Fear of Current or Ex-Partner:   . Emotionally Abused:   Marland Kitchen Physically Abused:   . Sexually Abused:      Family History  Problem Relation Age of Onset  . Heart disease Mother        Angina  . Cancer Mother   . Cancer Father   . Heart disease Father   . Cancer Maternal Aunt       ROS:  Please see the history of present illness.     All other systems reviewed and negative.    Physical Exam: Blood pressure 124/88, pulse 82, height 5\' 7"  (1.702 m), weight 210 lb 6.4 oz (95.4 kg), SpO2 98 %. General: Well  developed, well nourished female in no acute distress. Head: Normocephalic, atraumatic, sclera non-icteric, no xanthomas, nares are without discharge. EENT: normal  Lymph Nodes:  none Neck: Negative for carotid bruits. JVD not elevated. Back:without scoliosis kyphosis Lungs: Clear bilaterally to auscultation without wheezes, rales, or rhonchi. Breathing is unlabored. Heart: RRR with S1 S2. No  murmur . No rubs, or gallops appreciated. Abdomen: Soft, non-tender, non-distended with normoactive bowel sounds. No hepatomegaly. No rebound/guarding. No obvious abdominal masses. Msk:  Strength and tone appear normal for age. Extremities: No clubbing or cyanosis. No  edema.  Distal pedal pulses are 2+ and equal bilaterally. Skin: Warm and Dry Neuro: Alert and oriented X 3. CN III-XII intact Grossly normal sensory and motor function . Psych:  Responds to questions appropriately with a normal affect.      Labs: Cardiac Enzymes No results for input(s): CKTOTAL, CKMB, TROPONINI in the last 72 hours. CBC Lab Results  Component Value Date   WBC 9.2 05/15/2019   HGB 13.1 05/15/2019   HCT 38.6 05/15/2019   MCV 89 05/15/2019   PLT 307 05/15/2019   PROTIME: No results for input(s): LABPROT, INR in the last 72 hours. Chemistry No results for input(s): NA, K, CL, CO2, BUN, CREATININE, CALCIUM, PROT, BILITOT, ALKPHOS, ALT, AST, GLUCOSE in the last 168 hours.  Invalid input(s): LABALBU Lipids Lab Results  Component Value Date   CHOL 151 10/03/2013   HDL 61 10/03/2013   LDLCALC 70 10/03/2013   TRIG 100 10/03/2013   BNP Pro B Natriuretic peptide (BNP)  Date/Time Value Ref Range Status  02/02/2017 02:00 PM 246.0 (H) 0.0 - 100.0 pg/mL Final   Thyroid Function  Tests: No results for input(s): TSH, T4TOTAL, T3FREE, THYROIDAB in the last 72 hours.  Invalid input(s): FREET3 Miscellaneous No results found for: DDIMER  Radiology/Studies:  No results found.  EKG: Sinus at 82 with frequent atrial ectopy Intervals 19/08/42   Assessment and Plan:   Orthostatic hypotension/dysautonomia  Dementia  Headaches    Extensive evaluation by Dr. Debarah Crape was reviewed.  Amyloid is a possibility that I do not see excluded.  We will undertake SPEP and IEP as well as a pyrophosphate scan.    .We discussed the physiology of orthostatic intolerance including gravitational fluid shifts and the impact of hypertensive vascular disease on orthostasis and treatment options.  We discussed pharmacological options including midodrine, pyridostigmine, fludrocortisone.  We discussed nonpharmacological options including raising the HOB, isometric contraction upon standing, abdominal binders  thigh sleeves. We emphasized the importance of recognizing the prodrome and sitting prior to falling, safety in the shower and in the bathroom and the avoidance of dehydration   We will begin with a nonpharmacological approach with abdominal binder, thigh sleeves and raising the [facility]  It was a possible to do inspiratory expiratory ratios because of atrial ectopy.  However, I was not convinced that there was any significant variation.  She however does not have constipation which is frequently seen with systemic autonomic insufficiency.  She does have some bladder issues dry mouth and dry eyes.  I am not sure that we will find a reversible cause and I will discuss with Dr. Debarah Crape pharmacotherapies and if in fact this is autonomic failure, I have explained to the family that this is an irreversible and inexorably progressive condition.Sherryl Manges

## 2019-12-25 NOTE — Patient Instructions (Signed)
Medication Instructions:  Your physician recommends that you continue on your current medications as directed. Please refer to the Current Medication list given to you today.  *If you need a refill on your cardiac medications before your next appointment, please call your pharmacy*   Lab Work: None ordered.  If you have labs (blood work) drawn today and your tests are completely normal, you will receive your results only by: Marland Kitchen MyChart Message (if you have MyChart) OR . A paper copy in the mail If you have any lab test that is abnormal or we need to change your treatment, we will call you to review the results.   Testing/Procedures: None ordered.    Follow-Up: At [facility], you and your health needs are our priority.  As part of our continuing mission to provide you with exceptional heart care, we have created designated Provider Care Teams.  These Care Teams include your primary Cardiologist (physician) and Advanced Practice Providers (APPs -  Physician Assistants and Nurse Practitioners) who all work together to provide you with the care you need, when you need it.  We recommend signing up for the patient portal called "MyChart".  Sign up information is provided on this After Visit Summary.  MyChart is used to connect with patients for Virtual Visits (Telemedicine).  Patients are able to view lab/test results, encounter notes, upcoming appointments, etc.  Non-urgent messages can be sent to your provider as well.   To learn more about what you can do with MyChart, go to [email or web address].    Your next appointment:   Follow up with Dr Graciela Husbands as needed  Please purchase abdominal binder, thigh sleeves and bed risers as discussed by Dr Graciela Husbands.

## 2020-01-01 MED FILL — SUMATRIPTAN SUCC 100 MG TAB: 100 | 30 days supply | Qty: 12 | Fill #4

## 2020-01-11 MED FILL — NORTRIPTYLINE HCL 10 MG CAP: 10 | 30 days supply | Qty: 60 | Fill #3

## 2020-01-24 ENCOUNTER — Other Ambulatory Visit: Payer: Self-pay | Admitting: Neurology

## 2020-01-24 MED FILL — ZOLPIDEM TARTRATE 10 MG TAB: 10 | 30 days supply | Qty: 30 | Fill #3

## 2020-01-24 MED FILL — tiZANidine HCL 2 MG TABS: 2 | 10 days supply | Qty: 30 | Fill #0

## 2020-02-05 MED FILL — SUMATRIPTAN SUCC 100 MG TAB: 100 | 30 days supply | Qty: 12 | Fill #5

## 2020-02-13 ENCOUNTER — Ambulatory Visit: Payer: PPO | Admitting: Neurology

## 2020-02-18 ENCOUNTER — Other Ambulatory Visit (HOSPITAL_BASED_OUTPATIENT_CLINIC_OR_DEPARTMENT_OTHER): Payer: Self-pay | Admitting: Family Medicine

## 2020-02-18 DIAGNOSIS — J0141 Acute recurrent pansinusitis: Secondary | ICD-10-CM | POA: Diagnosis not present

## 2020-02-18 DIAGNOSIS — J060 Acute laryngopharyngitis: Secondary | ICD-10-CM | POA: Diagnosis not present

## 2020-02-18 DIAGNOSIS — J90 Pleural effusion, not elsewhere classified: Secondary | ICD-10-CM | POA: Diagnosis not present

## 2020-02-18 DIAGNOSIS — J208 Acute bronchitis due to other specified organisms: Secondary | ICD-10-CM | POA: Diagnosis not present

## 2020-02-18 DIAGNOSIS — Z20822 Contact with and (suspected) exposure to covid-19: Secondary | ICD-10-CM | POA: Diagnosis not present

## 2020-02-18 DIAGNOSIS — R112 Nausea with vomiting, unspecified: Secondary | ICD-10-CM | POA: Diagnosis not present

## 2020-02-18 DIAGNOSIS — R1110 Vomiting, unspecified: Secondary | ICD-10-CM | POA: Diagnosis not present

## 2020-02-18 DIAGNOSIS — B9689 Other specified bacterial agents as the cause of diseases classified elsewhere: Secondary | ICD-10-CM | POA: Diagnosis not present

## 2020-02-18 DIAGNOSIS — R0989 Other specified symptoms and signs involving the circulatory and respiratory systems: Secondary | ICD-10-CM | POA: Diagnosis not present

## 2020-02-18 DIAGNOSIS — R059 Cough, unspecified: Secondary | ICD-10-CM | POA: Diagnosis not present

## 2020-02-18 DIAGNOSIS — R110 Nausea: Secondary | ICD-10-CM | POA: Diagnosis not present

## 2020-02-18 MED FILL — FLUDROCORTISONE 0.1 MG TAB: 0.1 | 30 days supply | Qty: 90 | Fill #6

## 2020-02-18 MED FILL — buPROPion HCL ER (XL) 150 M: 150 | 30 days supply | Qty: 30 | Fill #4

## 2020-02-18 MED FILL — NORTRIPTYLINE HCL 10 MG CAP: 10 | 30 days supply | Qty: 60 | Fill #4

## 2020-02-18 MED FILL — AMOX-CLAV 875-125 MG TABLET: 875-125 | 7 days supply | Qty: 14 | Fill #0

## 2020-02-18 MED FILL — SYNTHROID 100 MCG TABLET: 100 | 30 days supply | Qty: 30 | Fill #0

## 2020-02-18 MED FILL — HYDROMET SYRUP: 5-1.5 | 6 days supply | Qty: 120 | Fill #0

## 2020-02-18 MED FILL — ONDANSETRON ODT 8 MG TABLET: 8 | 7 days supply | Qty: 21 | Fill #0

## 2020-02-18 MED FILL — predniSONE 10 MG TABS: 10 | 6 days supply | Qty: 21 | Fill #0

## 2020-02-19 ENCOUNTER — Other Ambulatory Visit: Payer: Self-pay | Admitting: Oncology

## 2020-02-19 DIAGNOSIS — U071 COVID-19: Secondary | ICD-10-CM

## 2020-02-19 NOTE — Progress Notes (Signed)
I connected by phone with Mallory Durham to discuss the potential use of an new treatment for mild to moderate COVID-19 viral infection in non-hospitalized patients.   This patient is a age/sex that meets the FDA criteria for Emergency Use Authorization of casirivimab\imdevimab.  Has a (+) direct SARS-CoV-2 viral test result 1. Has mild or moderate COVID-19  2. Is ? 79 years of age and weighs ? 40 kg 3. Is NOT hospitalized due to COVID-19 4. Is NOT requiring oxygen therapy or requiring an increase in baseline oxygen flow rate due to COVID-19 5. Is within 10 days of symptom onset 6. Has at least one of the high risk factor(s) for progression to severe COVID-19 and/or hospitalization as defined in EUA. Specific high risk criteria : Past Medical History:  Diagnosis Date  . Anxiety   . Confusion, hx of, without neuro findings   . Depression   . Hx of vertigo   . Hyperlipidemia   . Hypothyroid   . Left shoulder pain   . Migraines   . Neck pain   . Orthostatic hypotension   . Renal insufficiency   ?  ?    Symptom onset  02/16/2020   I have spoken and communicated the following to the patient or parent/caregiver:   1. FDA has authorized the emergency use of casirivimab\imdevimab for the treatment of mild to moderate COVID-19 in adults and pediatric patients with positive results of direct SARS-CoV-2 viral testing who are 62 years of age and older weighing at least 40 kg, and who are at high risk for progressing to severe COVID-19 and/or hospitalization.   2. The significant known and potential risks and benefits of casirivimab\imdevimab, and the extent to which such potential risks and benefits are unknown.   3. Information on available alternative treatments and the risks and benefits of those alternatives, including clinical trials.   4. Patients treated with casirivimab\imdevimab should continue to self-isolate and use infection control measures (e.g., wear mask, isolate, social  distance, avoid sharing personal items, clean and disinfect "high touch" surfaces, and frequent handwashing) according to CDC guidelines.    5. The patient or parent/caregiver has the option to accept or refuse casirivimab\imdevimab .   After reviewing this information with the patient, The patient agreed to proceed with receiving casirivimab\imdevimab infusion and will be provided a copy of the Fact sheet prior to receiving the infusion.Mignon Pine, AGNP-C [phone] (Infusion Center Hotline)

## 2020-02-20 ENCOUNTER — Ambulatory Visit: Payer: Self-pay | Admitting: Neurology

## 2020-02-20 ENCOUNTER — Ambulatory Visit (HOSPITAL_COMMUNITY)
Admission: RE | Admit: 2020-02-20 | Discharge: 2020-02-20 | Disposition: A | Discharge status: Home / Self Care | Payer: Medicare Other | Source: Ambulatory Visit | Attending: Pulmonary Disease | Admitting: Pulmonary Disease

## 2020-02-20 DIAGNOSIS — U071 COVID-19: Secondary | ICD-10-CM | POA: Insufficient documentation

## 2020-02-20 DIAGNOSIS — Z23 Encounter for immunization: Secondary | ICD-10-CM | POA: Insufficient documentation

## 2020-02-20 MED ORDER — EPINEPHRINE 0.3 MG/0.3ML IJ SOAJ: 0.3000 mg | Freq: Once | INTRAMUSCULAR | Status: DC | PRN

## 2020-02-20 MED ORDER — METHYLPREDNISOLONE SODIUM SUCC 125 MG IJ SOLR: 125.0000 mg | Freq: Once | INTRAMUSCULAR | Status: DC | PRN

## 2020-02-20 MED ORDER — SODIUM CHLORIDE 0.9 % IV SOLN
1200.0000 mg | Freq: Once | INTRAVENOUS | Status: AC
  Administered 2020-02-20: 1200 mg via INTRAVENOUS

## 2020-02-20 MED ORDER — DIPHENHYDRAMINE HCL 50 MG/ML IJ SOLN: 50.0000 mg | Freq: Once | INTRAMUSCULAR | Status: DC | PRN

## 2020-02-20 MED ORDER — SODIUM CHLORIDE 0.9 % IV SOLN: INTRAVENOUS | Status: DC | PRN

## 2020-02-20 MED ORDER — FAMOTIDINE IN NACL 20-0.9 MG/50ML-% IV SOLN: 20.0000 mg | Freq: Once | INTRAVENOUS | Status: DC | PRN

## 2020-02-20 MED ORDER — ALBUTEROL SULFATE HFA 108 (90 BASE) MCG/ACT IN AERS: 2.0000 | INHALATION_SPRAY | Freq: Once | RESPIRATORY_TRACT | Status: DC | PRN

## 2020-02-20 NOTE — Discharge Instructions (Signed)

## 2020-02-20 NOTE — Progress Notes (Signed)
  Diagnosis: COVID-19  Physician: Patrick Wright, MD  Procedure: [facility] Med: casirivimab\imdevimab infusion - Provided patient with casirivimab\imdevimab fact sheet for patients, parents and caregivers prior to infusion.  Complications: No immediate complications noted.  Discharge: Discharged home   Mallory Durham 02/20/2020   

## 2020-02-25 MED FILL — ZOLPIDEM TARTRATE 10 MG TAB: 10 | 30 days supply | Qty: 30 | Fill #4

## 2020-02-26 ENCOUNTER — Telehealth: Payer: Self-pay | Admitting: Neurology

## 2020-02-26 ENCOUNTER — Other Ambulatory Visit (HOSPITAL_BASED_OUTPATIENT_CLINIC_OR_DEPARTMENT_OTHER): Payer: Self-pay | Admitting: Neurology

## 2020-02-26 MED FILL — FLUDROCORTISONE 0.1 MG TAB: 0.1 | 30 days supply | Qty: 90 | Fill #0

## 2020-02-26 NOTE — Telephone Encounter (Signed)
Pt called stating that she is needing to speak to the RN. Pt would not give any information except that she is needing to discuss her medication. Pt did not mention which medication. Please advise.

## 2020-02-26 NOTE — Telephone Encounter (Signed)
I spoke to the patient. She just moved two Fridays ago. On top of this chore, she developed COVID-19 along with her daughter and son-in-law. She was having some breathing issues and saw her PCP. She received Predisone and the monoclonal antibodies infusion. She is feeling better now.   Her issue today is that she has misplaced/lost her Florinef prescription. The pharmacy is unable to fill it early without an approval from our office. I called [facility] at [phone] and spoke to [place]. She will process an override and get her prescription ready for pick up.

## 2020-02-28 DIAGNOSIS — E782 Mixed hyperlipidemia: Secondary | ICD-10-CM | POA: Diagnosis not present

## 2020-02-28 DIAGNOSIS — M8589 Other specified disorders of bone density and structure, multiple sites: Secondary | ICD-10-CM | POA: Diagnosis not present

## 2020-02-28 DIAGNOSIS — E890 Postprocedural hypothyroidism: Secondary | ICD-10-CM | POA: Diagnosis not present

## 2020-02-28 DIAGNOSIS — E559 Vitamin D deficiency, unspecified: Secondary | ICD-10-CM | POA: Diagnosis not present

## 2020-03-14 MED FILL — GABAPENTIN 100 MG CAPSULE: 100 | 90 days supply | Qty: 810 | Fill #1

## 2020-03-14 MED FILL — SYNTHROID 100 MCG TABLET: 100 | 30 days supply | Qty: 30 | Fill #1

## 2020-03-14 MED FILL — PROPRANOLOL HCL ER 60 MG CP: 60 | 90 days supply | Qty: 90 | Fill #1

## 2020-03-14 MED FILL — SUMATRIPTAN SUCC 100 MG TAB: 100 | 30 days supply | Qty: 12 | Fill #6

## 2020-03-25 MED FILL — ZOLPIDEM TARTRATE 10 MG TAB: 10 | 30 days supply | Qty: 30 | Fill #5

## 2020-03-25 MED FILL — NORTRIPTYLINE HCL 10 MG CAP: 10 | 30 days supply | Qty: 60 | Fill #5

## 2020-04-01 ENCOUNTER — Other Ambulatory Visit: Payer: PPO

## 2020-04-02 ENCOUNTER — Other Ambulatory Visit: Payer: Self-pay | Admitting: Neurology

## 2020-04-02 ENCOUNTER — Encounter: Payer: Self-pay | Admitting: Neurology

## 2020-04-02 ENCOUNTER — Ambulatory Visit: Payer: PPO | Admitting: Neurology

## 2020-04-02 ENCOUNTER — Other Ambulatory Visit: Payer: Self-pay

## 2020-04-02 ENCOUNTER — Other Ambulatory Visit (INDEPENDENT_AMBULATORY_CARE_PROVIDER_SITE_OTHER): Payer: PPO

## 2020-04-02 VITALS — BP 105/58 | HR 62 | Ht 67.0 in | Wt 208.0 lb

## 2020-04-02 DIAGNOSIS — G43709 Chronic migraine without aura, not intractable, without status migrainosus: Secondary | ICD-10-CM | POA: Diagnosis not present

## 2020-04-02 DIAGNOSIS — I951 Orthostatic hypotension: Secondary | ICD-10-CM | POA: Diagnosis not present

## 2020-04-02 DIAGNOSIS — E890 Postprocedural hypothyroidism: Secondary | ICD-10-CM | POA: Diagnosis not present

## 2020-04-02 DIAGNOSIS — G901 Familial dysautonomia [Riley-Day]: Secondary | ICD-10-CM

## 2020-04-02 DIAGNOSIS — L299 Pruritus, unspecified: Secondary | ICD-10-CM

## 2020-04-02 DIAGNOSIS — R269 Unspecified abnormalities of gait and mobility: Secondary | ICD-10-CM

## 2020-04-02 LAB — TSH: TSH: 0.43 u[IU]/mL (ref 0.35–4.50)

## 2020-04-02 LAB — T4, FREE: Free T4: 1.32 ng/dL (ref 0.60–1.60)

## 2020-04-02 MED ORDER — ONABOTULINUMTOXINA 100 UNITS IJ SOLR
200.0000 [IU] | Freq: Once | INTRAMUSCULAR | Status: AC
  Administered 2020-04-02: 200 [IU] via INTRAMUSCULAR

## 2020-04-02 MED ORDER — FLUDROCORTISONE ACETATE 0.1 MG PO TABS: 0.1000 mg | ORAL_TABLET | Freq: Three times a day (TID) | ORAL | 4 refills | Status: DC

## 2020-04-02 MED FILL — FLUDROCORTISONE 0.1 MG TAB: 0.1 | 90 days supply | Qty: 270 | Fill #0

## 2020-04-02 NOTE — Progress Notes (Signed)
PATIENT: Mallory Durham DOB: 10-Nov-1940  Chief Complaint  Patient presents with  . Procedure    Botox injection  . Room new room    Daughter Mallory Durham in room     HISTORICAL  Mallory Durham is a 79 year old female, seen in refer by primary care PA Fortino Sic, for evaluation of migraine headaches, chronic neck pain, initial evaluation was on June 01, 2017.  Reviewed and summarized the referring note, she has past medical history of hypothyroidism, on supplement, hyperlipidemia, chronic neck pain, chronic renal insufficiency,  Since 2016, she noticed progressive worsening dizziness, to the point now, she sits down most of the time, is not able to exercise, in addition, she is under a lot of stress, tried to see her husband at the nursing home on a daily basis.  She drinks 2 of 12 ounce of water bottle every day, but she complains lightheadedness shortly after standing up, mild improvement walking short distance, but with prolonged walking and standing, she would develop this washout sensation, lightheadedness, as if she is going to faint, she has to sit down to improve her symptoms.  During today's examination, she was found to have significant orthostatic blood pressure change in the setting of already low blood pressure, lying down blood pressure 97/70, heart rate of 83, standing up systolic blood pressure was 50 or less, required multiple measurement to register, she does become symptomatic, especially after standing up for more than 3 minutes.  Her symptoms gradually improved after lying flat.  Previously she was given prescription of midodrine 2.5 mg to get in the early morning, and before bed, there was no significant improvement noted.  She also noted gradual onset memory loss, difficulty focusing, she has history of migraine headache, increased migraine over the past few months, she could not tolerate Imitrex 100 mg as needed due to heart palpitation, she usually take  half of 100 mg tablets, if needed, she may repeat in couple hours, which take away the headache in few hours,  She is quite disabled by her dizziness, has to stay seated most of the time, she denies bilateral lower extremity swelling, no significant sensory loss.  UPDATE July 18 2017: MRI of the brain in February 2019, generalized atrophy, especially at the right perisylvian fissure area, mild supratentorium small vessel disease, there is no acute abnormality.  Laboratory evaluation showed A1c of 5.2, normal negative ANA, copper, C-reactive protein, ESR, RPR, B12, CMP, lipid profile, BNP was elevated 246   Significant low vitamin D 5.2,  Echocardiogram showed ejection fraction of 55-60%, wall motion was normal, aortic valve showed no stenosis, ventricular size was normal, systolic function was normal  She is taking gabapentin 686m bid, midodrine 10 mg 3 times a day, last dose was before she goes to bed, she also complains of chronic insomnia, taking Ambien 10 mg at nighttime  She still has depression, anxiety, she has frequent headaches, once or twice each day, she has been 50 mg as needed, 50 mg or Maxalt as needed for headaches, which helps her headache some  UPDATE November 01 2017: She is accompanied by her daughter Mallory Durham today's clinical visit, functional status has much declined, despite midodrine 10 mg 3 times a day, and the Mestinon 60 mg 3 times a day, she complains of lightheadedness, dizziness when getting up just a few minutes, today she had significant orthostatic pressure dropped fBHAL/937/90to systolic 60 after standing up, difficult to register diastolic pressure.   She complains lightheadedness  fainting sensation after lying down for a few minutes,blood pressure points back to 120/80, her symptoms has much improved  She also complains of frequent migraine headaches, sometimes woke up with migraine headaches, taking Imitrex as needed  Chronic insomnia, Ambien 5 to 10 mg as  needed  UPDATE Sept 9 2019: She is now taking fludrocortisone 0.1 mg 3 times a day, which has really helped her symptoms, along with Mestinon 60 mg daily, she is scheduled to see [facility] on February 07, 2018,  She complains of chronic migraine headaches for many years,3-4 times each week, right lateralized severe pounding headaches, with associated light noise sensitivity, Imitrex 100 mg as needed was helpful, still her headache last about 6 hours,  UPDATE May 02 2018: She is now taking fludrocortisone 0.3 mg 3 times a day, Mestinon 60 mg 3 times a day, midodrine was increased from 2.5 to 5 mg 3 times a day, orthostatic symptoms has much improved,  She has almost daily headache, more on right temporal region, imitrex 139m 1/2 tab daily,   Reported 40% improvement with Botox injection, she has less headache, less severe, but benefit short lasting,  UPDATE Apr 02 2020: She suffered COVID-19 in early October 2021, recovering well, still complains of significant fatigue, orthostatic dizziness, fainting spells,  Reviewed evaluation by cardiologist Dr. SVirl Axeon December 25, 2019, agree with current medication management, no longer take Mestinon, f still on fludrocortisone 0.1 mg 3 times a day, midodrine 10 mg 2-3 times a day,  Skin itching is well managed by gabapentin 300 mg 3 times a day, only occasionally Benadryl,  Migraine headache is overall under good control, Botox has really helped,   REVIEW OF SYSTEMS: Full 14 system review of systems performed and notable only for above  All rest review of system were negative  ALLERGIES: No Known Allergies   HOME MEDICATIONS: Current Outpatient Medications  Medication Sig Dispense Refill  . aspirin-acetaminophen-caffeine (EXCEDRIN MIGRAINE) 250-250-65 MG tablet Take by mouth every 6 (six) hours as needed for headache.    . botulinum toxin Type A (BOTOX) 100 units SOLR injection Inject IM every 3 months by MD in the  office 2 vial 3  . buPROPion (WELLBUTRIN XL) 150 MG 24 hr tablet Take 1 tablet (150 mg total) by mouth daily. 30 tablet 0  . Cholecalciferol (D3-1000 PO) Take 1 tablet by mouth daily.    . fludrocortisone (FLORINEF) 0.1 MG tablet Take 1 tablet (0.1 mg total) by mouth 3 (three) times daily. 270 tablet 4  . gabapentin (NEURONTIN) 100 MG capsule Take 3 capsules (300 mg total) by mouth 3 (three) times daily. Take one cap in am, one cap midday, two cap in pm. 810 capsule 4  . midodrine (PROAMATINE) 10 MG tablet Take 1 tablet (10 mg total) by mouth 3 (three) times daily. Pt needs to keep appt with provider in June for further refills 270 tablet 4  . Multiple Vitamins-Minerals (PRESERVISION AREDS 2) CAPS Take 2 capsules by mouth.    . nortriptyline (PAMELOR) 10 MG capsule TAKE 2 CAPSULES (20 MG TOTAL) BY MOUTH AT BEDTIME. 60 capsule 11  . ondansetron (ZOFRAN) 4 MG tablet TAKE 1 TABLET (4 MG TOTAL) BY MOUTH EVERY 8 (EIGHT) HOURS AS NEEDED FOR NAUSEA OR VOMITING. 20 tablet 1  . potassium chloride (KLOR-CON) 10 MEQ tablet Take 3 tablets (30 mEq total) by mouth 2 (two) times daily. 540 tablet 3  . Probiotic Product (PROBIOTIC PO) Take 1 capsule by mouth  as directed.    . propranolol ER (INDERAL LA) 60 MG 24 hr capsule TAKE 1 CAPSULE BY MOUTH AT BEDTIME 90 capsule 3  . sertraline (ZOLOFT) 100 MG tablet Take 100 mg by mouth daily.    . simvastatin (ZOCOR) 20 MG tablet Take 1 tablet (20 mg total) by mouth every evening. 90 tablet 1  . SUMAtriptan (IMITREX) 100 MG tablet TAKE 1 TAB BY MOUTH AT ONSET OF MIGRAINE. MAY REPEAT IN 2 HRS, IF NEEDED. MAX DOSE: 2 TABS/DAY. THIS IS A 30 DAY PRESCRIPTION. 12 tablet 11  . SYNTHROID 100 MCG tablet Take 1 tablet (100 mcg total) by mouth daily before breakfast. 30 tablet 11  . tiZANidine (ZANAFLEX) 2 MG tablet TAKE 1 TABLET (2 MG TOTAL) BY MOUTH EVERY 8 (EIGHT) HOURS AS NEEDED FOR MUSCLE SPASMS. 30 tablet 1  . vitamin B-12 (CYANOCOBALAMIN) 1000 MCG tablet Take 1,000 mcg by  mouth daily.    Marland Kitchen zolpidem (AMBIEN) 10 MG tablet TAKE 1/2 TO 1 TABLET BY MOUTH AT BEDTIME AS NEEDED 30 tablet 5   No current facility-administered medications for this visit.    PAST MEDICAL HISTORY: Past Medical History:  Diagnosis Date  . 2019 novel coronavirus disease (COVID-19)   . Anxiety   . Confusion, hx of, without neuro findings   . Depression   . Hx of vertigo   . Hyperlipidemia   . Hypothyroid   . Left shoulder pain   . Migraines   . Neck pain   . Orthostatic hypotension   . Renal insufficiency     PAST SURGICAL HISTORY: Past Surgical History:  Procedure Laterality Date  . ABDOMINAL HYSTERECTOMY    . ABDOMINAL HYSTERECTOMY    . BREAST LUMPECTOMY    . CHOLECYSTECTOMY    . JOINT REPLACEMENT    . THYROID SURGERY    . TONSILLECTOMY      FAMILY HISTORY: Family History  Problem Relation Age of Onset  . Heart disease Mother        Angina  . Cancer Mother   . Cancer Father   . Heart disease Father   . Cancer Maternal Aunt     SOCIAL HISTORY:  Social History   Socioeconomic History  . Marital status: Married    Spouse name: Not on file  . Number of children: 2  . Years of education: 16  . Highest education level: Bachelor's degree (e.g., BA, AB, BS)  Occupational History    Employer: RETIRED  Tobacco Use  . Smoking status: Never Smoker  . Smokeless tobacco: Never Used  Vaping Use  . Vaping Use: Never used  Substance and Sexual Activity  . Alcohol use: No  . Drug use: No  . Sexual activity: Not on file  Other Topics Concern  . Not on file  Social History Narrative   Lives at home alone.   Right-handed.   1-2 cups caffeine per day.   Social Determinants of Health   Financial Resource Strain:   . Difficulty of Paying Living Expenses: Not on file  Food Insecurity:   . Worried About Charity fundraiser in the Last Year: Not on file  . Ran Out of Food in the Last Year: Not on file  Transportation Needs:   . Lack of Transportation  (Medical): Not on file  . Lack of Transportation (Non-Medical): Not on file  Physical Activity:   . Days of Exercise per Week: Not on file  . Minutes of Exercise per Session: Not on file  Stress:   . Feeling of Stress : Not on file  Social Connections:   . Frequency of Communication with Friends and Family: Not on file  . Frequency of Social Gatherings with Friends and Family: Not on file  . Attends Religious Services: Not on file  . Active Member of Clubs or Organizations: Not on file  . Attends Archivist Meetings: Not on file  . Marital Status: Not on file  Intimate Partner Violence:   . Fear of Current or Ex-Partner: Not on file  . Emotionally Abused: Not on file  . Physically Abused: Not on file  . Sexually Abused: Not on file     PHYSICAL EXAM Blood pressure sitting down 140/90, standing up 90/60, Body mass index is 32.58 kg/m.  PHYSICAL EXAMNIATION:  Gen: NAD, conversant, well nourised, obese, well groomed                       NEUROLOGICAL EXAM:  MENTAL STATUS: Speech/cognition: Awake alert oriented to history taking and casual conversation   CRANIAL NERVES: CN II: Visual fields are full to confrontation. Pupils are round equal and briskly reactive to light. CN III, IV, VI: extraocular movement are normal. No ptosis. CN V: Facial sensation is intact to pinprick in all 3 divisions bilaterally. Corneal responses are intact.  CN VII: Face is symmetric with normal eye closure and smile. CN VIII: Hearing is normal to rubbing fingers CN IX, X: Palate elevates symmetrically. Phonation is normal. CN XI: Head turning and shoulder shrug are intact  MOTOR: There is no pronator drift of out-stretched arms. Muscle bulk and tone are normal. Muscle strength is normal.  REFLEXES: Reflexes are hypoactive and symmetric at the biceps, triceps, knees, and ankles. Plantar responses are flexor.  SENSORY: Mildly less dependent sensory changes to  pinprick.  COORDINATION: No limb or truncal ataxia noted  GAIT/STANCE: She can get up without pushing on chair arm, steady, Romberg is absent.   DIAGNOSTIC DATA (LABS, IMAGING, TESTING) - I reviewed patient records, labs, notes, testing and imaging myself where available.   ASSESSMENT AND PLAN  Mallory Durham is a 79 y.o. female   Autonomic failure  With significant orthostatic blood pressure changes,  No treatable etiology found, most consistent with central nervous system degenerative disorder,  She also reported pseudobulbar phenomena  Patient is severely symptomatic from her profound autonomic failure, significant orthostatic blood pressure changes, essentially disabled from her symptoms,   Midodrine 33m tid, dose should be before 3 PM to avoid supine hypertension, continue fludrocortisone 0.1 mg 3 times a day, emphasized importance of increased water intake,  Whole body itching,  Much improved with gabapentin 100 mg 3 to 4 tablets each day  Chronic migraine headaches  Imitrex 100 mg as needed  Botox injection for chronic migraine prevention, injection was performed according to [place] protocol,  5 units of Botox was injected into each side, for 31 injection sites, total of 155 units  Bilateral frontalis 4 injection sites Bilateral corrugate 2 injection sites Procerus 1 injection sites. Bilateral temporalis 8 injection sites Bilateral occipitalis 6 injection sites Bilateral cervical paraspinals 4 injection sites Bilateral upper trapezius 6 injection sites  Extra 45 unites were injected into bilateral masseters, and the right temporoparietal region  YMarcial Pacas M.D. Ph.D.  [facility] [address] [place] [address]Ph: [phone]: [phone] CC: WFortino Sic PUtah

## 2020-04-02 NOTE — Progress Notes (Signed)
Botox- 100 units x 2 vials Lot: [id number] Expiration: 06/2022 NDC: 0023-1145-01  Bacteriostatic 0.9% Sodium Chloride- 4mL total  Lot # [id number]  NDC 63323-186-3  Exp: 06/2021   B/B   

## 2020-04-03 NOTE — Progress Notes (Signed)
Outbound call to patient with results. Advised no adjustments to dosage needed. Patient verbalized understanding.

## 2020-04-03 NOTE — Progress Notes (Signed)
Outbound call to patient explaining results. Advising no adjustments needed. Patient verbalized understanding.

## 2020-04-15 ENCOUNTER — Other Ambulatory Visit (HOSPITAL_BASED_OUTPATIENT_CLINIC_OR_DEPARTMENT_OTHER): Payer: Self-pay

## 2020-04-15 MED FILL — PENICILLIN VK 500 MG TABLET: 500 | 4 days supply | Qty: 12 | Fill #0

## 2020-04-22 ENCOUNTER — Other Ambulatory Visit: Payer: Self-pay | Admitting: Neurology

## 2020-04-22 MED FILL — ONDANSETRON ODT 8 MG TABLET: 8 | 15 days supply | Qty: 21 | Fill #1

## 2020-04-22 MED FILL — SYNTHROID 100 MCG TABLET: 100 | 30 days supply | Qty: 30 | Fill #2

## 2020-04-22 MED FILL — SUMATRIPTAN SUCC 100 MG TAB: 100 | 30 days supply | Qty: 12 | Fill #7

## 2020-04-22 MED FILL — NORTRIPTYLINE HCL 10 MG CAP: 10 | 30 days supply | Qty: 60 | Fill #6

## 2020-04-23 ENCOUNTER — Other Ambulatory Visit: Payer: Self-pay | Admitting: Neurology

## 2020-04-23 MED FILL — ZOLPIDEM TARTRATE 10 MG TAB: 10 | 30 days supply | Qty: 30 | Fill #0

## 2020-04-28 DIAGNOSIS — H52223 Regular astigmatism, bilateral: Secondary | ICD-10-CM | POA: Diagnosis not present

## 2020-04-28 DIAGNOSIS — H04123 Dry eye syndrome of bilateral lacrimal glands: Secondary | ICD-10-CM | POA: Diagnosis not present

## 2020-04-28 DIAGNOSIS — H5213 Myopia, bilateral: Secondary | ICD-10-CM | POA: Diagnosis not present

## 2020-04-28 DIAGNOSIS — H524 Presbyopia: Secondary | ICD-10-CM | POA: Diagnosis not present

## 2020-04-28 DIAGNOSIS — H5212 Myopia, left eye: Secondary | ICD-10-CM | POA: Diagnosis not present

## 2020-04-28 DIAGNOSIS — H40013 Open angle with borderline findings, low risk, bilateral: Secondary | ICD-10-CM | POA: Diagnosis not present

## 2020-04-28 DIAGNOSIS — H353132 Nonexudative age-related macular degeneration, bilateral, intermediate dry stage: Secondary | ICD-10-CM | POA: Diagnosis not present

## 2020-04-28 DIAGNOSIS — H0100A Unspecified blepharitis right eye, upper and lower eyelids: Secondary | ICD-10-CM | POA: Diagnosis not present

## 2020-05-20 ENCOUNTER — Other Ambulatory Visit (HOSPITAL_BASED_OUTPATIENT_CLINIC_OR_DEPARTMENT_OTHER): Payer: Self-pay | Admitting: Family Medicine

## 2020-05-20 DIAGNOSIS — F4323 Adjustment disorder with mixed anxiety and depressed mood: Secondary | ICD-10-CM | POA: Diagnosis not present

## 2020-05-20 DIAGNOSIS — J4520 Mild intermittent asthma, uncomplicated: Secondary | ICD-10-CM | POA: Diagnosis not present

## 2020-05-20 DIAGNOSIS — E876 Hypokalemia: Secondary | ICD-10-CM | POA: Diagnosis not present

## 2020-05-20 DIAGNOSIS — Z23 Encounter for immunization: Secondary | ICD-10-CM | POA: Diagnosis not present

## 2020-05-20 DIAGNOSIS — Z7185 Encounter for immunization safety counseling: Secondary | ICD-10-CM | POA: Diagnosis not present

## 2020-05-20 DIAGNOSIS — E559 Vitamin D deficiency, unspecified: Secondary | ICD-10-CM | POA: Diagnosis not present

## 2020-05-20 DIAGNOSIS — E785 Hyperlipidemia, unspecified: Secondary | ICD-10-CM | POA: Diagnosis not present

## 2020-05-20 DIAGNOSIS — J42 Unspecified chronic bronchitis: Secondary | ICD-10-CM | POA: Diagnosis not present

## 2020-05-20 MED FILL — SERTRALINE HCL 100 MG TABS: 100 | 30 days supply | Qty: 30 | Fill #0

## 2020-05-20 MED FILL — PRAVASTATIN NA 20 MG TAB: 20 | 30 days supply | Qty: 30 | Fill #0

## 2020-05-20 MED FILL — POTASSIUM CHL ER M10 TABLET: 10 | 30 days supply | Qty: 120 | Fill #0

## 2020-05-20 MED FILL — buPROPion HCL ER (XL) 150 M: 150 | 30 days supply | Qty: 30 | Fill #0

## 2020-05-22 MED FILL — ZOLPIDEM TARTRATE 10 MG TAB: 10 | 30 days supply | Qty: 30 | Fill #1

## 2020-05-22 MED FILL — SUMATRIPTAN SUCC 100 MG TAB: 100 | 30 days supply | Qty: 12 | Fill #8

## 2020-05-30 MED FILL — SYNTHROID 100 MCG TABLET: 100 | 30 days supply | Qty: 30 | Fill #3

## 2020-06-11 MED FILL — NORTRIPTYLINE HCL 10 MG CAP: 10 | 30 days supply | Qty: 60 | Fill #7

## 2020-06-14 IMAGING — DX DG ANKLE COMPLETE 3+V*L*
3 series · 3 of 3 positions shown · non-contrast
Comparison: None.

CLINICAL DATA: Left ankle pain after fall last night.

EXAM:
LEFT ANKLE COMPLETE - 3+ VIEW

[ankle ap]
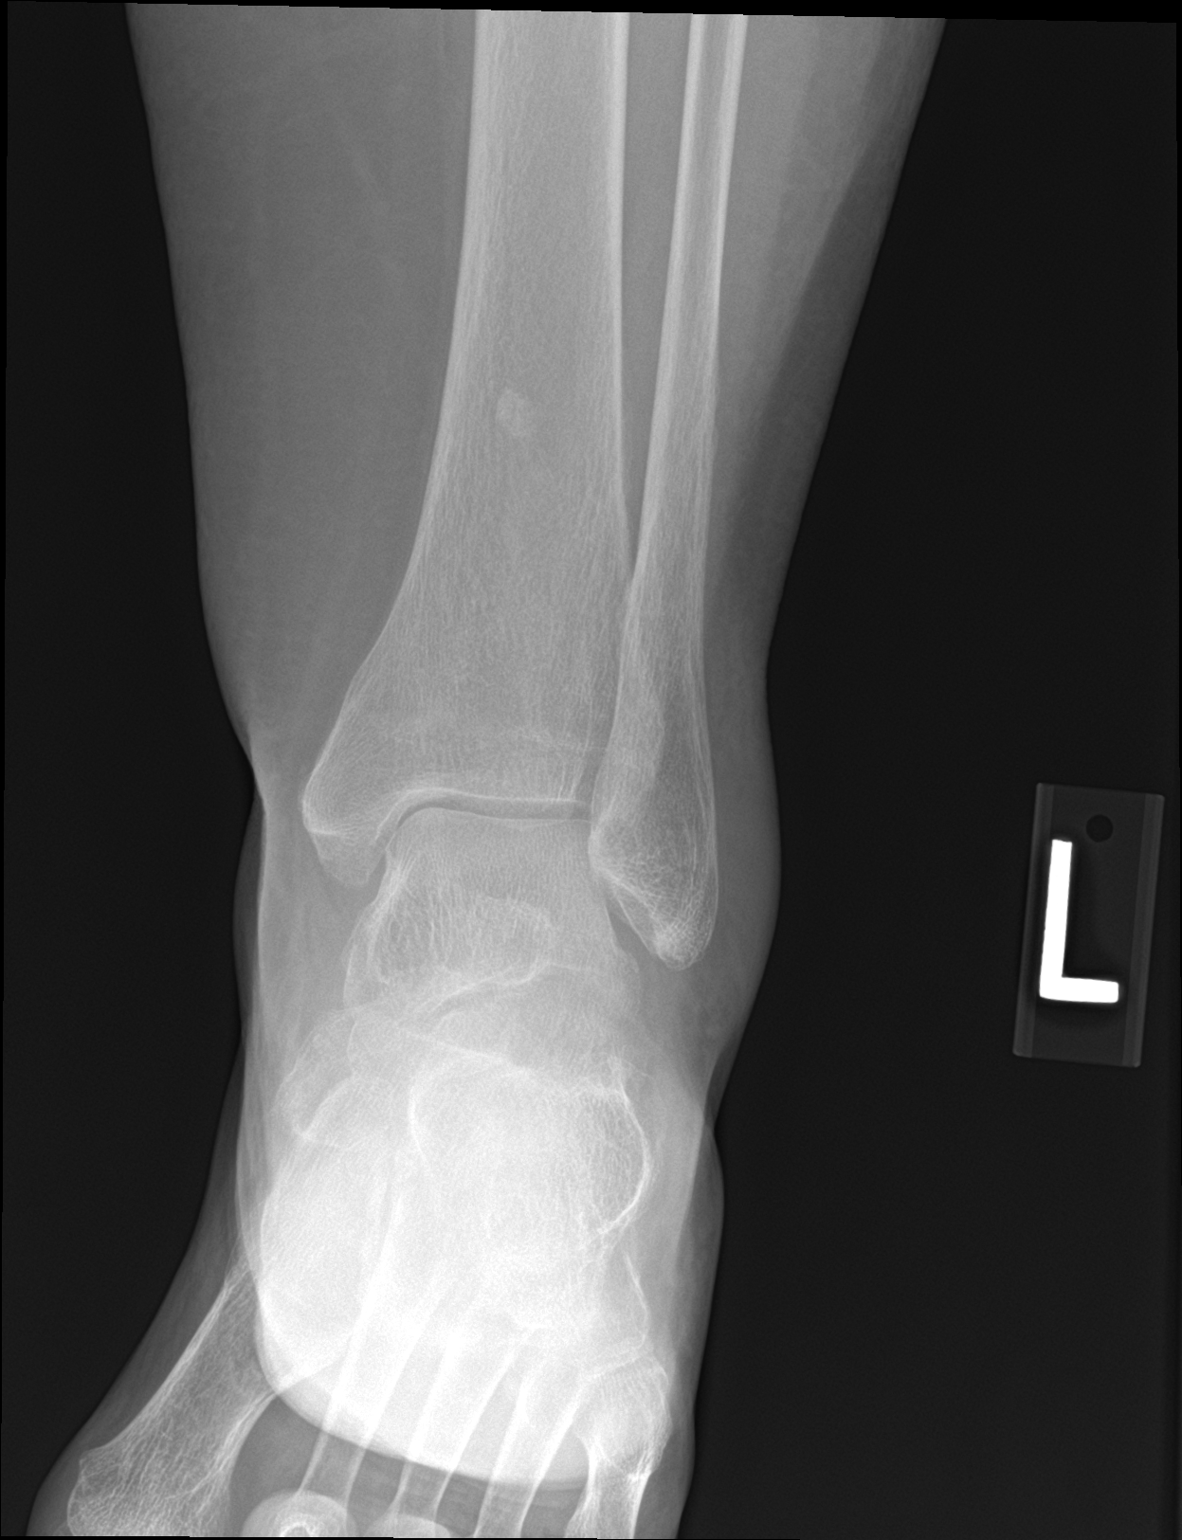

[ankle obl]
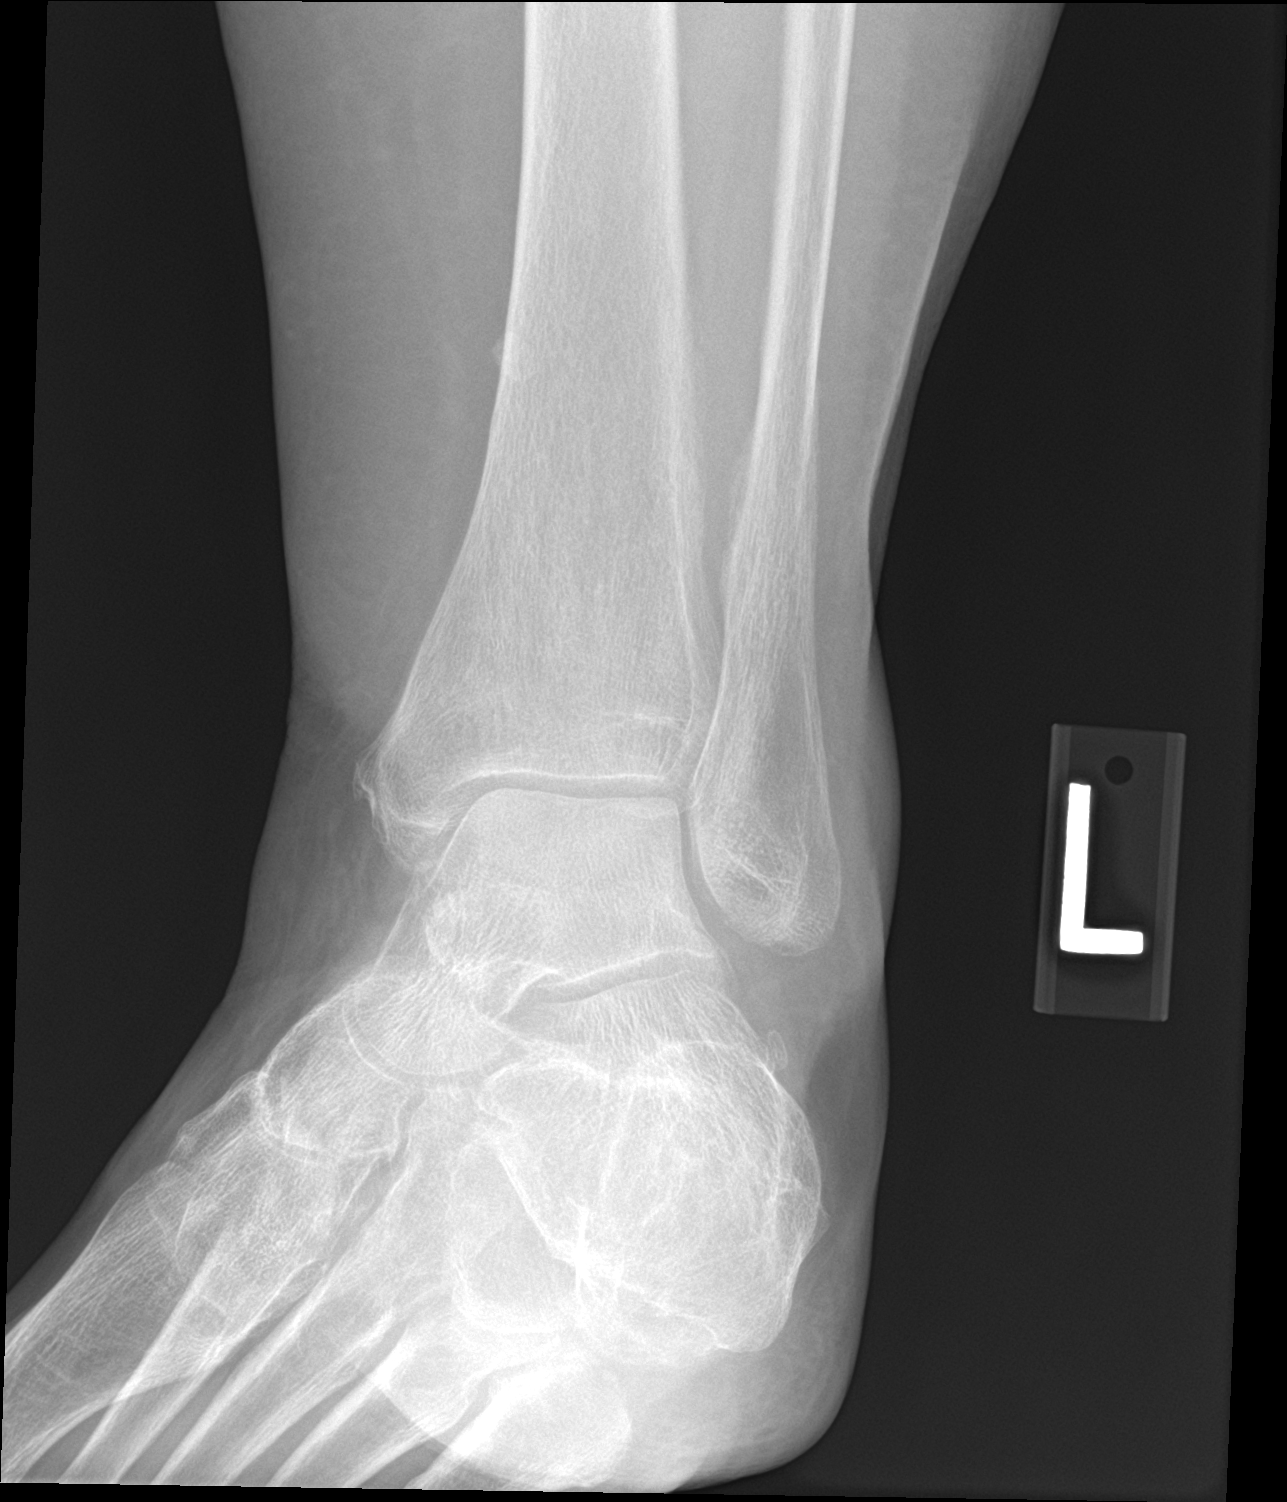

[ankle lat]
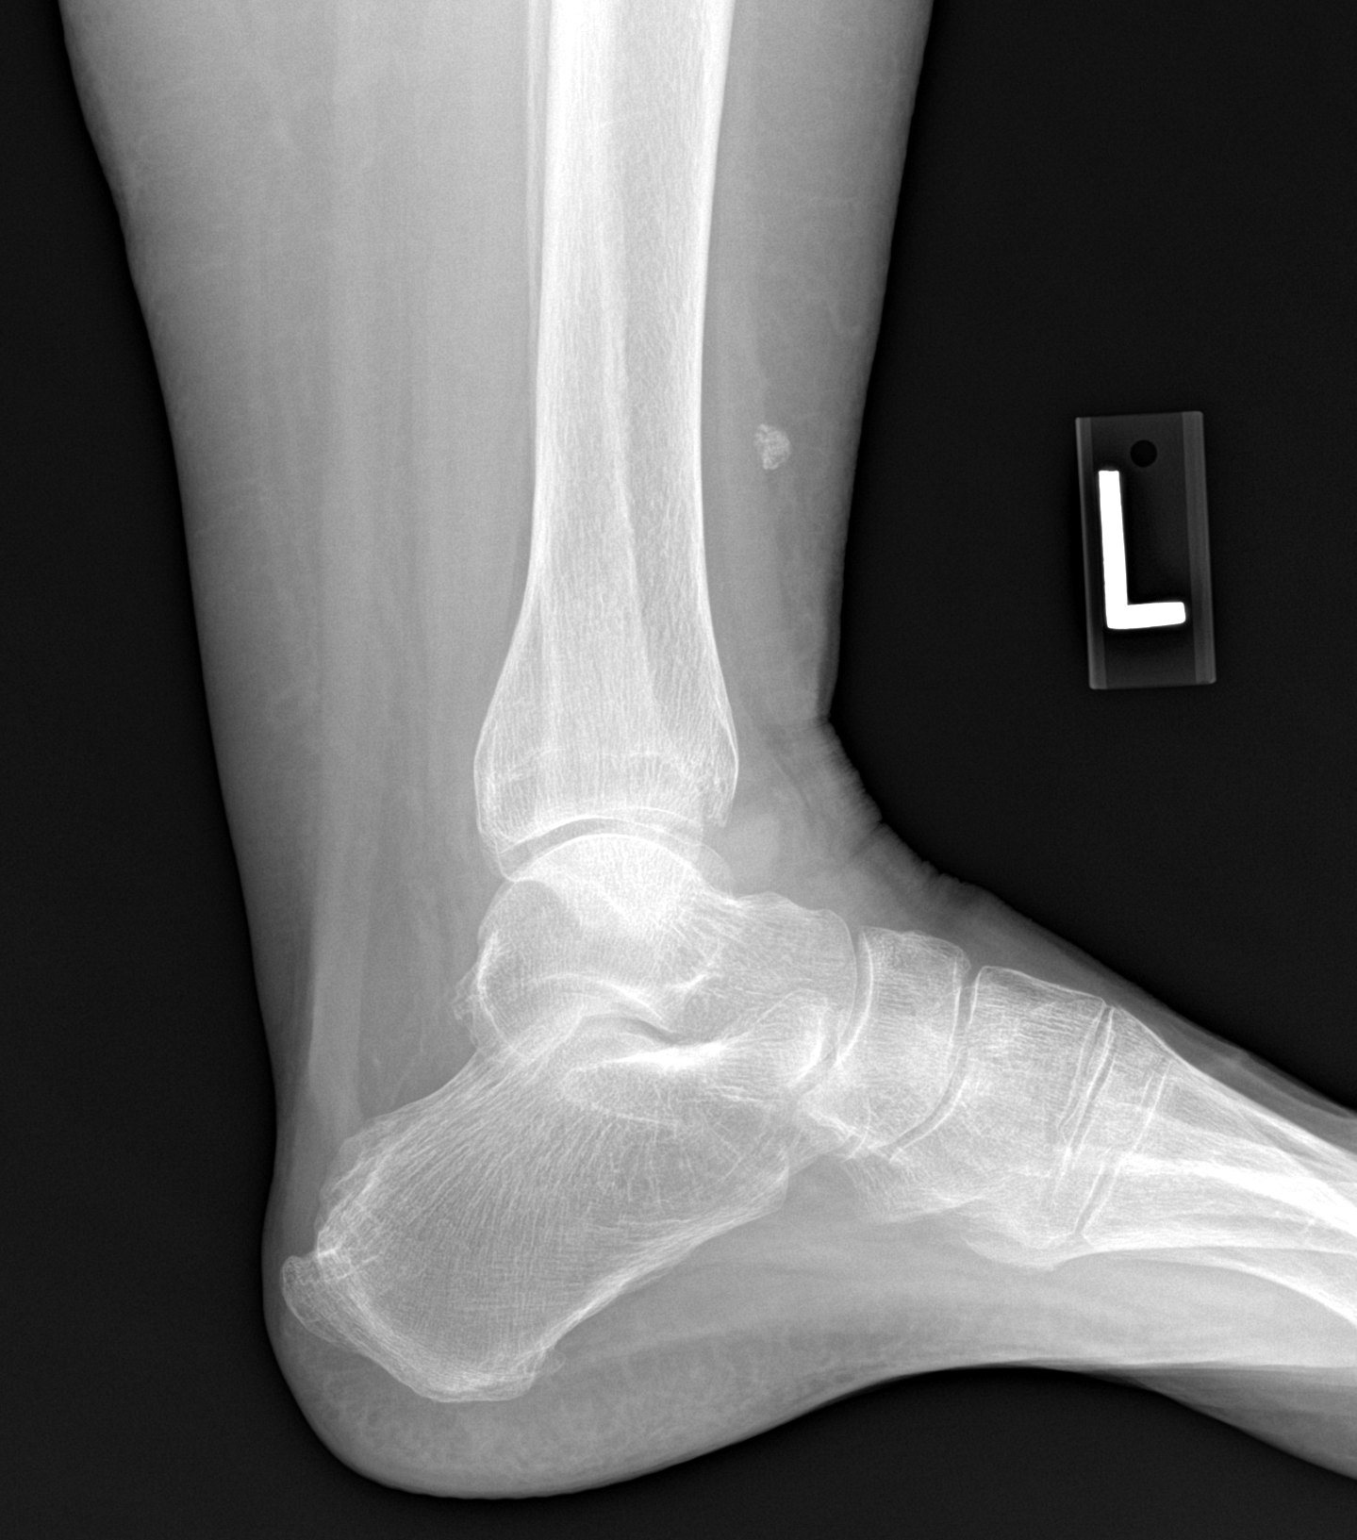

[3 of 3 positions shown; findings below may reference images not displayed]

FINDINGS: There is no evidence of fracture, dislocation, or joint effusion.
There is no evidence of arthropathy or other focal bone abnormality.
Soft tissue swelling is seen over the lateral malleolus.
IMPRESSION: No fracture or dislocation is noted. Soft tissue swelling is seen
over lateral malleolus suggesting ligamentous injury.

## 2020-06-20 MED FILL — ZOLPIDEM TARTRATE 10 MG TAB: 10 | 30 days supply | Qty: 30 | Fill #2

## 2020-06-20 MED FILL — SUMATRIPTAN SUCC 100 MG TAB: 100 | 30 days supply | Qty: 12 | Fill #9

## 2020-06-20 MED FILL — tiZANidine HCL 2 MG TABS: 2 | 10 days supply | Qty: 30 | Fill #1

## 2020-06-20 MED FILL — PRAVASTATIN NA 20 MG TAB: 20 | 30 days supply | Qty: 30 | Fill #1

## 2020-06-27 MED FILL — SYNTHROID 100 MCG TABLET: 100 | 30 days supply | Qty: 30 | Fill #4

## 2020-06-30 ENCOUNTER — Telehealth: Payer: Self-pay | Admitting: Emergency Medicine

## 2020-06-30 ENCOUNTER — Encounter: Payer: Self-pay | Admitting: Emergency Medicine

## 2020-06-30 NOTE — Telephone Encounter (Signed)
Patient's daughter called regarding about her moms appointment being moved up.  She verbalized she is to be here at 1015 for her 1045 appointment.  Patient denied further questions, verbalized understanding and expressed appreciation for the phone call.

## 2020-07-02 ENCOUNTER — Other Ambulatory Visit: Payer: Self-pay

## 2020-07-02 ENCOUNTER — Telehealth: Payer: Self-pay | Admitting: Neurology

## 2020-07-02 ENCOUNTER — Encounter: Payer: Self-pay | Admitting: Neurology

## 2020-07-02 ENCOUNTER — Ambulatory Visit: Payer: PPO | Admitting: Neurology

## 2020-07-02 ENCOUNTER — Ambulatory Visit: Payer: HMO | Admitting: Neurology

## 2020-07-02 VITALS — BP 128/80 | HR 68

## 2020-07-02 DIAGNOSIS — G43709 Chronic migraine without aura, not intractable, without status migrainosus: Secondary | ICD-10-CM | POA: Diagnosis not present

## 2020-07-02 DIAGNOSIS — I951 Orthostatic hypotension: Secondary | ICD-10-CM

## 2020-07-02 DIAGNOSIS — H81399 Other peripheral vertigo, unspecified ear: Secondary | ICD-10-CM | POA: Diagnosis not present

## 2020-07-02 DIAGNOSIS — R269 Unspecified abnormalities of gait and mobility: Secondary | ICD-10-CM | POA: Diagnosis not present

## 2020-07-02 DIAGNOSIS — R42 Dizziness and giddiness: Secondary | ICD-10-CM

## 2020-07-02 DIAGNOSIS — G901 Familial dysautonomia [Riley-Day]: Secondary | ICD-10-CM | POA: Diagnosis not present

## 2020-07-02 MED FILL — PROPRANOLOL HCL ER 60 MG CP: 60 | 90 days supply | Qty: 90 | Fill #2

## 2020-07-02 NOTE — Progress Notes (Signed)
PATIENT: Mallory Durham DOB: Jun 10, 1940  Chief Complaint  Patient presents with  . Botox for migraine    Rm 16 BOTOX dgtr with patient     HISTORICAL  Mallory Durham is a 80 year old female, seen in refer by primary care PA Fortino Sic, for evaluation of migraine headaches, chronic neck pain, initial evaluation was on June 01, 2017.  Reviewed and summarized the referring note, she has past medical history of hypothyroidism, on supplement, hyperlipidemia, chronic neck pain, chronic renal insufficiency,  Since 2016, she noticed progressive worsening dizziness, to the point now, she sits down most of the time, is not able to exercise, in addition, she is under a lot of stress, tried to see her husband at the nursing home on a daily basis.  She drinks 2 of 12 ounce of water bottle every day, but she complains lightheadedness shortly after standing up, mild improvement walking short distance, but with prolonged walking and standing, she would develop this washout sensation, lightheadedness, as if she is going to faint, she has to sit down to improve her symptoms.  During today's examination, she was found to have significant orthostatic blood pressure change in the setting of already low blood pressure, lying down blood pressure 97/70, heart rate of 83, standing up systolic blood pressure was 50 or less, required multiple measurement to register, she does become symptomatic, especially after standing up for more than 3 minutes.  Her symptoms gradually improved after lying flat.  Previously she was given prescription of midodrine 2.5 mg to get in the early morning, and before bed, there was no significant improvement noted.  She also noted gradual onset memory loss, difficulty focusing, she has history of migraine headache, increased migraine over the past few months, she could not tolerate Imitrex 100 mg as needed due to heart palpitation, she usually take half of 100 mg tablets, if  needed, she may repeat in couple hours, which take away the headache in few hours,  She is quite disabled by her dizziness, has to stay seated most of the time, she denies bilateral lower extremity swelling, no significant sensory loss.  UPDATE July 18 2017: MRI of the brain in February 2019, generalized atrophy, especially at the right perisylvian fissure area, mild supratentorium small vessel disease, there is no acute abnormality.  Laboratory evaluation showed A1c of 5.2, normal negative ANA, copper, C-reactive protein, ESR, RPR, B12, CMP, lipid profile, BNP was elevated 246   Significant low vitamin D 5.2,  Echocardiogram showed ejection fraction of 55-60%, wall motion was normal, aortic valve showed no stenosis, ventricular size was normal, systolic function was normal  She is taking gabapentin $RemoveBeforeDEI'600mg'[id number]$  bid, midodrine 10 mg 3 times a day, last dose was before she goes to bed, she also complains of chronic insomnia, taking Ambien 10 mg at nighttime  She still has depression, anxiety, she has frequent headaches, once or twice each day, she has been 50 mg as needed, 50 mg or Maxalt as needed for headaches, which helps her headache some  UPDATE November 01 2017: She is accompanied by her daughter Mallory Durham at today's clinical visit, functional status has much declined, despite midodrine 10 mg 3 times a day, and the Mestinon 60 mg 3 times a day, she complains of lightheadedness, dizziness when getting up just a few minutes, today she had significant orthostatic pressure dropped JOIT/254/98 to systolic 60 after standing up, difficult to register diastolic pressure.   She complains lightheadedness fainting sensation after lying down for  a few minutes,blood pressure points back to 120/80, her symptoms has much improved  She also complains of frequent migraine headaches, sometimes woke up with migraine headaches, taking Imitrex as needed  Chronic insomnia, Ambien 5 to 10 mg as needed  UPDATE Sept 9  2019: She is now taking fludrocortisone 0.1 mg 3 times a day, which has really helped her symptoms, along with Mestinon 60 mg daily, she is scheduled to see [facility] on February 07, 2018,  She complains of chronic migraine headaches for many years,3-4 times each week, right lateralized severe pounding headaches, with associated light noise sensitivity, Imitrex 100 mg as needed was helpful, still her headache last about 6 hours,  UPDATE May 02 2018: She is now taking fludrocortisone 0.3 mg 3 times a day, Mestinon 60 mg 3 times a day, midodrine was increased from 2.5 to 5 mg 3 times a day, orthostatic symptoms has much improved,  She has almost daily headache, more on right temporal region, imitrex 174m 1/2 tab daily,   Reported 40% improvement with Botox injection, she has less headache, less severe, but benefit short lasting,  UPDATE Apr 02 2020: She suffered COVID-19 in early October 2021, recovering well, still complains of significant fatigue, orthostatic dizziness, fainting spells,  Reviewed evaluation by cardiologist Dr. SVirl Axeon December 25, 2019, agree with current medication management, no longer take Mestinon, f still on fludrocortisone 0.1 mg 3 times a day, midodrine 10 mg 2-3 times a day,  Skin itching is well managed by gabapentin 300 mg 3 times a day, only occasionally Benadryl,  Migraine headache is overall under good control, Botox has really helped,  UPDATE Jul 02 2020: She complains of worsening orthostatic hypotension, dizziness, taking midodrine 10 mg twice a day, today sitting blood pressure 130/80, standing up 90/60  She continue have frequent headaches but improved with Botox injection, it is less frequent, easier to treat,  In addition, she reported 1 episode of sudden onset of dizziness, vertigo, nausea, double vision, followed by headache, lasting for 5 hours, this happened 2 to 3 weeks ago, now she is back to her baseline, had history of  similar occurrence in the past,    REVIEW OF SYSTEMS: Full 14 system review of systems performed and notable only for above  All rest review of system were negative  ALLERGIES: No Known Allergies   HOME MEDICATIONS: Current Outpatient Medications  Medication Sig Dispense Refill  . aspirin-acetaminophen-caffeine (EXCEDRIN MIGRAINE) 250-250-65 MG tablet Take by mouth every 6 (six) hours as needed for headache.    . botulinum toxin Type A (BOTOX) 100 units SOLR injection Inject IM every 3 months by MD in the office 2 vial 3  . buPROPion (WELLBUTRIN XL) 150 MG 24 hr tablet Take 1 tablet (150 mg total) by mouth daily. 30 tablet 0  . Cholecalciferol (D3-1000 PO) Take 1 tablet by mouth daily.    . diphenhydrAMINE (BENADRYL) 25 MG tablet Take 25 mg by mouth every 6 (six) hours as needed. Unsure of dose    . fludrocortisone (FLORINEF) 0.1 MG tablet Take 1 tablet (0.1 mg total) by mouth 3 (three) times daily. 270 tablet 4  . gabapentin (NEURONTIN) 100 MG capsule Take 3 capsules (300 mg total) by mouth 3 (three) times daily. Take one cap in am, one cap midday, two cap in pm. 810 capsule 4  . midodrine (PROAMATINE) 10 MG tablet Take 1 tablet (10 mg total) by mouth 3 (three) times daily. Pt needs to keep  appt with provider in June for further refills 270 tablet 4  . Multiple Vitamins-Minerals (PRESERVISION AREDS 2) CAPS Take 2 capsules by mouth.    . nortriptyline (PAMELOR) 10 MG capsule TAKE 2 CAPSULES (20 MG TOTAL) BY MOUTH AT BEDTIME. 60 capsule 11  . potassium chloride (KLOR-CON) 10 MEQ tablet Take 3 tablets (30 mEq total) by mouth 2 (two) times daily. 540 tablet 3  . Probiotic Product (PROBIOTIC PO) Take 1 capsule by mouth as directed.    . propranolol ER (INDERAL LA) 60 MG 24 hr capsule TAKE 1 CAPSULE BY MOUTH AT BEDTIME 90 capsule 3  . sertraline (ZOLOFT) 100 MG tablet Take 100 mg by mouth daily.    . simvastatin (ZOCOR) 20 MG tablet Take 1 tablet (20 mg total) by mouth every evening. 90 tablet  1  . SUMAtriptan (IMITREX) 100 MG tablet TAKE 1 TAB BY MOUTH AT ONSET OF MIGRAINE. MAY REPEAT IN 2 HRS, IF NEEDED. MAX DOSE: 2 TABS/DAY. THIS IS A 30 DAY PRESCRIPTION. 12 tablet 11  . SYNTHROID 100 MCG tablet Take 1 tablet (100 mcg total) by mouth daily before breakfast. 30 tablet 11  . tiZANidine (ZANAFLEX) 2 MG tablet TAKE 1 TABLET (2 MG TOTAL) BY MOUTH EVERY 8 (EIGHT) HOURS AS NEEDED FOR MUSCLE SPASMS. 30 tablet 1  . vitamin B-12 (CYANOCOBALAMIN) 1000 MCG tablet Take 1,000 mcg by mouth daily.    Marland Kitchen zolpidem (AMBIEN) 10 MG tablet TAKE 1/2 - 1 TABLET BY MOUTH EVERY NIGHT AT BEDTIME AS NEEDED 30 tablet 5  . ondansetron (ZOFRAN-ODT) 8 MG disintegrating tablet Take 8 mg by mouth every 8 (eight) hours as needed.     No current facility-administered medications for this visit.    PAST MEDICAL HISTORY: Past Medical History:  Diagnosis Date  . 2019 novel coronavirus disease (COVID-19)   . Anxiety   . Confusion, hx of, without neuro findings   . Depression   . Hx of vertigo   . Hyperlipidemia   . Hypothyroid   . Left shoulder pain   . Migraines   . Neck pain   . Orthostatic hypotension   . Renal insufficiency     PAST SURGICAL HISTORY: Past Surgical History:  Procedure Laterality Date  . ABDOMINAL HYSTERECTOMY    . ABDOMINAL HYSTERECTOMY    . BREAST LUMPECTOMY    . CHOLECYSTECTOMY    . JOINT REPLACEMENT    . THYROID SURGERY    . TONSILLECTOMY      FAMILY HISTORY: Family History  Problem Relation Age of Onset  . Heart disease Mother        Angina  . Cancer Mother   . Cancer Father   . Heart disease Father   . Cancer Maternal Aunt     SOCIAL HISTORY:  Social History   Socioeconomic History  . Marital status: Married    Spouse name: Not on file  . Number of children: 2  . Years of education: 16  . Highest education level: Bachelor's degree (e.g., BA, AB, BS)  Occupational History    Employer: RETIRED  Tobacco Use  . Smoking status: Never Smoker  . Smokeless  tobacco: Never Used  Vaping Use  . Vaping Use: Never used  Substance and Sexual Activity  . Alcohol use: No  . Drug use: No  . Sexual activity: Not on file  Other Topics Concern  . Not on file  Social History Narrative   Lives at home alone.   Right-handed.   1-2 cups caffeine  per day.   Social Determinants of Health   Financial Resource Strain: Not on file  Food Insecurity: Not on file  Transportation Needs: Not on file  Physical Activity: Not on file  Stress: Not on file  Social Connections: Not on file  Intimate Partner Violence: Not on file     PHYSICAL EXAM Blood pressure sitting down 130/80,, standing up 90/60, There is no height or weight on file to calculate BMI.  PHYSICAL EXAMNIATION:  Gen: NAD, conversant, well nourised, obese, well groomed                       NEUROLOGICAL EXAM:  MENTAL STATUS: Speech/cognition: Awake alert oriented to history taking and casual conversation   CRANIAL NERVES: CN II: Visual fields are full to confrontation. Pupils are round equal and briskly reactive to light. CN III, IV, VI: extraocular movement are normal. No ptosis. CN V: Facial sensation is intact to pinprick in all 3 divisions bilaterally. Corneal responses are intact.  CN VII: Face is symmetric with normal eye closure and smile. CN VIII: Hearing is normal to rubbing fingers CN IX, X: Palate elevates symmetrically. Phonation is normal. CN XI: Head turning and shoulder shrug are intact  MOTOR: There is no pronator drift of out-stretched arms. Muscle bulk and tone are normal. Muscle strength is normal.  REFLEXES: Reflexes are hypoactive and symmetric at the biceps, triceps, knees, and ankles. Plantar responses are flexor.  SENSORY: Mildly less dependent sensory changes to pinprick.  COORDINATION: No limb or truncal ataxia noted  GAIT/STANCE: She can get up without pushing on chair arm, steady, Romberg is absent.   DIAGNOSTIC DATA (LABS, IMAGING,  TESTING) - I reviewed patient records, labs, notes, testing and imaging myself where available.   ASSESSMENT AND PLAN  Sharona Rovner is a 80 y.o. female   Autonomic failure  With significant orthostatic blood pressure changes,  No treatable etiology found, most consistent with central nervous system degenerative disorder,  She also reported pseudobulbar phenomena  Patient is severely symptomatic from her profound autonomic failure, significant orthostatic blood pressure changes, essentially disabled from her symptoms,   Midodrine 31m tid, dose should be before 3 PM to avoid supine hypertension, continue fludrocortisone 0.1 mg 3 times a day, emphasized importance of increased water intake,  New onset vertigo, double vision  MRI of the brain to rule out brainstem/cerebellar stroke  Chronic migraine headaches  Imitrex 100 mg as needed  Botox injection for chronic migraine prevention, injection was performed according to [place] protocol,  5 units of Botox was injected into each side, for 31 injection sites, total of 155 units  Bilateral frontalis 4 injection sites Bilateral corrugate 2 injection sites Procerus 1 injection sites. Bilateral temporalis 8 injection sites Bilateral occipitalis 6 injection sites Bilateral cervical paraspinals 4 injection sites Bilateral upper trapezius 6 injection sites  Extra 45 unites were injected into bilateral masseters, and the right temporoparietal region  YMarcial Pacas M.D. Ph.D.  [facility] [address] [place] [id number]: [phone]: [phone] CC: WFortino Sic PUtah

## 2020-07-02 NOTE — Progress Notes (Signed)
Botox- 200 Units x 1 vial Lot:[id number] Expiration:02/2023 NDC: 3976-7341-93

## 2020-07-02 NOTE — Telephone Encounter (Signed)
Pt would like to change Botox appointment to the next day, 05/26, if possible so that she can come the same day as another appointment.

## 2020-07-02 NOTE — Telephone Encounter (Signed)
health team order sent to GI. No auth they will reach out to the patient to schedule.  

## 2020-07-03 NOTE — Telephone Encounter (Signed)
Called patient's daughter and LVM regarding new appt date. Appt rescheduled for 5/26 at 11:30.

## 2020-07-04 MED ORDER — ONABOTULINUMTOXINA 100 UNITS IJ SOLR
200.0000 [IU] | Freq: Once | INTRAMUSCULAR | Status: AC
  Administered 2020-07-04: 200 [IU] via INTRAMUSCULAR

## 2020-07-14 NOTE — Telephone Encounter (Signed)
Closed Noted

## 2020-07-15 MED FILL — SUMATRIPTAN SUCCINATE 100 M: 100 | 30 days supply | Qty: 12 | Fill #10

## 2020-07-15 MED FILL — NORTRIPTYLINE HCL 10 MG CAP: 10 | 30 days supply | Qty: 60 | Fill #8

## 2020-07-15 MED FILL — GABAPENTIN 100 MG CAPSULE: 100 | 90 days supply | Qty: 810 | Fill #2

## 2020-07-17 ENCOUNTER — Ambulatory Visit
Admission: RE | Admit: 2020-07-17 | Discharge: 2020-07-17 | Disposition: A | Discharge status: Home / Self Care | Payer: PPO | Source: Ambulatory Visit | Attending: Neurology | Admitting: Neurology

## 2020-07-17 DIAGNOSIS — H81399 Other peripheral vertigo, unspecified ear: Secondary | ICD-10-CM

## 2020-07-17 DIAGNOSIS — R42 Dizziness and giddiness: Secondary | ICD-10-CM | POA: Diagnosis not present

## 2020-07-18 MED FILL — ZOLPIDEM TARTRATE 10 MG TAB: 10 | 30 days supply | Qty: 30 | Fill #3

## 2020-07-21 ENCOUNTER — Telehealth: Payer: Self-pay | Admitting: Neurology

## 2020-07-21 NOTE — Telephone Encounter (Signed)
  IMPRESSION:   MRI brain without contrast demonstrating: -Mild chronic small vessel ischemic disease. -No acute findings.  Please call patient, MRI of the brain showed no acute abnormality, mild age-related changes

## 2020-07-21 NOTE — Telephone Encounter (Signed)
Called and spoke with pt about MRI results per Dr. Terrace Arabia note. Pt verbalized understanding.

## 2020-08-13 MED FILL — SERTRALINE HCL 100 MG TABS: 100 | 30 days supply | Qty: 30 | Fill #1

## 2020-08-13 MED FILL — ZOLPIDEM TARTRATE 10 MG TAB: 10 | 30 days supply | Qty: 30 | Fill #4

## 2020-08-13 MED FILL — NORTRIPTYLINE HCL 10 MG CAP: 10 | 30 days supply | Qty: 60 | Fill #9

## 2020-08-19 ENCOUNTER — Other Ambulatory Visit: Payer: Self-pay | Admitting: Neurology

## 2020-08-19 ENCOUNTER — Other Ambulatory Visit (HOSPITAL_BASED_OUTPATIENT_CLINIC_OR_DEPARTMENT_OTHER): Payer: Self-pay

## 2020-08-19 MED ORDER — SUMATRIPTAN SUCCINATE 100 MG PO TABS
ORAL_TABLET | ORAL | 11 refills | Status: DC
  Filled 2020-08-19: qty 12, 30d supply, fill #0
  Filled 2020-10-13: qty 12, 30d supply, fill #1
  Filled 2020-11-26: qty 12, 30d supply, fill #2
  Filled 2020-12-29: qty 12, 30d supply, fill #3
  Filled 2021-02-12: qty 12, 30d supply, fill #4
  Filled 2021-03-17: qty 12, 30d supply, fill #5
  Filled 2021-04-30: qty 12, 30d supply, fill #6
  Filled 2021-06-03: qty 12, 30d supply, fill #7
  Filled 2021-07-22: qty 12, 30d supply, fill #8

## 2020-08-20 ENCOUNTER — Other Ambulatory Visit (HOSPITAL_BASED_OUTPATIENT_CLINIC_OR_DEPARTMENT_OTHER): Payer: Self-pay

## 2020-08-20 MED ORDER — ONDANSETRON 8 MG PO TBDP
ORAL_TABLET | ORAL | 1 refills | Status: DC
  Filled 2020-08-20: qty 21, 7d supply, fill #0

## 2020-08-26 ENCOUNTER — Telehealth: Payer: Self-pay | Admitting: Neurology

## 2020-08-26 DIAGNOSIS — G629 Polyneuropathy, unspecified: Secondary | ICD-10-CM

## 2020-08-26 DIAGNOSIS — R269 Unspecified abnormalities of gait and mobility: Secondary | ICD-10-CM

## 2020-08-26 DIAGNOSIS — G909 Disorder of the autonomic nervous system, unspecified: Secondary | ICD-10-CM

## 2020-08-26 DIAGNOSIS — R413 Other amnesia: Secondary | ICD-10-CM

## 2020-08-26 DIAGNOSIS — I951 Orthostatic hypotension: Secondary | ICD-10-CM

## 2020-08-26 NOTE — Telephone Encounter (Signed)
[employer] Cala Bradford) called, Pt is requesting home health. Need home health order with bathing.  Contact info: [phone]

## 2020-08-27 ENCOUNTER — Other Ambulatory Visit (HOSPITAL_BASED_OUTPATIENT_CLINIC_OR_DEPARTMENT_OTHER): Payer: Self-pay

## 2020-08-27 MED FILL — Levothyroxine Sodium Tab 100 MCG: ORAL | 30 days supply | Qty: 30 | Fill #0 | Status: AC

## 2020-08-27 MED FILL — Pravastatin Sodium Tab 20 MG: ORAL | 30 days supply | Qty: 30 | Fill #0 | Status: AC

## 2020-08-27 NOTE — Telephone Encounter (Signed)
I returned the call to Cala Bradford (nurse case manager) who is aware the orders have been placed. She is going to follow up with the patient and her family.

## 2020-08-27 NOTE — Telephone Encounter (Signed)
Per VO by Dr. Terrace Arabia, okay to place home health order for this patient.

## 2020-09-10 ENCOUNTER — Other Ambulatory Visit (HOSPITAL_BASED_OUTPATIENT_CLINIC_OR_DEPARTMENT_OTHER): Payer: Self-pay

## 2020-09-10 MED FILL — Fludrocortisone Acetate Tab 0.1 MG: ORAL | 90 days supply | Qty: 270 | Fill #0 | Status: AC

## 2020-09-11 ENCOUNTER — Other Ambulatory Visit (HOSPITAL_BASED_OUTPATIENT_CLINIC_OR_DEPARTMENT_OTHER): Payer: Self-pay

## 2020-09-11 DIAGNOSIS — Z96652 Presence of left artificial knee joint: Secondary | ICD-10-CM | POA: Diagnosis not present

## 2020-09-11 DIAGNOSIS — M1711 Unilateral primary osteoarthritis, right knee: Secondary | ICD-10-CM | POA: Diagnosis not present

## 2020-09-11 DIAGNOSIS — M25561 Pain in right knee: Secondary | ICD-10-CM | POA: Diagnosis not present

## 2020-09-11 MED FILL — Zolpidem Tartrate Tab 10 MG: ORAL | 60 days supply | Qty: 30 | Fill #0 | Status: AC

## 2020-09-29 ENCOUNTER — Other Ambulatory Visit: Payer: Self-pay | Admitting: Neurology

## 2020-09-29 ENCOUNTER — Other Ambulatory Visit (HOSPITAL_BASED_OUTPATIENT_CLINIC_OR_DEPARTMENT_OTHER): Payer: Self-pay

## 2020-09-29 MED ORDER — NORTRIPTYLINE HCL 10 MG PO CAPS
ORAL_CAPSULE | ORAL | 11 refills | Status: DC
  Filled 2020-09-29: qty 60, 30d supply, fill #0
  Filled 2020-10-29: qty 60, 30d supply, fill #1
  Filled 2020-12-03: qty 60, 30d supply, fill #2
  Filled 2021-01-07: qty 60, 30d supply, fill #3
  Filled 2021-02-06: qty 60, 30d supply, fill #4
  Filled 2021-03-11: qty 60, 30d supply, fill #5
  Filled 2021-04-23: qty 60, 30d supply, fill #6
  Filled 2021-06-09: qty 60, 30d supply, fill #7
  Filled 2021-07-07: qty 60, 30d supply, fill #8

## 2020-09-29 MED FILL — Levothyroxine Sodium Tab 100 MCG: ORAL | 30 days supply | Qty: 30 | Fill #1 | Status: AC

## 2020-09-30 ENCOUNTER — Other Ambulatory Visit (HOSPITAL_BASED_OUTPATIENT_CLINIC_OR_DEPARTMENT_OTHER): Payer: Self-pay

## 2020-09-30 DIAGNOSIS — R399 Unspecified symptoms and signs involving the genitourinary system: Secondary | ICD-10-CM | POA: Diagnosis not present

## 2020-09-30 DIAGNOSIS — N3000 Acute cystitis without hematuria: Secondary | ICD-10-CM | POA: Diagnosis not present

## 2020-09-30 MED ORDER — AMOXICILLIN-POT CLAVULANATE 875-125 MG PO TABS
ORAL_TABLET | ORAL | 1 refills | Status: DC
  Filled 2020-09-30: qty 14, 7d supply, fill #0

## 2020-09-30 MED ORDER — URIBEL 118 MG PO CAPS
ORAL_CAPSULE | ORAL | 1 refills | Status: DC
  Filled 2020-09-30: qty 28, 7d supply, fill #0

## 2020-10-01 ENCOUNTER — Other Ambulatory Visit (HOSPITAL_BASED_OUTPATIENT_CLINIC_OR_DEPARTMENT_OTHER): Payer: Self-pay

## 2020-10-03 ENCOUNTER — Other Ambulatory Visit (HOSPITAL_BASED_OUTPATIENT_CLINIC_OR_DEPARTMENT_OTHER): Payer: Self-pay

## 2020-10-03 MED FILL — Bupropion HCl Tab ER 24HR 150 MG: ORAL | 30 days supply | Qty: 30 | Fill #0 | Status: AC

## 2020-10-03 MED FILL — Pravastatin Sodium Tab 20 MG: ORAL | 30 days supply | Qty: 30 | Fill #1 | Status: AC

## 2020-10-03 MED FILL — Potassium Chloride Microencapsulated Crys ER Tab 10 mEq: ORAL | 30 days supply | Qty: 120 | Fill #0 | Status: AC

## 2020-10-06 ENCOUNTER — Other Ambulatory Visit (HOSPITAL_BASED_OUTPATIENT_CLINIC_OR_DEPARTMENT_OTHER): Payer: Self-pay

## 2020-10-08 ENCOUNTER — Ambulatory Visit: Payer: PPO | Admitting: Neurology

## 2020-10-09 ENCOUNTER — Ambulatory Visit: Payer: PPO | Admitting: Neurology

## 2020-10-09 ENCOUNTER — Other Ambulatory Visit: Payer: Self-pay

## 2020-10-09 ENCOUNTER — Telehealth: Payer: Self-pay | Admitting: *Deleted

## 2020-10-09 ENCOUNTER — Ambulatory Visit: Payer: PPO | Admitting: Internal Medicine

## 2020-10-09 ENCOUNTER — Encounter: Payer: Self-pay | Admitting: Internal Medicine

## 2020-10-09 VITALS — BP 110/70 | HR 75 | Ht 67.0 in | Wt 215.2 lb

## 2020-10-09 DIAGNOSIS — E890 Postprocedural hypothyroidism: Secondary | ICD-10-CM

## 2020-10-09 LAB — TSH: TSH: 0.14 u[IU]/mL — ABNORMAL LOW (ref 0.35–4.50)

## 2020-10-09 LAB — T4, FREE: Free T4: 1.21 ng/dL (ref 0.60–1.60)

## 2020-10-09 NOTE — Patient Instructions (Signed)
Please continue Synthroid 100 mcg daily. ? ?Take the thyroid hormone every day, with water, at least 30 minutes before breakfast, separated by at least 4 hours from: ?- acid reflux medications ?- calcium ?- iron ?- multivitamins ? ?Please stop at the lab. ? ?Please come back for a follow-up appointment in 1 year. ? ? ?

## 2020-10-09 NOTE — Progress Notes (Signed)
Patient ID: Mallory Durham Durham, female   DOB: [birth year], 80 y.o.   MRN: [id number]   This visit occurred during the SARS-CoV-2 public health emergency.  Safety protocols were in place, including screening questions prior to the visit, additional usage of staff PPE, and extensive cleaning of exam room while observing appropriate contact time as indicated for disinfecting solutions.   HPI  Mallory Durham Delaguila is a 80 y.o.-year-old female, presenting for f/u for postsurgical hypothyroidism. Last visit 1 year ago.  Interim history: She had COVID-19 in 02/2020.  She got the antibody infusion. She had vertigo >> resolved. She does not have other complaints.  Reviewed history: 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  A TSH returned slightly low in 09/2016 and we decreased the dose at that time to 112 mcg daily.  In 11/2017, we had to decrease the dose even more, to 100 mcg daily.  She continues on Synthroid d.a.w. 100 mcg daily: - in am - fasting - at least 30 min from b'fast - no Ca, Fe, MVI, + PPIs at lunchtime - not on Biotin -+ AREDS 2 (no Ca or iron) 2x a day, first dose in am, with b'fast  Reviewed patient's TFTs:: Lab Results  Component Value Date   TSH 0.43 04/02/2020   TSH 4.37 10/04/2019   TSH 1.60 11/09/2018   TSH 4.700 (H) 09/27/2018   TSH 0.83 11/15/2017   TSH 0.10 (L) 09/27/2017   TSH 0.45 11/30/2016   TSH 0.26 (L) 09/27/2016   TSH 0.38 07/05/2016   TSH 0.37 03/22/2016   FREET4 1.32 04/02/2020   FREET4 1.13 10/04/2019   FREET4 1.13 11/09/2018   FREET4 0.94 11/15/2017   FREET4 1.01 09/27/2017   FREET4 0.99 11/30/2016   FREET4 1.13 09/27/2016   FREET4 0.95 07/05/2016   FREET4 1.22 03/22/2016   FREET4 0.92 11/20/2015  03/31/2015: TSH 6.82, fT4 1.24 01/22/2015: TSH  0.420 11/28/2014: TSH 5.440  TPO ABs per [facility] records - ? Why checked since pt had thyroidectomy in 2000...:  03/31/2015: TPO Abs <6 10/03/2013: TPO Abs 7 07/02/2010: TPO Abs <6  Pt denies: - feeling nodules in neck - hoarseness - dysphagia - choking - SOB with lying down  She has + FH of thyroid disorders in mother and sister: hypothyroidism. No FH of thyroid cancer. No h/o radiation tx to head or neck.  No herbal supplements. No Biotin use. No recent steroids use - injection in knee 1 mo ago.  She also has a history of hysterectomy in 1994. Also, HL GERD, depression, ruptured Achilles tendon 2015.  She was dx'ed with autonomic dysfunction (low BP episodes) >> On fludrocortisone 3x a day and midodrine. She sees cardiology.  On nortriptyline for headache prevention.  Also on gabapentin for neck muscle relaxation.  ROS: Constitutional: no weight gain/no weight loss, no fatigue, no subjective hyperthermia, no subjective hypothermia Eyes: no blurry vision, no xerophthalmia ENT: no sore throat, + see HPI Cardiovascular: no CP/no SOB/no palpitations/+ leg swelling Respiratory: no cough/no SOB/no wheezing Gastrointestinal: no N/no V/no D/no C/no acid reflux Musculoskeletal: no muscle aches/no joint aches Skin: no rashes, no hair loss Neurological: no tremors/no numbness/no tingling/+ dizziness  I reviewed pt's medications, allergies, PMH, social hx, family hx, and changes were documented in the history of present illness. Otherwise, unchanged from my initial visit note.  Past  Medical History:  Diagnosis Date  . 2019 novel coronavirus disease (COVID-19)   . Anxiety   . Confusion, hx of, without neuro findings   . Depression   . Hx of vertigo   . Hyperlipidemia   . Hypothyroid   . Left shoulder pain   . Migraines   . Neck pain   . Orthostatic hypotension   . Renal insufficiency    Past Surgical History:  Procedure Laterality Date  . ABDOMINAL HYSTERECTOMY    .  ABDOMINAL HYSTERECTOMY    . BREAST LUMPECTOMY    . CHOLECYSTECTOMY    . JOINT REPLACEMENT    . THYROID SURGERY    . TONSILLECTOMY     Social History   Social History  . Marital status: Married    Spouse name: N/A  . Number of children: 2   Occupational History  .  Retired   Social History Main Topics  . Smoking status: Never Smoker  . Smokeless tobacco: Never Used  . Alcohol use No  . Drug use: No   Current Outpatient Medications on File Prior to Visit  Medication Sig Dispense Refill  . albuterol (VENTOLIN HFA) 108 (90 Base) MCG/ACT inhaler INHALE 2 PUFFS INTO THE LUNGS EVERY 6 HOURS AS NEEDED FOR WHEEZING OR SHORTNESS OF BREATH 18 g 11  . amoxicillin-clavulanate (AUGMENTIN) 875-125 MG tablet TAKE 1 TABLET BY MOUTH 2 TIMES DAILY FOR 7 DAYS. 14 tablet 1  . amoxicillin-clavulanate (AUGMENTIN) 875-125 MG tablet Take 1 tablet by mouth 2 times daily for 7 days. 14 tablet 1  . aspirin-acetaminophen-caffeine (EXCEDRIN MIGRAINE) 250-250-65 MG tablet Take by mouth every 6 (six) hours as needed for headache.    . botulinum toxin Type A (BOTOX) 100 units SOLR injection Inject IM every 3 months by MD in the office 2 vial 3  . buPROPion (WELLBUTRIN XL) 150 MG 24 hr tablet Take 1 tablet (150 mg total) by mouth daily. 30 tablet 0  . buPROPion (WELLBUTRIN XL) 150 MG 24 hr tablet TAKE 1 TABLET BY MOUTH EVERY MORNING 30 tablet 5  . Cholecalciferol (D3-1000 PO) Take 1 tablet by mouth daily.    . diphenhydrAMINE (BENADRYL) 25 MG tablet Take 25 mg by mouth every 6 (six) hours as needed. Unsure of dose    . fludrocortisone (FLORINEF) 0.1 MG tablet TAKE 1 TABLET BY MOUTH THREE TIMES DAILY 270 tablet 4  . fludrocortisone (FLORINEF) 0.1 MG tablet TAKE 1 TABLET (0.1 MG TOTAL) BY MOUTH 3 (THREE) TIMES DAILY. 90 tablet 5  . gabapentin (NEURONTIN) 100 MG capsule TAKE 3 CAPSULES (300 MG TOTAL) BY MOUTH 3 (THREE) TIMES DAILY. 810 capsule 4  . Meth-Hyo-M Bl-Na Phos-Ph Sal (URIBEL) 118 MG CAPS Take 1 capsule by  mouth 4 times daily for 7 days. 28 capsule 1  . midodrine (PROAMATINE) 10 MG tablet TAKE 1 TABLET (10 MG TOTAL) BY MOUTH 3 (THREE) TIMES DAILY. PT NEEDS TO KEEP APPT WITH PROVIDER IN JUNE FOR FURTHER REFILLS 270 tablet 4  . Multiple Vitamins-Minerals (PRESERVISION AREDS 2) CAPS Take 2 capsules by mouth.    . nortriptyline (PAMELOR) 10 MG capsule TAKE 2 CAPSULES (20 MG TOTAL) BY MOUTH AT BEDTIME. 60 capsule 11  . ondansetron (ZOFRAN-ODT) 8 MG disintegrating tablet Take 8 mg by mouth every 8 (eight) hours as needed.    . ondansetron (ZOFRAN-ODT) 8 MG disintegrating tablet DISSOLVE 1 TABLET (8 MG TOTAL) BY MOUTH EVERY 8 (EIGHT) HOURS AS NEEDED FOR UP TO 5 DAYS FOR NAUSEA. 21 tablet 1  .  penicillin v potassium (VEETID) 500 MG tablet TAKE 1 TABLET BY MOUTH EVERY 8 HOURS UNTIL GONE 12 tablet 0  . potassium chloride (KLOR-CON) 10 MEQ tablet Take 3 tablets (30 mEq total) by mouth 2 (two) times daily. 540 tablet 3  . potassium chloride (KLOR-CON) 10 MEQ tablet TAKE 2 TABLETS BY MOUTH 2 TIMES DAILY 120 tablet 11  . pravastatin (PRAVACHOL) 20 MG tablet TAKE 1 TABLET BY MOUTH NIGHTLY 30 tablet 5  . Probiotic Product (PROBIOTIC PO) Take 1 capsule by mouth as directed.    . propranolol ER (INDERAL LA) 60 MG 24 hr capsule TAKE 1 CAPSULE BY MOUTH EVERY NIGHT AT BEDTIME 90 capsule 3  . sertraline (ZOLOFT) 100 MG tablet Take 100 mg by mouth daily.    . sertraline (ZOLOFT) 100 MG tablet TAKE 1 TABLET BY MOUTH EVERY MORNING 30 tablet 5  . simvastatin (ZOCOR) 20 MG tablet Take 1 tablet (20 mg total) by mouth every evening. 90 tablet 1  . SUMAtriptan (IMITREX) 100 MG tablet TAKE 1 TABLET BY MOUTH AT ONSET OF MIGRAINE. MAY REPEAT IN 2 HRS, IF NEEDED. MAX DOSE: 2 TABS/DAY. 12 tablet 11  . SYNTHROID 100 MCG tablet TAKE 1 TABLET BY MOUTH DAILY BEFORE BREAKFAST 30 tablet 11  . tiZANidine (ZANAFLEX) 2 MG tablet TAKE 1 TABLET BY MOUTH EVERY 8 HOURS AS NEEDED FOR MUSCLE SPASMS 30 tablet 1  . vitamin B-12 (CYANOCOBALAMIN) 1000  MCG tablet Take 1,000 mcg by mouth daily.    Marland Kitchen zolpidem (AMBIEN) 10 MG tablet TAKE 1/2 - 1 TABLET BY MOUTH EVERY NIGHT AT BEDTIME AS NEEDED 30 tablet 5   No current facility-administered medications on file prior to visit.   No Known Allergies Family History  Problem Relation Age of Onset  . Heart disease Mother        Angina  . Cancer Mother   . Cancer Father   . Heart disease Father   . Cancer Maternal Aunt    PE: BP 110/70   Pulse 75   Ht 5\' 7"  (1.702 m)   Wt 215 lb 3.2 oz (97.6 kg)   SpO2 96%   BMI 33.71 kg/m  Wt Readings from Last 3 Encounters:  10/09/20 215 lb 3.2 oz (97.6 kg)  04/02/20 208 lb (94.3 kg)  12/25/19 210 lb 6.4 oz (95.4 kg)   Constitutional: overweight, in NAD, in wheelchair Eyes: PERRLA, EOMI, no exophthalmos ENT: moist mucous membranes, no neck masses palpable, no cervical lymphadenopathy Cardiovascular: RRR, No MRG, + B pitting LE  Respiratory: CTA B Gastrointestinal: abdomen soft, NT, ND, BS+ Musculoskeletal: no deformities, strength intact in all 4 Skin: moist, warm, pale Neurological: no tremor with outstretched hands, DTR normal in all 4  ASSESSMENT: 1.  Postsurgical Hypothyroidism  PLAN:  1. Postsurgical Hypothyroidism - latest thyroid labs reviewed with pt. >> normal: Lab Results  Component Value Date   TSH 0.43 04/02/2020   - she continues on LT4 100  mcg daily - pt feels good on this dose. - we discussed about taking the thyroid hormone every day, with water, >30 minutes before breakfast, separated by >4 hours from acid reflux medications, calcium, iron, multivitamins. Pt. is taking it correctly. - will check thyroid tests today: TSH and fT4 - If labs are abnormal, she will need to return for repeat TFTs in 1.5 months - OTW, I will see her back in a year  Needs refills.  Component     Latest Ref Rng & Units 10/09/2020  T4,Free(Direct)  0.60 - 1.60 ng/dL 2.54  TSH     9.82 - 6.41 uIU/mL 0.14 (L)  TSH now suppressed >> will  decrease Synthroid dose to 88 and recheck in 1.5 mo.  Carlus Pavlov, MD PhD [facility]

## 2020-10-09 NOTE — Telephone Encounter (Signed)
Patient was no show for Botox appointment today.

## 2020-10-10 ENCOUNTER — Other Ambulatory Visit (HOSPITAL_BASED_OUTPATIENT_CLINIC_OR_DEPARTMENT_OTHER): Payer: Self-pay

## 2020-10-10 MED ORDER — SYNTHROID 88 MCG PO TABS
88.0000 ug | ORAL_TABLET | Freq: Every day | ORAL | 3 refills | Status: DC
  Filled 2020-10-10: qty 45, 45d supply, fill #0
  Filled 2020-12-03: qty 45, 45d supply, fill #1
  Filled 2021-01-20: qty 45, 45d supply, fill #2
  Filled 2021-03-11: qty 45, 45d supply, fill #3

## 2020-10-13 ENCOUNTER — Other Ambulatory Visit: Payer: Self-pay | Admitting: Neurology

## 2020-10-13 MED FILL — Propranolol HCl Cap ER 24HR 60 MG: ORAL | 90 days supply | Qty: 90 | Fill #0 | Status: AC

## 2020-10-14 ENCOUNTER — Other Ambulatory Visit: Payer: Self-pay | Admitting: *Deleted

## 2020-10-14 ENCOUNTER — Other Ambulatory Visit (HOSPITAL_BASED_OUTPATIENT_CLINIC_OR_DEPARTMENT_OTHER): Payer: Self-pay

## 2020-10-14 MED ORDER — ZOLPIDEM TARTRATE 10 MG PO TABS
ORAL_TABLET | ORAL | 5 refills | Status: DC
  Filled 2020-10-14: qty 30, 30d supply, fill #0
  Filled 2020-11-11: qty 30, 30d supply, fill #1
  Filled 2020-12-10: qty 30, 30d supply, fill #2
  Filled 2021-01-07: qty 30, 60d supply, fill #3
  Filled 2021-02-06: qty 30, 30d supply, fill #4

## 2020-10-14 NOTE — Telephone Encounter (Signed)
Attempted to call patient to reschedule. Was not able to reach anyone or LVM. I have placed 6/16 at 3:00 on hold for her.

## 2020-10-15 ENCOUNTER — Other Ambulatory Visit (HOSPITAL_BASED_OUTPATIENT_CLINIC_OR_DEPARTMENT_OTHER): Payer: Self-pay

## 2020-10-16 NOTE — Telephone Encounter (Signed)
Received VM from [place], pt's daughter. I called her back but was not able to reach her, LVM asking her to return my call.

## 2020-10-29 ENCOUNTER — Encounter: Payer: Self-pay | Admitting: Neurology

## 2020-10-29 ENCOUNTER — Telehealth: Payer: Self-pay | Admitting: *Deleted

## 2020-10-29 ENCOUNTER — Other Ambulatory Visit (HOSPITAL_BASED_OUTPATIENT_CLINIC_OR_DEPARTMENT_OTHER): Payer: Self-pay

## 2020-10-29 ENCOUNTER — Other Ambulatory Visit: Payer: Self-pay | Admitting: Neurology

## 2020-10-29 ENCOUNTER — Ambulatory Visit: Payer: PPO | Admitting: Neurology

## 2020-10-29 VITALS — BP 119/85 | HR 74 | Ht 67.0 in | Wt 205.0 lb

## 2020-10-29 DIAGNOSIS — G629 Polyneuropathy, unspecified: Secondary | ICD-10-CM

## 2020-10-29 DIAGNOSIS — G43709 Chronic migraine without aura, not intractable, without status migrainosus: Secondary | ICD-10-CM | POA: Diagnosis not present

## 2020-10-29 DIAGNOSIS — G909 Disorder of the autonomic nervous system, unspecified: Secondary | ICD-10-CM

## 2020-10-29 DIAGNOSIS — R269 Unspecified abnormalities of gait and mobility: Secondary | ICD-10-CM | POA: Diagnosis not present

## 2020-10-29 MED ORDER — ONABOTULINUMTOXINA 100 UNITS IJ SOLR
200.0000 [IU] | Freq: Once | INTRAMUSCULAR | Status: AC
  Administered 2020-10-29: 200 [IU] via INTRAMUSCULAR

## 2020-10-29 MED ORDER — NURTEC 75 MG PO TBDP
ORAL_TABLET | ORAL | 6 refills | Status: DC
  Filled 2020-10-29: qty 8, 30d supply, fill #0

## 2020-10-29 NOTE — Progress Notes (Signed)
PATIENT: Mallory Durham DOB: [birth year]  Chief Complaint  Patient presents with   Procedure    Botox     HISTORICAL  Mallory Durham is a 80 year old female, seen in refer by primary care PA Fortino Sic, for evaluation of migraine headaches, chronic neck pain, initial evaluation was on June 01, 2017.  Reviewed and summarized the referring note, she has past medical history of hypothyroidism, on supplement, hyperlipidemia, chronic neck pain, chronic renal insufficiency,  Since 2016, she noticed progressive worsening dizziness, to the point now, she sits down most of the time, is not able to exercise, in addition, she is under a lot of stress, tried to see her husband at the nursing home on a daily basis.  She drinks 2 of 12 ounce of water bottle every day, but she complains lightheadedness shortly after standing up, mild improvement walking short distance, but with prolonged walking and standing, she would develop this washout sensation, lightheadedness, as if she is going to faint, she has to sit down to improve her symptoms.  During today's examination, she was found to have significant orthostatic blood pressure change in the setting of already low blood pressure, lying down blood pressure 97/70, heart rate of 83, standing up systolic blood pressure was 50 or less, required multiple measurement to register, she does become symptomatic, especially after standing up for more than 3 minutes.  Her symptoms gradually improved after lying flat.  Previously she was given prescription of midodrine 2.5 mg to get in the early morning, and before bed, there was no significant improvement noted.  She also noted gradual onset memory loss, difficulty focusing, she has history of migraine headache, increased migraine over the past few months, she could not tolerate Imitrex 100 mg as needed due to heart palpitation, she usually take half of 100 mg tablets, if needed, she may repeat in couple  hours, which take away the headache in few hours,  She is quite disabled by her dizziness, has to stay seated most of the time, she denies bilateral lower extremity swelling, no significant sensory loss.  UPDATE July 18 2017: MRI of the brain in February 2019, generalized atrophy, especially at the right perisylvian fissure area, mild supratentorium small vessel disease, there is no acute abnormality.  Laboratory evaluation showed A1c of 5.2, normal negative ANA, copper, C-reactive protein, ESR, RPR, B12, CMP, lipid profile, BNP was elevated 246   Significant low vitamin D 5.2,  Echocardiogram showed ejection fraction of 55-60%, wall motion was normal, aortic valve showed no stenosis, ventricular size was normal, systolic function was normal  She is taking gabapentin 658m bid, midodrine 10 mg 3 times a day, last dose was before she goes to bed, she also complains of chronic insomnia, taking Ambien 10 mg at nighttime  She still has depression, anxiety, she has frequent headaches, once or twice each day, she has been 50 mg as needed, 50 mg or Maxalt as needed for headaches, which helps her headache some  UPDATE November 01 2017: She is accompanied by her daughter Mallory Durham today's clinical visit, functional status has much declined, despite midodrine 10 mg 3 times a day, and the Mestinon 60 mg 3 times a day, she complains of lightheadedness, dizziness when getting up just a few minutes, today she had significant orthostatic pressure dropped [id number] systolic 60 after standing up, difficult to register diastolic pressure.   She complains lightheadedness fainting sensation after lying down for a few minutes,blood pressure points back to  120/80, her symptoms has much improved  She also complains of frequent migraine headaches, sometimes woke up with migraine headaches, taking Imitrex as needed  Chronic insomnia, Ambien 5 to 10 mg as needed  UPDATE Sept 9 2019: She is now taking  fludrocortisone 0.1 mg 3 times a day, which has really helped her symptoms, along with Mestinon 60 mg daily, she is scheduled to see [facility] on February 07, 2018,  She complains of chronic migraine headaches for many years,3-4 times each week, right lateralized severe pounding headaches, with associated light noise sensitivity, Imitrex 100 mg as needed was helpful, still her headache last about 6 hours,  UPDATE May 02 2018: She is now taking fludrocortisone 0.3 mg 3 times a day, Mestinon 60 mg 3 times a day, midodrine was increased from 2.5 to 5 mg 3 times a day, orthostatic symptoms has much improved,  She has almost daily headache, more on right temporal region, imitrex 110m 1/2 tab daily,   Reported 40% improvement with Botox injection, she has less headache, less severe, but benefit short lasting,  UPDATE Apr 02 2020: She suffered COVID-19 in early October 2021, recovering well, still complains of significant fatigue, orthostatic dizziness, fainting spells,  Reviewed evaluation by cardiologist Dr. SVirl Axeon December 25, 2019, agree with current medication management, no longer take Mestinon, f still on fludrocortisone 0.1 mg 3 times a day, midodrine 10 mg 2-3 times a day,  Skin itching is well managed by gabapentin 300 mg 3 times a day, only occasionally Benadryl,  Migraine headache is overall under good control, Botox has really helped,  UPDATE Jul 02 2020: She complains of worsening orthostatic hypotension, dizziness, taking midodrine 10 mg twice a day, today sitting blood pressure 130/80, standing up 90/60  She continue have frequent headaches but improved with Botox injection, it is less frequent, easier to treat,  In addition, she reported 1 episode of sudden onset of dizziness, vertigo, nausea, double vision, followed by headache, lasting for 5 hours, this happened 2 to 3 weeks ago, now she is back to her baseline, had history of similar occurrence in the  past,  Update October 29, 2020: She is accompanied by her daughter, reported worsening headaches, worsening dizziness, lack of stamina, take frequent Excedrin Migraine, Botox as migraine prevention still works for her, but still the always 2-3 migraine each week,  REVIEW OF SYSTEMS: Full 14 system review of systems performed and notable only for above  All rest review of system were negative  ALLERGIES: No Known Allergies   HOME MEDICATIONS: Current Outpatient Medications  Medication Sig Dispense Refill   albuterol (VENTOLIN HFA) 108 (90 Base) MCG/ACT inhaler INHALE 2 PUFFS INTO THE LUNGS EVERY 6 HOURS AS NEEDED FOR WHEEZING OR SHORTNESS OF BREATH 18 g 11   aspirin-acetaminophen-caffeine (EXCEDRIN MIGRAINE) 250-250-65 MG tablet Take by mouth every 6 (six) hours as needed for headache.     botulinum toxin Type A (BOTOX) 100 units SOLR injection Inject IM every 3 months by MD in the office 2 vial 3   buPROPion (WELLBUTRIN XL) 150 MG 24 hr tablet TAKE 1 TABLET BY MOUTH EVERY MORNING 30 tablet 5   Cholecalciferol (D3-1000 PO) Take 1 tablet by mouth daily.     fludrocortisone (FLORINEF) 0.1 MG tablet TAKE 1 TABLET BY MOUTH THREE TIMES DAILY 270 tablet 4   gabapentin (NEURONTIN) 100 MG capsule TAKE 3 CAPSULES (300 MG TOTAL) BY MOUTH 3 (THREE) TIMES DAILY. 810 capsule 4   midodrine (PROAMATINE)  10 MG tablet TAKE 1 TABLET (10 MG TOTAL) BY MOUTH 3 (THREE) TIMES DAILY. PT NEEDS TO KEEP APPT WITH PROVIDER IN JUNE FOR FURTHER REFILLS 270 tablet 4   Multiple Vitamins-Minerals (PRESERVISION AREDS 2) CAPS Take 2 capsules by mouth.     nortriptyline (PAMELOR) 10 MG capsule TAKE 2 CAPSULES (20 MG TOTAL) BY MOUTH AT BEDTIME. 60 capsule 11   ondansetron (ZOFRAN-ODT) 8 MG disintegrating tablet Take by mouth.     potassium chloride (KLOR-CON) 10 MEQ tablet TAKE 2 TABLETS BY MOUTH 2 TIMES DAILY 120 tablet 11   pravastatin (PRAVACHOL) 20 MG tablet TAKE 1 TABLET BY MOUTH NIGHTLY 30 tablet 5   Probiotic Product  (PROBIOTIC PO) Take 1 capsule by mouth as directed.     propranolol ER (INDERAL LA) 60 MG 24 hr capsule TAKE 1 CAPSULE BY MOUTH EVERY NIGHT AT BEDTIME 90 capsule 3   sertraline (ZOLOFT) 100 MG tablet Take 100 mg by mouth daily.     SUMAtriptan (IMITREX) 100 MG tablet TAKE 1 TABLET BY MOUTH AT ONSET OF MIGRAINE. MAY REPEAT IN 2 HRS, IF NEEDED. MAX DOSE: 2 TABS/DAY. 12 tablet 11   SYNTHROID 88 MCG tablet Take 1 tablet (88 mcg total) by mouth daily before breakfast. 45 tablet 3   tiZANidine (ZANAFLEX) 2 MG tablet TAKE 1 TABLET BY MOUTH EVERY 8 HOURS AS NEEDED FOR MUSCLE SPASMS 30 tablet 1   vitamin B-12 (CYANOCOBALAMIN) 1000 MCG tablet Take 1,000 mcg by mouth daily.     zolpidem (AMBIEN) 10 MG tablet TAKE 1/2 - 1 TABLET BY MOUTH EVERY NIGHT AT BEDTIME AS NEEDED 30 tablet 5   No current facility-administered medications for this visit.    PAST MEDICAL HISTORY: Past Medical History:  Diagnosis Date   2019 novel coronavirus disease (COVID-19)    Anxiety    Confusion, hx of, without neuro findings    Depression    Hx of vertigo    Hyperlipidemia    Hypothyroid    Left shoulder pain    Migraines    Neck pain    Orthostatic hypotension    Renal insufficiency     PAST SURGICAL HISTORY: Past Surgical History:  Procedure Laterality Date   ABDOMINAL HYSTERECTOMY     ABDOMINAL HYSTERECTOMY     BREAST LUMPECTOMY     CHOLECYSTECTOMY     JOINT REPLACEMENT     THYROID SURGERY     TONSILLECTOMY      FAMILY HISTORY: Family History  Problem Relation Age of Onset   Heart disease Mother        Angina   Cancer Mother    Cancer Father    Heart disease Father    Cancer Maternal Aunt     SOCIAL HISTORY:  Social History   Socioeconomic History   Marital status: Married    Spouse name: Not on file   Number of children: 2   Years of education: 16   Highest education level: Bachelor's degree (e.g., BA, AB, BS)  Occupational History    Employer: RETIRED  Tobacco Use   Smoking status:  Never   Smokeless tobacco: Never  Vaping Use   Vaping Use: Never used  Substance and Sexual Activity   Alcohol use: No   Drug use: No   Sexual activity: Not on file  Other Topics Concern   Not on file  Social History Narrative   Lives at home alone.   Right-handed.   1-2 cups caffeine per day.   Social Determinants of  Health   Financial Resource Strain: Not on file  Food Insecurity: Not on file  Transportation Needs: Not on file  Physical Activity: Not on file  Stress: Not on file  Social Connections: Not on file  Intimate Partner Violence: Not on file     PHYSICAL EXAM  Body mass index is 32.11 kg/m. Blood pressure 119/85, heart rate of 74,   Gen: NAD, conversant, well nourised, obese, well groomed                       NEUROLOGICAL EXAM:  MENTAL STATUS: Speech/cognition: Awake alert oriented to history taking and casual conversation   CRANIAL NERVES: CN II: Visual fields are full to confrontation. Pupils are round equal and briskly reactive to light. CN III, IV, VI: extraocular movement are normal. No ptosis. CN V: Facial sensation is intact to pinprick in all 3 divisions bilaterally. Corneal responses are intact.  CN VII: Face is symmetric with normal eye closure and smile. CN VIII: Hearing is normal to rubbing fingers CN IX, X: Palate elevates symmetrically. Phonation is normal. CN XI: Head turning and shoulder shrug are intact  MOTOR: There is no pronator drift of out-stretched arms. Muscle bulk and tone are normal. Muscle strength is normal.  REFLEXES: Reflexes are hypoactive and symmetric at the biceps, triceps, knees, and ankles. Plantar responses are flexor.  SENSORY: Mildly less dependent sensory changes to pinprick.  COORDINATION: No limb or truncal ataxia noted  GAIT/STANCE: She can get up without pushing on chair arm, steady, Romberg is absent.   DIAGNOSTIC DATA (LABS, IMAGING, TESTING) - I reviewed patient records, labs, notes,  testing and imaging myself where available.   ASSESSMENT AND PLAN  Mallory Durham is a 80 y.o. female   Autonomic failure  With significant orthostatic blood pressure changes,  No treatable etiology found, most consistent with central nervous system degenerative disorder,  She also reported occasional pseudobulbar phenomena  Patient is severely symptomatic from her profound autonomic failure, significant orthostatic blood pressure changes, essentially disabled from her symptoms,   Midodrine 37m tid, dose should be before 3 PM to avoid supine hypertension, continue fludrocortisone 0.1 mg 3 times a day, emphasized importance of increased water intake,  New onset vertigo, double vision in February 2022  Personally reviewed MRI of the brain without contrast on July 17, 2020, mild small vessel disease no acute abnormality  Chronic migraine headaches  Imitrex 100 mg as needed  Botox injection for chronic migraine prevention, injection was performed according to [place] protocol,  5 units of Botox was injected into each side, for 31 injection sites, total of 155 units  Bilateral frontalis 4 injection sites Bilateral corrugate 2 injection sites Procerus 1 injection sites. Bilateral temporalis 8 injection sites Bilateral occipitalis 6 injection sites Bilateral cervical paraspinals 4 injection sites Bilateral upper trapezius 6 injection sites  Extra 45 unites were injected into bilateral masseters, and the right temporoparietal region   We also spent lengthy time discussed other migraine preventive medications, decided to try Nurtec 75 mg every other day as preventive medication, 18 tablets a month, occasionally as needed for abortive treatment, may combine with Imitrex, Zofran, tizanidine, Benadryl, for severe prolonged headaches,  Mallory Durham M.D. Ph.D.  [facility] [address] [place] [address]Ph: [phone]: [phone] CC: WFortino Sic PUtah

## 2020-10-29 NOTE — Telephone Encounter (Signed)
PA for Nurtec 75mg  started on covermymeds (key: [id number]). Pt has pharmacy coverage through [employer] [phone]). Decision pending.

## 2020-10-29 NOTE — Progress Notes (Signed)
**  Botox 100 units x 2 vials, NDC 0023-1145-01, Lot [id number], Exp 09/2022, office supply.//mck,rn** 

## 2020-10-29 NOTE — Patient Instructions (Addendum)
Home headache cocktail:  Imitrex Zofran Tizanidine Benadryl  Excedrin migraine less than = 2/week   It is Ok to use.  Meds ordered this encounter  Medications   Rimegepant Sulfate (NURTEC) 75 MG TBDP    Sig: 75mg  every other day    Dispense:  18 tablet    Refill:  6

## 2020-11-03 ENCOUNTER — Other Ambulatory Visit (HOSPITAL_BASED_OUTPATIENT_CLINIC_OR_DEPARTMENT_OTHER): Payer: Self-pay

## 2020-11-03 MED ORDER — NURTEC 75 MG PO TBDP
ORAL_TABLET | ORAL | 6 refills | Status: DC
  Filled 2020-11-03 – 2020-11-11 (×2): qty 16, 30d supply, fill #0
  Filled 2020-12-10 – 2020-12-16 (×2): qty 16, 30d supply, fill #1

## 2020-11-03 NOTE — Addendum Note (Signed)
Addended by: Lilla Shook on: 11/03/2020 07:51 AM   Modules accepted: Orders

## 2020-11-03 NOTE — Telephone Encounter (Addendum)
PA Case: [id number] approved through 05/16/21. Insurance will cover #16/30.

## 2020-11-10 ENCOUNTER — Other Ambulatory Visit (HOSPITAL_BASED_OUTPATIENT_CLINIC_OR_DEPARTMENT_OTHER): Payer: Self-pay

## 2020-11-10 DIAGNOSIS — H5212 Myopia, left eye: Secondary | ICD-10-CM | POA: Diagnosis not present

## 2020-11-10 DIAGNOSIS — H52223 Regular astigmatism, bilateral: Secondary | ICD-10-CM | POA: Diagnosis not present

## 2020-11-10 DIAGNOSIS — H353132 Nonexudative age-related macular degeneration, bilateral, intermediate dry stage: Secondary | ICD-10-CM | POA: Diagnosis not present

## 2020-11-10 DIAGNOSIS — H40013 Open angle with borderline findings, low risk, bilateral: Secondary | ICD-10-CM | POA: Diagnosis not present

## 2020-11-10 DIAGNOSIS — H5213 Myopia, bilateral: Secondary | ICD-10-CM | POA: Diagnosis not present

## 2020-11-10 DIAGNOSIS — H524 Presbyopia: Secondary | ICD-10-CM | POA: Diagnosis not present

## 2020-11-10 DIAGNOSIS — H04123 Dry eye syndrome of bilateral lacrimal glands: Secondary | ICD-10-CM | POA: Diagnosis not present

## 2020-11-10 DIAGNOSIS — G43819 Other migraine, intractable, without status migrainosus: Secondary | ICD-10-CM | POA: Diagnosis not present

## 2020-11-11 ENCOUNTER — Telehealth: Payer: Self-pay | Admitting: Neurology

## 2020-11-11 ENCOUNTER — Other Ambulatory Visit (HOSPITAL_BASED_OUTPATIENT_CLINIC_OR_DEPARTMENT_OTHER): Payer: Self-pay

## 2020-11-11 MED FILL — Pravastatin Sodium Tab 20 MG: ORAL | 30 days supply | Qty: 30 | Fill #2 | Status: AC

## 2020-11-11 NOTE — Telephone Encounter (Signed)
Cala Bradford from [employer] called in regards to VO for pts in home physical therapy. Would like a call back on phone number [phone].

## 2020-11-11 NOTE — Telephone Encounter (Signed)
I returned the call to [place] from [employer] (from [employer]). She needs to know where the patient's home health referral was sent.   She would like a call back at [phone] to be updated with the company's information.

## 2020-11-12 NOTE — Telephone Encounter (Signed)
It does not look like Annabelle Harman was able to send it anywhere. I have sent a message to [facility] with [facility] to see what their availability is.

## 2020-11-18 ENCOUNTER — Other Ambulatory Visit (HOSPITAL_BASED_OUTPATIENT_CLINIC_OR_DEPARTMENT_OTHER): Payer: Self-pay

## 2020-11-18 ENCOUNTER — Other Ambulatory Visit: Payer: Self-pay | Admitting: Neurology

## 2020-11-18 NOTE — Telephone Encounter (Signed)
Meg with Iantha Fallen advises that it is possible [facility] may be able to accept patient. I will reach out to them to check.

## 2020-11-19 ENCOUNTER — Other Ambulatory Visit (HOSPITAL_BASED_OUTPATIENT_CLINIC_OR_DEPARTMENT_OTHER): Payer: Self-pay

## 2020-11-19 MED ORDER — GABAPENTIN 100 MG PO CAPS
ORAL_CAPSULE | ORAL | 4 refills | Status: DC
  Filled 2020-11-19: qty 270, 30d supply, fill #0
  Filled 2020-12-29: qty 270, 30d supply, fill #1
  Filled 2021-02-06: qty 270, 30d supply, fill #2
  Filled 2021-03-20: qty 270, 30d supply, fill #3
  Filled 2021-04-30: qty 270, 30d supply, fill #4
  Filled 2021-06-09: qty 270, 30d supply, fill #5
  Filled 2021-07-31: qty 270, 30d supply, fill #6
  Filled 2021-09-10: qty 270, 30d supply, fill #7

## 2020-11-26 ENCOUNTER — Other Ambulatory Visit (HOSPITAL_BASED_OUTPATIENT_CLINIC_OR_DEPARTMENT_OTHER): Payer: Self-pay

## 2020-11-27 NOTE — Telephone Encounter (Signed)
Contacted Monia Pouch w/ [facility] to check their availability.

## 2020-11-27 NOTE — Telephone Encounter (Signed)
Mallory Durham states [facility] is not able to accept patient due to staffing concerns & delays.

## 2020-12-03 ENCOUNTER — Telehealth: Payer: Self-pay | Admitting: Neurology

## 2020-12-03 ENCOUNTER — Other Ambulatory Visit (HOSPITAL_BASED_OUTPATIENT_CLINIC_OR_DEPARTMENT_OTHER): Payer: Self-pay

## 2020-12-03 NOTE — Telephone Encounter (Signed)
Returned call to patient.  Her referral to [facility] was made in April 2022 but keep telling her they need a form from Dr. Terrace Arabia to have it started.  She does not know what form it is.   She said her insurance provided a list to her and she said she like [place].    Message sent to Gerilyn Pilgrim with request.

## 2020-12-03 NOTE — Telephone Encounter (Signed)
Please see my telephone note from 6/28. I have been trying to locate an agency that will accept her. I have been turned down twice. I will check with [facility].

## 2020-12-03 NOTE — Telephone Encounter (Signed)
Pt called needing to speak to the RN regarding her Home Health. Please advise.

## 2020-12-08 ENCOUNTER — Other Ambulatory Visit (HOSPITAL_BASED_OUTPATIENT_CLINIC_OR_DEPARTMENT_OTHER): Payer: Self-pay

## 2020-12-08 DIAGNOSIS — E876 Hypokalemia: Secondary | ICD-10-CM | POA: Diagnosis not present

## 2020-12-08 DIAGNOSIS — Z7185 Encounter for immunization safety counseling: Secondary | ICD-10-CM | POA: Diagnosis not present

## 2020-12-08 DIAGNOSIS — E785 Hyperlipidemia, unspecified: Secondary | ICD-10-CM | POA: Diagnosis not present

## 2020-12-08 DIAGNOSIS — E559 Vitamin D deficiency, unspecified: Secondary | ICD-10-CM | POA: Diagnosis not present

## 2020-12-08 DIAGNOSIS — N1831 Chronic kidney disease, stage 3a: Secondary | ICD-10-CM | POA: Diagnosis not present

## 2020-12-08 DIAGNOSIS — F4323 Adjustment disorder with mixed anxiety and depressed mood: Secondary | ICD-10-CM | POA: Diagnosis not present

## 2020-12-08 MED ORDER — SERTRALINE HCL 100 MG PO TABS
ORAL_TABLET | ORAL | 5 refills | Status: DC
  Filled 2020-12-08: qty 30, 30d supply, fill #0
  Filled 2021-03-11: qty 30, 30d supply, fill #1
  Filled 2021-05-26: qty 30, 30d supply, fill #2

## 2020-12-08 MED ORDER — BUPROPION HCL ER (XL) 150 MG PO TB24
150.0000 mg | ORAL_TABLET | Freq: Every morning | ORAL | 5 refills | Status: DC
  Filled 2020-12-08: qty 30, 30d supply, fill #0
  Filled 2021-03-31: qty 30, 30d supply, fill #1

## 2020-12-08 NOTE — Telephone Encounter (Signed)
Spoke with patient, she is aware someone with [facility] will be calling her.

## 2020-12-08 NOTE — Telephone Encounter (Signed)
Called patient and LVM asking her to call me back to discuss home health placement. I also called and spoke with patient's daughter Angelique Blonder to let her know referral was accepted by [facility]. She was appreciative & will let patient know.

## 2020-12-10 ENCOUNTER — Other Ambulatory Visit (HOSPITAL_BASED_OUTPATIENT_CLINIC_OR_DEPARTMENT_OTHER): Payer: Self-pay

## 2020-12-11 NOTE — Telephone Encounter (Signed)
Per [place] at [employer], insurance will not pay for nursing/bathing as it is not a skillable need. She states her office accepted the referral for PT/OT and OT can help with bathing/other ADLs. I placed a new referral reflecting this.

## 2020-12-15 ENCOUNTER — Telehealth: Payer: Self-pay | Admitting: Neurology

## 2020-12-15 NOTE — Telephone Encounter (Signed)
Pt is asking for a call from RN to discuss concerns about Rimegepant Sulfate (NURTEC) 75 MG TBDP and pt is wanting to discuss the Home Care she is suppose to receive.

## 2020-12-15 NOTE — Telephone Encounter (Signed)
I spoke to the patient. The first month she received Nurtec, she had a $100 co-pay. This month she was told me the cost would be over $300. She is unable to afford this amount. I will need to contact the pharmacy to further investigate.  Additionally, she had [facility] PT come to her house on 12/13/20. She was informed they do not offer assistance with bathing. States having an aid to come several times each week to help w/ bathing is the service she really needs. I will need to check on this for her.

## 2020-12-16 ENCOUNTER — Other Ambulatory Visit (HOSPITAL_BASED_OUTPATIENT_CLINIC_OR_DEPARTMENT_OTHER): Payer: Self-pay

## 2020-12-16 NOTE — Telephone Encounter (Addendum)
Unfortunately, an aid is not covered through home health services. I called [facility] at [phone]. They cover patient's with only Medicare/Medicaid for an aid. The rep said often times, patients and families have to pay out-of-pocket for this type of help. If the patient has a supplement plan, they have to go through them to see if aids are covered to assist w/ ADLs. The patient has [employer] and they will need to check with them directly. They may also call [facility] and speak to a Child psychotherapist in [employer] for further resources. If her supplement plan allows for an aid, then we are happy to refer to any agency. The patient says she just needs someone several times a week to especially help with bathing. I spoke to her daughter Angelique Blonder on [place]) who will help her research this further and let us know if any further referrals are needed from our office.  Nurtec has a PA in place (taking for preventative). Unfortunately, the patient's co-pay is over $300. Per the pharmacy, she may in the donut hole. The pharmacy also checked for any eligible co-pay cards and she does not qualify. The pharmacist said the newer migraine medication would also result in a high co-pay.  ____________________________________ I spoke to the patient and provided her with this information. She is going to have to stop the Nurtec because it is unaffordable. She would like to keep her pending appt for Botox 0n 01/28/21. Botox is billed through her medical plan so she can manage it. She will also continue her propranolol and nortriptyline.

## 2020-12-19 ENCOUNTER — Other Ambulatory Visit (HOSPITAL_BASED_OUTPATIENT_CLINIC_OR_DEPARTMENT_OTHER): Payer: Self-pay

## 2020-12-19 MED FILL — Potassium Chloride Microencapsulated Crys ER Tab 10 mEq: ORAL | 30 days supply | Qty: 120 | Fill #1 | Status: AC

## 2020-12-22 ENCOUNTER — Other Ambulatory Visit (HOSPITAL_BASED_OUTPATIENT_CLINIC_OR_DEPARTMENT_OTHER): Payer: Self-pay

## 2020-12-22 MED ORDER — PRAVASTATIN SODIUM 20 MG PO TABS
ORAL_TABLET | ORAL | 5 refills | Status: DC
  Filled 2020-12-22: qty 30, 30d supply, fill #0
  Filled 2021-01-20: qty 30, 30d supply, fill #1
  Filled 2021-02-13: qty 30, 30d supply, fill #2
  Filled 2021-03-31: qty 30, 30d supply, fill #3
  Filled 2021-04-23: qty 30, 30d supply, fill #4
  Filled 2021-06-18: qty 30, 30d supply, fill #5

## 2020-12-29 ENCOUNTER — Other Ambulatory Visit (HOSPITAL_BASED_OUTPATIENT_CLINIC_OR_DEPARTMENT_OTHER): Payer: Self-pay

## 2021-01-01 ENCOUNTER — Other Ambulatory Visit (HOSPITAL_BASED_OUTPATIENT_CLINIC_OR_DEPARTMENT_OTHER): Payer: Self-pay

## 2021-01-01 DIAGNOSIS — N3001 Acute cystitis with hematuria: Secondary | ICD-10-CM | POA: Diagnosis not present

## 2021-01-01 DIAGNOSIS — R399 Unspecified symptoms and signs involving the genitourinary system: Secondary | ICD-10-CM | POA: Diagnosis not present

## 2021-01-01 MED ORDER — NITROFURANTOIN MONOHYD MACRO 100 MG PO CAPS
ORAL_CAPSULE | ORAL | 1 refills | Status: DC
  Filled 2021-01-01: qty 14, 7d supply, fill #0

## 2021-01-01 MED ORDER — URIBEL 118 MG PO CAPS
ORAL_CAPSULE | ORAL | 1 refills | Status: DC
  Filled 2021-01-01: qty 28, 7d supply, fill #0

## 2021-01-06 ENCOUNTER — Other Ambulatory Visit (HOSPITAL_BASED_OUTPATIENT_CLINIC_OR_DEPARTMENT_OTHER): Payer: Self-pay

## 2021-01-07 ENCOUNTER — Other Ambulatory Visit (HOSPITAL_BASED_OUTPATIENT_CLINIC_OR_DEPARTMENT_OTHER): Payer: Self-pay

## 2021-01-08 ENCOUNTER — Other Ambulatory Visit (HOSPITAL_BASED_OUTPATIENT_CLINIC_OR_DEPARTMENT_OTHER): Payer: Self-pay

## 2021-01-14 ENCOUNTER — Ambulatory Visit: Payer: PPO | Admitting: Neurology

## 2021-01-16 ENCOUNTER — Other Ambulatory Visit (HOSPITAL_BASED_OUTPATIENT_CLINIC_OR_DEPARTMENT_OTHER): Payer: Self-pay

## 2021-01-16 ENCOUNTER — Other Ambulatory Visit: Payer: Self-pay | Admitting: Neurology

## 2021-01-16 MED ORDER — PROPRANOLOL HCL ER 60 MG PO CP24
ORAL_CAPSULE | Freq: Every day | ORAL | 3 refills | Status: DC
  Filled 2021-01-23: qty 90, 90d supply, fill #0
  Filled 2021-04-23: qty 90, 90d supply, fill #1

## 2021-01-16 MED ORDER — MIDODRINE HCL 10 MG PO TABS
10.0000 mg | ORAL_TABLET | Freq: Three times a day (TID) | ORAL | 3 refills | Status: DC
  Filled 2021-01-23: qty 270, 90d supply, fill #0
  Filled 2021-09-10: qty 270, 90d supply, fill #1

## 2021-01-20 ENCOUNTER — Other Ambulatory Visit (HOSPITAL_BASED_OUTPATIENT_CLINIC_OR_DEPARTMENT_OTHER): Payer: Self-pay

## 2021-01-23 ENCOUNTER — Other Ambulatory Visit (HOSPITAL_BASED_OUTPATIENT_CLINIC_OR_DEPARTMENT_OTHER): Payer: Self-pay

## 2021-01-28 ENCOUNTER — Other Ambulatory Visit (HOSPITAL_BASED_OUTPATIENT_CLINIC_OR_DEPARTMENT_OTHER): Payer: Self-pay

## 2021-01-28 ENCOUNTER — Ambulatory Visit: Payer: PPO | Admitting: Neurology

## 2021-01-28 ENCOUNTER — Encounter: Payer: Self-pay | Admitting: Neurology

## 2021-01-28 ENCOUNTER — Telehealth: Payer: Self-pay | Admitting: Neurology

## 2021-01-28 ENCOUNTER — Other Ambulatory Visit: Payer: Self-pay

## 2021-01-28 VITALS — BP 138/74 | HR 75

## 2021-01-28 DIAGNOSIS — G901 Familial dysautonomia [Riley-Day]: Secondary | ICD-10-CM

## 2021-01-28 DIAGNOSIS — G43709 Chronic migraine without aura, not intractable, without status migrainosus: Secondary | ICD-10-CM | POA: Diagnosis not present

## 2021-01-28 DIAGNOSIS — I951 Orthostatic hypotension: Secondary | ICD-10-CM

## 2021-01-28 MED ORDER — AJOVY 225 MG/1.5ML ~~LOC~~ SOAJ
1.5000 mL | SUBCUTANEOUS | 11 refills | Status: DC
  Filled 2021-01-28: qty 1.5, 30d supply, fill #0

## 2021-01-28 NOTE — Telephone Encounter (Signed)
Mallory Durham from [facility] called stating the Fremanezumab-vfrm (AJOVY) 225 MG/1.5ML SOAJ is not being covered by pt's insurance it's not even giving the option to send  PA. Requesting a call back.

## 2021-01-28 NOTE — Progress Notes (Signed)
Botox- 200 units x 2 vials Lot: [id number] Expiration: 12/2022 NDC: 6004-5997-74  B/B

## 2021-01-28 NOTE — Progress Notes (Signed)
PATIENT: Mallory Durham DOB: 09-05-1940  Chief Complaint  Patient presents with   Procedure     HISTORICAL  Mallory Durham is a 80 year old female, seen in refer by primary care PA Fortino Sic, for evaluation of migraine headaches, chronic neck pain, initial evaluation was on June 01, 2017.  Reviewed and summarized the referring note, she has past medical history of hypothyroidism, on supplement, hyperlipidemia, chronic neck pain, chronic renal insufficiency,  Since 2016, she noticed progressive worsening dizziness, to the point now, she sits down most of the time, is not able to exercise, in addition, she is under a lot of stress, tried to see her husband at the nursing home on a daily basis.  She drinks 2 of 12 ounce of water bottle every day, but she complains lightheadedness shortly after standing up, mild improvement walking short distance, but with prolonged walking and standing, she would develop this washout sensation, lightheadedness, as if she is going to faint, she has to sit down to improve her symptoms.  During today's examination, she was found to have significant orthostatic blood pressure change in the setting of already low blood pressure, lying down blood pressure 97/70, heart rate of 83, standing up systolic blood pressure was 50 or less, required multiple measurement to register, she does become symptomatic, especially after standing up for more than 3 minutes.  Her symptoms gradually improved after lying flat.  Previously she was given prescription of midodrine 2.5 mg to get in the early morning, and before bed, there was no significant improvement noted.  She also noted gradual onset memory loss, difficulty focusing, she has history of migraine headache, increased migraine over the past few months, she could not tolerate Imitrex 100 mg as needed due to heart palpitation, she usually take half of 100 mg tablets, if needed, she may repeat in couple hours,  which take away the headache in few hours,  She is quite disabled by her dizziness, has to stay seated most of the time, she denies bilateral lower extremity swelling, no significant sensory loss.  UPDATE July 18 2017: MRI of the brain in February 2019, generalized atrophy, especially at the right perisylvian fissure area, mild supratentorium small vessel disease, there is no acute abnormality.  Laboratory evaluation showed A1c of 5.2, normal negative ANA, copper, C-reactive protein, ESR, RPR, B12, CMP, lipid profile, BNP was elevated 246   Significant low vitamin D 5.2,  Echocardiogram showed ejection fraction of 55-60%, wall motion was normal, aortic valve showed no stenosis, ventricular size was normal, systolic function was normal  She is taking gabapentin 645m bid, midodrine 10 mg 3 times a day, last dose was before she goes to bed, she also complains of chronic insomnia, taking Ambien 10 mg at nighttime  She still has depression, anxiety, she has frequent headaches, once or twice each day, she has been 50 mg as needed, 50 mg or Maxalt as needed for headaches, which helps her headache some  UPDATE November 01 2017: She is accompanied by her daughter Mallory Durham today's clinical visit, functional status has much declined, despite midodrine 10 mg 3 times a day, and the Mestinon 60 mg 3 times a day, she complains of lightheadedness, dizziness when getting up just a few minutes, today she had significant orthostatic pressure dropped fGBEE/100/71to systolic 60 after standing up, difficult to register diastolic pressure.   She complains lightheadedness fainting sensation after lying down for a few minutes,blood pressure points back to 120/80, her symptoms has  much improved  She also complains of frequent migraine headaches, sometimes woke up with migraine headaches, taking Imitrex as needed  Chronic insomnia, Ambien 5 to 10 mg as needed  UPDATE Sept 9 2019: She is now taking fludrocortisone 0.1  mg 3 times a day, which has really helped her symptoms, along with Mestinon 60 mg daily, she is scheduled to see [facility] on February 07, 2018,  She complains of chronic migraine headaches for many years,3-4 times each week, right lateralized severe pounding headaches, with associated light noise sensitivity, Imitrex 100 mg as needed was helpful, still her headache last about 6 hours,  UPDATE May 02 2018: She is now taking fludrocortisone 0.3 mg 3 times a day, Mestinon 60 mg 3 times a day, midodrine was increased from 2.5 to 5 mg 3 times a day, orthostatic symptoms has much improved,  She has almost daily headache, more on right temporal region, imitrex 113m 1/2 tab daily,   Reported 40% improvement with Botox injection, she has less headache, less severe, but benefit short lasting,  UPDATE Apr 02 2020: She suffered COVID-19 in early October 2021, recovering well, still complains of significant fatigue, orthostatic dizziness, fainting spells,  Reviewed evaluation by cardiologist Dr. SVirl Axeon December 25, 2019, agree with current medication management, no longer take Mestinon, f still on fludrocortisone 0.1 mg 3 times a day, midodrine 10 mg 2-3 times a day,  Skin itching is well managed by gabapentin 300 mg 3 times a day, only occasionally Benadryl,  Migraine headache is overall under good control, Botox has really helped,  UPDATE Jul 02 2020: She complains of worsening orthostatic hypotension, dizziness, taking midodrine 10 mg twice a day, today sitting blood pressure 130/80, standing up 90/60  She continue have frequent headaches but improved with Botox injection, it is less frequent, easier to treat,  In addition, she reported 1 episode of sudden onset of dizziness, vertigo, nausea, double vision, followed by headache, lasting for 5 hours, this happened 2 to 3 weeks ago, now she is back to her baseline, had history of similar occurrence in the past,  Update  October 29, 2020: She is accompanied by her daughter, reported worsening headaches, worsening dizziness, lack of stamina, take frequent Excedrin Migraine, Botox as migraine prevention still works for her, but still the always 2-3 migraine each week,  UPDATE Sept 14 2022: She continues to decline slowly, spends most of the time sitting down, has frequent headaches, more than 20 days single month, despite Botox injection as preventive medication,  Continue have significant orthostatic hypotension,  REVIEW OF SYSTEMS: Full 14 system review of systems performed and notable only for above  All rest review of system were negative  ALLERGIES: No Known Allergies   HOME MEDICATIONS: Current Outpatient Medications  Medication Sig Dispense Refill   albuterol (VENTOLIN HFA) 108 (90 Base) MCG/ACT inhaler INHALE 2 PUFFS INTO THE LUNGS EVERY 6 HOURS AS NEEDED FOR WHEEZING OR SHORTNESS OF BREATH 18 g 11   aspirin-acetaminophen-caffeine (EXCEDRIN MIGRAINE) 250-250-65 MG tablet Take by mouth every 6 (six) hours as needed for headache.     botulinum toxin Type A (BOTOX) 100 units SOLR injection Inject IM every 3 months by MD in the office 2 vial 3   buPROPion (WELLBUTRIN XL) 150 MG 24 hr tablet Take 1 tablet (150 mg total) by mouth every morning. 30 tablet 5   Cholecalciferol (D3-1000 PO) Take 1 tablet by mouth daily.     fludrocortisone (FLORINEF) 0.1 MG tablet  TAKE 1 TABLET BY MOUTH THREE TIMES DAILY 270 tablet 4   gabapentin (NEURONTIN) 100 MG capsule TAKE 3 CAPSULES (300 MG TOTAL) BY MOUTH 3 (THREE) TIMES DAILY. 810 capsule 4   Meth-Hyo-M Bl-Na Phos-Ph Sal (URIBEL) 118 MG CAPS Take 1 capsule by mouth 4 times daily for 7 days. 28 capsule 1   midodrine (PROAMATINE) 10 MG tablet Take 1 tablet (10 mg total) by mouth 3 (three) times daily. 270 tablet 3   Multiple Vitamins-Minerals (PRESERVISION AREDS 2) CAPS Take 2 capsules by mouth.     nitrofurantoin, macrocrystal-monohydrate, (MACROBID) 100 MG capsule Take 1  capsule (100 mg total) by mouth 2 times daily for 7 days. 14 capsule 1   nortriptyline (PAMELOR) 10 MG capsule TAKE 2 CAPSULES (20 MG TOTAL) BY MOUTH AT BEDTIME. 60 capsule 11   ondansetron (ZOFRAN-ODT) 8 MG disintegrating tablet Take by mouth.     potassium chloride (KLOR-CON) 10 MEQ tablet TAKE 2 TABLETS BY MOUTH 2 TIMES DAILY 120 tablet 11   pravastatin (PRAVACHOL) 20 MG tablet Take 1 tablet (20 mg total) by mouth nightly. 30 tablet 5   Probiotic Product (PROBIOTIC PO) Take 1 capsule by mouth as directed.     propranolol ER (INDERAL LA) 60 MG 24 hr capsule TAKE 1 CAPSULE BY MOUTH EVERY NIGHT AT BEDTIME 90 capsule 3   Rimegepant Sulfate (NURTEC) 75 MG TBDP TAKE 1 TABLET BY MOUTH EVERY OTHER DAY AS DIRECTED 16 tablet 6   sertraline (ZOLOFT) 100 MG tablet Take 1 tablet (100 mg total) by mouth in the morning. 30 tablet 5   SUMAtriptan (IMITREX) 100 MG tablet TAKE 1 TABLET BY MOUTH AT ONSET OF MIGRAINE. MAY REPEAT IN 2 HRS, IF NEEDED. MAX DOSE: 2 TABS/DAY. 12 tablet 11   SYNTHROID 88 MCG tablet Take 1 tablet (88 mcg total) by mouth daily before breakfast. 45 tablet 3   vitamin B-12 (CYANOCOBALAMIN) 1000 MCG tablet Take 1,000 mcg by mouth daily.     zolpidem (AMBIEN) 10 MG tablet TAKE 1/2 - 1 TABLET BY MOUTH EVERY NIGHT AT BEDTIME AS NEEDED 30 tablet 5   No current facility-administered medications for this visit.    PAST MEDICAL HISTORY: Past Medical History:  Diagnosis Date   2019 novel coronavirus disease (COVID-19)    Anxiety    Confusion, hx of, without neuro findings    Depression    Hx of vertigo    Hyperlipidemia    Hypothyroid    Left shoulder pain    Migraines    Neck pain    Orthostatic hypotension    Renal insufficiency     PAST SURGICAL HISTORY: Past Surgical History:  Procedure Laterality Date   ABDOMINAL HYSTERECTOMY     ABDOMINAL HYSTERECTOMY     BREAST LUMPECTOMY     CHOLECYSTECTOMY     JOINT REPLACEMENT     THYROID SURGERY     TONSILLECTOMY      FAMILY  HISTORY: Family History  Problem Relation Age of Onset   Heart disease Mother        Angina   Cancer Mother    Cancer Father    Heart disease Father    Cancer Maternal Aunt     SOCIAL HISTORY:  Social History   Socioeconomic History   Marital status: Married    Spouse name: Not on file   Number of children: 2   Years of education: 16   Highest education level: Bachelor's degree (e.g., BA, AB, BS)  Occupational History  Employer: RETIRED  Tobacco Use   Smoking status: Never   Smokeless tobacco: Never  Vaping Use   Vaping Use: Never used  Substance and Sexual Activity   Alcohol use: No   Drug use: No   Sexual activity: Not on file  Other Topics Concern   Not on file  Social History Narrative   Lives at home alone.   Right-handed.   1-2 cups caffeine per day.   Social Determinants of Health   Financial Resource Strain: Not on file  Food Insecurity: Not on file  Transportation Needs: Not on file  Physical Activity: Not on file  Stress: Not on file  Social Connections: Not on file  Intimate Partner Violence: Not on file     PHYSICAL EXAM  There is no height or weight on file to calculate BMI. Blood pressure 119/85, heart rate of 74,   Gen: NAD, conversant, well nourised, obese, well groomed                       NEUROLOGICAL EXAM:  MENTAL STATUS: Speech/cognition: Awake alert oriented to history taking and casual conversation   CRANIAL NERVES: CN II: Visual fields are full to confrontation. Pupils are round equal and briskly reactive to light. CN III, IV, VI: extraocular movement are normal. No ptosis. CN V: Facial sensation is intact to pinprick in all 3 divisions bilaterally. Corneal responses are intact.  CN VII: Face is symmetric with normal eye closure and smile. CN VIII: Hearing is normal to rubbing fingers CN IX, X: Palate elevates symmetrically. Phonation is normal. CN XI: Head turning and shoulder shrug are intact  MOTOR: There is no  pronator drift of out-stretched arms. Muscle bulk and tone are normal. Muscle strength is normal.  REFLEXES: Reflexes are hypoactive and symmetric at the biceps, triceps, knees, and ankles. Plantar responses are flexor.  SENSORY: Mildly less dependent sensory changes to pinprick.  COORDINATION: No limb or truncal ataxia noted  GAIT/STANCE: She can get up without pushing on chair arm, steady, Romberg is absent.   DIAGNOSTIC DATA (LABS, IMAGING, TESTING) - I reviewed patient records, labs, notes, testing and imaging myself where available.   ASSESSMENT AND PLAN  Mallory Durham is a 80 y.o. female   Autonomic failure  With significant orthostatic blood pressure changes,  No treatable etiology found, most consistent with central nervous system degenerative disorder,  She also reported occasional pseudobulbar phenomena  Patient is severely symptomatic from her profound autonomic failure, significant orthostatic blood pressure changes, essentially disabled from her symptoms,   Midodrine 14m tid, dose should be before 3 PM to avoid supine hypertension, continue fludrocortisone 0.1 mg 3 times a day, emphasized importance of increased water intake,  New onset vertigo, double vision in February 2022  Personally reviewed MRI of the brain without contrast on July 17, 2020, mild small vessel disease no acute abnormality  Chronic migraine headaches  Imitrex 100 mg as needed  Botox injection for chronic migraine prevention, injection was performed according to [place] protocol,  5 units of Botox was injected into each side, for 31 injection sites, total of 155 units  Bilateral frontalis 4 injection sites Bilateral corrugate 2 injection sites Procerus 1 injection sites. Bilateral temporalis 8 injection sites Bilateral occipitalis 6 injection sites Bilateral cervical paraspinals 4 injection sites Bilateral upper trapezius 6 injection sites  Extra 45 unites were injected into bilateral  masseters, and the right temporoparietal region  High co-pay with Nurtec, will start on ajovy monthly  as preventive medication  Limit Imitrex use to less than twice each week  Marcial Pacas, M.D. Ph.D.  [facility] [address], [place], [place] [address] Ph: [phone] Fax: [phone]  CC: Fortino Sic, [place]

## 2021-01-29 ENCOUNTER — Other Ambulatory Visit (HOSPITAL_BASED_OUTPATIENT_CLINIC_OR_DEPARTMENT_OTHER): Payer: Self-pay

## 2021-01-29 DIAGNOSIS — G43709 Chronic migraine without aura, not intractable, without status migrainosus: Secondary | ICD-10-CM

## 2021-01-29 MED ORDER — ONABOTULINUMTOXINA 100 UNITS IJ SOLR
200.0000 [IU] | Freq: Once | INTRAMUSCULAR | Status: AC
  Administered 2021-01-29: 200 [IU] via INTRAMUSCULAR

## 2021-01-29 NOTE — Telephone Encounter (Signed)
Pt left vm, returning call. Please call back.

## 2021-01-29 NOTE — Telephone Encounter (Signed)
PA for Ajovy 225mg  started on covermymeds (key: [id number]). Pt has pharmacy coverage through [employer] [phone]). Decision pending.  I call the pharmacy and spoke to [facility]. Provided her with this update.

## 2021-01-29 NOTE — Telephone Encounter (Signed)
LVM for patient to return call.  According to patient's insurance, Emgality is on the preferred list for preventative with Bernita Raisin for acute.    Marcelino Duster has done a PA, however if patient is willing to try Emgality or has used and failed.

## 2021-01-29 NOTE — Telephone Encounter (Signed)
Reached patient by phone and she is fine with using Emgality and Ubrelvy.  She said even though the Nurtec has an approval from a PA, it is still to expensive.  I advised patient I would let Dr. Terrace Arabia know she wanted to try the different medications that are approved on her insurance.  Patient denied further questions, verbalized understanding and expressed appreciation for the phone call.

## 2021-01-30 ENCOUNTER — Other Ambulatory Visit (HOSPITAL_BASED_OUTPATIENT_CLINIC_OR_DEPARTMENT_OTHER): Payer: Self-pay

## 2021-01-30 MED ORDER — UBRELVY 50 MG PO TABS
ORAL_TABLET | ORAL | 11 refills | Status: DC
  Filled 2021-01-30: qty 12, 30d supply, fill #0

## 2021-01-30 MED ORDER — EMGALITY 120 MG/ML ~~LOC~~ SOAJ
120.0000 mg | SUBCUTANEOUS | 11 refills | Status: DC
  Filled 2021-01-30: qty 2, 30d supply, fill #0

## 2021-01-30 NOTE — Telephone Encounter (Signed)
Meds ordered this encounter  Medications   Galcanezumab-gnlm (EMGALITY) 120 MG/ML SOAJ    Sig: Inject 120 mg into the skin every 30 (thirty) days. Loading dose 120mg  two consecutive injections    Dispense:  1.12 mL    Refill:  11   Ubrogepant (UBRELVY) 50 MG TABS    Sig: Take 1 tab at onset of migraine.  May repeat in 2 hrs, if needed.  Max dose: 2 tabs/day. This is a 30 day prescription.    Dispense:  12 tablet    Refill:  11

## 2021-01-30 NOTE — Addendum Note (Signed)
Addended by: Levert Feinstein on: 01/30/2021 10:32 AM   Modules accepted: Orders

## 2021-01-30 NOTE — Telephone Encounter (Addendum)
PA for Ubrelvy 50mg  started on covermymeds (key: [id number]). Pt has pharmacy coverage through [employer] [phone]). Approved through 9/16/02023.  I called the pharmacy to void Ajovy and Nurtec refills. Emgality was covered by insurance. The pharmacy will notify her.   Once the costs have been determined, if either co-pay is too high they will have her call our office.   Ajovy PA voided with [employer].

## 2021-01-30 NOTE — Addendum Note (Signed)
Addended by: Lindell Spar C on: 01/30/2021 01:15 PM   Modules accepted: Orders

## 2021-02-06 ENCOUNTER — Other Ambulatory Visit (HOSPITAL_BASED_OUTPATIENT_CLINIC_OR_DEPARTMENT_OTHER): Payer: Self-pay

## 2021-02-06 MED FILL — Fludrocortisone Acetate Tab 0.1 MG: ORAL | 90 days supply | Qty: 270 | Fill #1 | Status: AC

## 2021-02-09 ENCOUNTER — Other Ambulatory Visit (HOSPITAL_BASED_OUTPATIENT_CLINIC_OR_DEPARTMENT_OTHER): Payer: Self-pay

## 2021-02-12 ENCOUNTER — Other Ambulatory Visit (HOSPITAL_BASED_OUTPATIENT_CLINIC_OR_DEPARTMENT_OTHER): Payer: Self-pay

## 2021-02-13 ENCOUNTER — Other Ambulatory Visit (HOSPITAL_BASED_OUTPATIENT_CLINIC_OR_DEPARTMENT_OTHER): Payer: Self-pay

## 2021-02-13 MED FILL — Potassium Chloride Microencapsulated Crys ER Tab 10 mEq: ORAL | 30 days supply | Qty: 120 | Fill #2 | Status: AC

## 2021-02-13 MED FILL — Potassium Chloride Microencapsulated Crys ER Tab 10 mEq: ORAL | 30 days supply | Qty: 120 | Fill #2 | Status: CN

## 2021-02-24 ENCOUNTER — Other Ambulatory Visit (HOSPITAL_COMMUNITY): Payer: Self-pay

## 2021-03-05 ENCOUNTER — Other Ambulatory Visit: Payer: Self-pay | Admitting: Neurology

## 2021-03-05 NOTE — Telephone Encounter (Signed)
Pt called requesting refill for zolpidem (AMBIEN) 10 MG tablet, however pt states her insurance is not covering it because if the dosage. Pt asking for a new prescription with the recommended dosage the insurance has provided. Pt requesting a call back.

## 2021-03-09 ENCOUNTER — Other Ambulatory Visit (HOSPITAL_BASED_OUTPATIENT_CLINIC_OR_DEPARTMENT_OTHER): Payer: Self-pay

## 2021-03-09 MED ORDER — ZOLPIDEM TARTRATE 10 MG PO TABS
10.0000 mg | ORAL_TABLET | Freq: Every evening | ORAL | 5 refills | Status: DC | PRN
  Filled 2021-03-09: qty 30, 30d supply, fill #0
  Filled 2021-04-06: qty 30, 30d supply, fill #1
  Filled 2021-05-01: qty 30, 30d supply, fill #2
  Filled 2021-05-26 – 2021-05-29 (×2): qty 30, 30d supply, fill #3
  Filled 2021-06-26: qty 30, 30d supply, fill #4
  Filled 2021-07-22 – 2021-07-27 (×2): qty 30, 30d supply, fill #5

## 2021-03-09 NOTE — Telephone Encounter (Signed)
I called patient. She would like the RX for ambien to be changed to [place] 10mg  1 tablet daily. She is taking a whole tablet each night and not a 1/2 tablet. The sig is confusing the pharmacy and insurance. Will send to Dr. for review. [place] Controlled Substance Registry checked and is appropriate.

## 2021-03-09 NOTE — Addendum Note (Signed)
Addended by: Geronimo Running A on: 03/09/2021 08:35 AM   Modules accepted: Orders

## 2021-03-11 ENCOUNTER — Other Ambulatory Visit (HOSPITAL_BASED_OUTPATIENT_CLINIC_OR_DEPARTMENT_OTHER): Payer: Self-pay

## 2021-03-17 ENCOUNTER — Other Ambulatory Visit (HOSPITAL_BASED_OUTPATIENT_CLINIC_OR_DEPARTMENT_OTHER): Payer: Self-pay

## 2021-03-20 ENCOUNTER — Other Ambulatory Visit (HOSPITAL_BASED_OUTPATIENT_CLINIC_OR_DEPARTMENT_OTHER): Payer: Self-pay

## 2021-03-23 ENCOUNTER — Other Ambulatory Visit (HOSPITAL_BASED_OUTPATIENT_CLINIC_OR_DEPARTMENT_OTHER): Payer: Self-pay

## 2021-03-31 ENCOUNTER — Other Ambulatory Visit (HOSPITAL_BASED_OUTPATIENT_CLINIC_OR_DEPARTMENT_OTHER): Payer: Self-pay

## 2021-04-06 ENCOUNTER — Other Ambulatory Visit (HOSPITAL_BASED_OUTPATIENT_CLINIC_OR_DEPARTMENT_OTHER): Payer: Self-pay

## 2021-04-06 MED FILL — Potassium Chloride Microencapsulated Crys ER Tab 10 mEq: ORAL | 30 days supply | Qty: 120 | Fill #3 | Status: AC

## 2021-04-07 ENCOUNTER — Other Ambulatory Visit (HOSPITAL_BASED_OUTPATIENT_CLINIC_OR_DEPARTMENT_OTHER): Payer: Self-pay

## 2021-04-16 ENCOUNTER — Other Ambulatory Visit (HOSPITAL_BASED_OUTPATIENT_CLINIC_OR_DEPARTMENT_OTHER): Payer: Self-pay

## 2021-04-16 ENCOUNTER — Other Ambulatory Visit: Payer: Self-pay | Admitting: Neurology

## 2021-04-22 ENCOUNTER — Ambulatory Visit (INDEPENDENT_AMBULATORY_CARE_PROVIDER_SITE_OTHER): Payer: PPO | Admitting: Neurology

## 2021-04-22 DIAGNOSIS — G43709 Chronic migraine without aura, not intractable, without status migrainosus: Secondary | ICD-10-CM

## 2021-04-23 ENCOUNTER — Other Ambulatory Visit (HOSPITAL_BASED_OUTPATIENT_CLINIC_OR_DEPARTMENT_OTHER): Payer: Self-pay

## 2021-04-23 NOTE — Progress Notes (Signed)
Botox 200 units x 2 vials [id number] [phone] Exp-06/2023  B/B

## 2021-04-24 MED ORDER — ONABOTULINUMTOXINA 100 UNITS IJ SOLR
200.0000 [IU] | Freq: Once | INTRAMUSCULAR | Status: AC
  Administered 2021-04-24: 200 [IU] via INTRAMUSCULAR

## 2021-04-24 NOTE — Progress Notes (Signed)
PATIENT: Mallory Durham DOB: 1941-01-02  Chief Complaint  Patient presents with   Procedure    botox     HISTORICAL  Mallory Durham is a 80 year old female, seen in refer by primary care PA Fortino Sic, for evaluation of migraine headaches, chronic neck pain, initial evaluation was on June 01, 2017.  Reviewed and summarized the referring note, she has past medical history of hypothyroidism, on supplement, hyperlipidemia, chronic neck pain, chronic renal insufficiency,  Since 2016, she noticed progressive worsening dizziness, to the point now, she sits down most of the time, is not able to exercise, in addition, she is under a lot of stress, tried to see her husband at the nursing home on a daily basis.  She drinks 2 of 12 ounce of water bottle every day, but she complains lightheadedness shortly after standing up, mild improvement walking short distance, but with prolonged walking and standing, she would develop this washout sensation, lightheadedness, as if she is going to faint, she has to sit down to improve her symptoms.  During today's examination, she was found to have significant orthostatic blood pressure change in the setting of already low blood pressure, lying down blood pressure 97/70, heart rate of 83, standing up systolic blood pressure was 50 or less, required multiple measurement to register, she does become symptomatic, especially after standing up for more than 3 minutes.  Her symptoms gradually improved after lying flat.  Previously she was given prescription of midodrine 2.5 mg to get in the early morning, and before bed, there was no significant improvement noted.  She also noted gradual onset memory loss, difficulty focusing, she has history of migraine headache, increased migraine over the past few months, she could not tolerate Imitrex 100 mg as needed due to heart palpitation, she usually take half of 100 mg tablets, if needed, she may repeat in couple  hours, which take away the headache in few hours,  She is quite disabled by her dizziness, has to stay seated most of the time, she denies bilateral lower extremity swelling, no significant sensory loss.  UPDATE July 18 2017: MRI of the brain in February 2019, generalized atrophy, especially at the right perisylvian fissure area, mild supratentorium small vessel disease, there is no acute abnormality.  Laboratory evaluation showed A1c of 5.2, normal negative ANA, copper, C-reactive protein, ESR, RPR, B12, CMP, lipid profile, BNP was elevated 246   Significant low vitamin D 5.2,  Echocardiogram showed ejection fraction of 55-60%, wall motion was normal, aortic valve showed no stenosis, ventricular size was normal, systolic function was normal  She is taking gabapentin 629m bid, midodrine 10 mg 3 times a day, last dose was before she goes to bed, she also complains of chronic insomnia, taking Ambien 10 mg at nighttime  She still has depression, anxiety, she has frequent headaches, once or twice each day, she has been 50 mg as needed, 50 mg or Maxalt as needed for headaches, which helps her headache some  UPDATE November 01 2017: She is accompanied by her daughter Mallory Durham today's clinical visit, functional status has much declined, despite midodrine 10 mg 3 times a day, and the Mestinon 60 mg 3 times a day, she complains of lightheadedness, dizziness when getting up just a few minutes, today she had significant orthostatic pressure dropped [id number] systolic 60 after standing up, difficult to register diastolic pressure.   She complains lightheadedness fainting sensation after lying down for a few minutes,blood pressure points back to  120/80, her symptoms has much improved  She also complains of frequent migraine headaches, sometimes woke up with migraine headaches, taking Imitrex as needed  Chronic insomnia, Ambien 5 to 10 mg as needed  UPDATE Sept 9 2019: She is now taking  fludrocortisone 0.1 mg 3 times a day, which has really helped her symptoms, along with Mestinon 60 mg daily, she is scheduled to see [facility] on February 07, 2018,  She complains of chronic migraine headaches for many years,3-4 times each week, right lateralized severe pounding headaches, with associated light noise sensitivity, Imitrex 100 mg as needed was helpful, still her headache last about 6 hours,  UPDATE May 02 2018: She is now taking fludrocortisone 0.3 mg 3 times a day, Mestinon 60 mg 3 times a day, midodrine was increased from 2.5 to 5 mg 3 times a day, orthostatic symptoms has much improved,  She has almost daily headache, more on right temporal region, imitrex 161m 1/2 tab daily,   Reported 40% improvement with Botox injection, she has less headache, less severe, but benefit short lasting,  UPDATE Apr 02 2020: She suffered COVID-19 in early October 2021, recovering well, still complains of significant fatigue, orthostatic dizziness, fainting spells,  Reviewed evaluation by cardiologist Dr. SVirl Axeon December 25, 2019, agree with current medication management, no longer take Mestinon, f still on fludrocortisone 0.1 mg 3 times a day, midodrine 10 mg 2-3 times a day,  Skin itching is well managed by gabapentin 300 mg 3 times a day, only occasionally Benadryl,  Migraine headache is overall under good control, Botox has really helped,  UPDATE Jul 02 2020: She complains of worsening orthostatic hypotension, dizziness, taking midodrine 10 mg twice a day, today sitting blood pressure 130/80, standing up 90/60  She continue have frequent headaches but improved with Botox injection, it is less frequent, easier to treat,  In addition, she reported 1 episode of sudden onset of dizziness, vertigo, nausea, double vision, followed by headache, lasting for 5 hours, this happened 2 to 3 weeks ago, now she is back to her baseline, had history of similar occurrence in the  past,  Update October 29, 2020: She is accompanied by her daughter, reported worsening headaches, worsening dizziness, lack of stamina, take frequent Excedrin Migraine, Botox as migraine prevention still works for her, but still the always 2-3 migraine each week,  UPDATE Sept 14 2022: She continues to decline slowly, spends most of the time sitting down, has frequent headaches, more than 20 days single month, despite Botox injection as preventive medication,  Continue have significant orthostatic hypotension,  Update April 22, 2021 She is accompanied by daughter, came in wheelchair today, she continues to have intermittent headache especially at the end of 3 months from previous Botox injection, 1-2 headaches a week, frequent dizziness result of static blood pressure change  REVIEW OF SYSTEMS: Full 14 system review of systems performed and notable only for above  All rest review of system were negative  ALLERGIES: No Known Allergies     DIAGNOSTIC DATA (LABS, IMAGING, TESTING) - I reviewed patient records, labs, notes, testing and imaging myself where available.   ASSESSMENT AND PLAN  LShaima Sardinasis a 80y.o. female   Autonomic failure  With significant orthostatic blood pressure changes,  No treatable etiology found, most consistent with central nervous system degenerative disorder,  She also reported occasional pseudobulbar phenomena  Patient is severely symptomatic from her profound autonomic failure, significant orthostatic blood pressure changes, essentially disabled  from her symptoms,   Midodrine 38m tid, dose should be before 3 PM to avoid supine hypertension, continue fludrocortisone 0.1 mg 3 times a day, emphasized importance of increased water intake,  New onset vertigo, double vision in February 2022  Personally reviewed MRI of the brain without contrast on July 17, 2020, mild small vessel disease no acute abnormality  Chronic migraine headaches  Imitrex 100 mg as  needed  Botox injection for chronic migraine prevention, injection was performed according to [place] protocol,  5 units of Botox was injected into each side, for 31 injection sites, total of 155 units  Bilateral frontalis 4 injection sites  Bilateral temporalis 8 injection sites Bilateral occipitalis 6 injection sites Bilateral cervical paraspinals 4 injection sites Bilateral upper trapezius 6 injection sites  Extra 60 unites were injected into bilateral masseters, and the right temporoparietal region and bilateral levator scapular  High co-pay with Nurtec, will start on ajovy monthly as preventive medication  Limit Imitrex use to less than twice each week  YMarcial Pacas M.D. Ph.D.  [facility] [address] [place] [id number]: [phone]: [phone] CC: WFortino Sic PUtah

## 2021-04-27 ENCOUNTER — Other Ambulatory Visit (HOSPITAL_BASED_OUTPATIENT_CLINIC_OR_DEPARTMENT_OTHER): Payer: Self-pay

## 2021-04-30 ENCOUNTER — Other Ambulatory Visit: Payer: Self-pay | Admitting: Internal Medicine

## 2021-04-30 ENCOUNTER — Other Ambulatory Visit (HOSPITAL_BASED_OUTPATIENT_CLINIC_OR_DEPARTMENT_OTHER): Payer: Self-pay

## 2021-05-01 ENCOUNTER — Other Ambulatory Visit (HOSPITAL_BASED_OUTPATIENT_CLINIC_OR_DEPARTMENT_OTHER): Payer: Self-pay

## 2021-05-01 ENCOUNTER — Telehealth: Payer: Self-pay | Admitting: Internal Medicine

## 2021-05-01 DIAGNOSIS — E890 Postprocedural hypothyroidism: Secondary | ICD-10-CM

## 2021-05-01 MED ORDER — SYNTHROID 88 MCG PO TABS
88.0000 ug | ORAL_TABLET | Freq: Every day | ORAL | 0 refills | Status: DC
Start: 2021-05-01 — End: 2021-06-03
  Filled 2021-05-01: qty 30, 30d supply, fill #0

## 2021-05-01 NOTE — Telephone Encounter (Signed)
MEDICATION: SYNTHROID 88 MCG tablet  PHARMACY:   [facility] Phone:  [phone]  Fax:  [phone]      HAS THE PATIENT CONTACTED THEIR PHARMACY?  yes  IS THIS A 90 DAY SUPPLY : 30 day  IS PATIENT OUT OF MEDICATION: no  IF NOT; HOW MUCH IS LEFT: 1 week  LAST APPOINTMENT DATE: @12 /15/2022  NEXT APPOINTMENT DATE:@1 /07/2021  DO WE HAVE YOUR PERMISSION TO LEAVE A DETAILED MESSAGE?:yes  OTHER COMMENTS:    **Let patient know to contact pharmacy at the end of the day to make sure medication is ready. **  ** Please notify patient to allow 48-72 hours to process**  **Encourage patient to contact the pharmacy for refills or they can request refills through [facility]**

## 2021-05-01 NOTE — Telephone Encounter (Signed)
Rx sent to preferred pharmacy.

## 2021-05-07 ENCOUNTER — Other Ambulatory Visit: Payer: PPO

## 2021-05-07 ENCOUNTER — Other Ambulatory Visit: Payer: Self-pay

## 2021-05-07 ENCOUNTER — Other Ambulatory Visit (HOSPITAL_COMMUNITY): Payer: Self-pay

## 2021-05-07 MED ORDER — CARESTART COVID-19 HOME TEST VI KIT
PACK | 0 refills | Status: DC
  Filled 2021-05-07: qty 4, 4d supply, fill #0

## 2021-05-08 ENCOUNTER — Other Ambulatory Visit (HOSPITAL_BASED_OUTPATIENT_CLINIC_OR_DEPARTMENT_OTHER): Payer: Self-pay

## 2021-05-14 ENCOUNTER — Other Ambulatory Visit (HOSPITAL_COMMUNITY): Payer: Self-pay

## 2021-05-18 ENCOUNTER — Ambulatory Visit
Admission: EM | Admit: 2021-05-18 | Discharge: 2021-05-18 | Disposition: A | Discharge status: Home / Self Care | Payer: PPO | Attending: Emergency Medicine | Admitting: Emergency Medicine

## 2021-05-18 ENCOUNTER — Ambulatory Visit (HOSPITAL_BASED_OUTPATIENT_CLINIC_OR_DEPARTMENT_OTHER)
Admit: 2021-05-18 | Discharge: 2021-05-18 | Disposition: A | Discharge status: Home / Self Care | Payer: PPO | Attending: Family Medicine | Admitting: Family Medicine

## 2021-05-18 ENCOUNTER — Other Ambulatory Visit: Payer: Self-pay

## 2021-05-18 ENCOUNTER — Other Ambulatory Visit (HOSPITAL_COMMUNITY): Payer: Self-pay

## 2021-05-18 ENCOUNTER — Other Ambulatory Visit (HOSPITAL_BASED_OUTPATIENT_CLINIC_OR_DEPARTMENT_OTHER): Payer: Self-pay

## 2021-05-18 DIAGNOSIS — R0989 Other specified symptoms and signs involving the circulatory and respiratory systems: Secondary | ICD-10-CM

## 2021-05-18 DIAGNOSIS — R062 Wheezing: Secondary | ICD-10-CM | POA: Diagnosis not present

## 2021-05-18 DIAGNOSIS — J22 Unspecified acute lower respiratory infection: Secondary | ICD-10-CM | POA: Diagnosis present

## 2021-05-18 MED ORDER — PROMETHAZINE-DM 6.25-15 MG/5ML PO SYRP
5.0000 mL | ORAL_SOLUTION | Freq: Four times a day (QID) | ORAL | 0 refills | Status: DC | PRN
  Filled 2021-05-18: qty 180, 9d supply, fill #0

## 2021-05-18 MED ORDER — METHYLPREDNISOLONE 4 MG PO TBPK
ORAL_TABLET | ORAL | 0 refills | Status: DC
  Filled 2021-05-18: qty 21, 6d supply, fill #0

## 2021-05-18 MED ORDER — AZITHROMYCIN 250 MG PO TABS
ORAL_TABLET | ORAL | 0 refills | Status: AC
Start: 2021-05-18 — End: 2021-05-23
  Filled 2021-05-18: qty 6, 5d supply, fill #0

## 2021-05-18 MED ORDER — FLUTICASONE FUROATE-VILANTEROL 200-25 MCG/ACT IN AEPB
1.0000 | INHALATION_SPRAY | Freq: Every day | RESPIRATORY_TRACT | 2 refills | Status: AC
  Filled 2021-05-18: qty 60, 30d supply, fill #0

## 2021-05-18 MED ORDER — AMOXICILLIN-POT CLAVULANATE 875-125 MG PO TABS
1.0000 | ORAL_TABLET | Freq: Two times a day (BID) | ORAL | 0 refills | Status: AC
  Filled 2021-05-18: qty 14, 7d supply, fill #0

## 2021-05-18 MED ORDER — GUAIFENESIN 400 MG PO TABS
ORAL_TABLET | ORAL | 0 refills | Status: DC
Start: 2021-05-18 — End: 2021-09-21
  Filled 2021-05-18: qty 60, 20d supply, fill #0

## 2021-05-18 MED ORDER — FLUTICASONE PROPIONATE 50 MCG/ACT NA SUSP
2.0000 | Freq: Every day | NASAL | 0 refills | Status: DC
  Filled 2021-05-18: qty 16, 30d supply, fill #0

## 2021-05-18 MED ORDER — AEROCHAMBER PLUS FLO-VU LARGE MISC
1.0000 | Freq: Once | 0 refills | Status: AC
Start: 2021-05-18 — End: 2021-05-19
  Filled 2021-05-18: qty 1, 1d supply, fill #0

## 2021-05-18 NOTE — ED Provider Notes (Signed)
[facility]    CSN: [id number] Arrival date & time: 05/18/21  1236    HISTORY   Chief Complaint  Patient presents with   Cough   Nasal Congestion   HPI Mallory Durham is a 81 y.o. female.  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   Gait abnormality 04/17/2019   Itching 04/17/2019   Vitamin D deficiency 09/27/2018   Fatigue 09/27/2018   Chronic insomnia 11/01/2017   Peripheral polyneuropathy 08/05/2017   Memory loss 07/19/2017   Autonomic failure 06/01/2017   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   Dizziness 04/26/2012   Encounter for therapeutic drug monitoring 04/18/2012   Unspecified psychosis 04/18/2012   Osteoarthrosis, unspecified whether generalized or localized, shoulder region 04/18/2012   Pain in limb 04/18/2012   Cervical spondylosis without myelopathy 04/18/2012   Orthostatic hypotension 03/01/2012   Hyperlipidemia 03/01/2012   RHINOSINUSITIS, 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    Home Medications    Prior to Admission medications   Medication Sig Start Date End Date Taking? Authorizing Provider  albuterol (VENTOLIN HFA)  108 (90 Base) MCG/ACT inhaler INHALE 2 PUFFS INTO THE LUNGS EVERY 6 HOURS AS NEEDED FOR WHEEZING OR SHORTNESS OF BREATH 05/20/20 05/20/21  Jonathon Resides, MD  aspirin-acetaminophen-caffeine (EXCEDRIN MIGRAINE) [phone] MG tablet Take by mouth every 6 (six) hours as needed for headache.    [provider]  botulinum toxin Type A (BOTOX) 100 units SOLR injection Inject IM every 3 months by MD in the office 12/07/17   Marcial Pacas, MD  buPROPion (WELLBUTRIN XL) 150 MG 24 hr tablet Take 1 tablet (150 mg total) by mouth every morning. 12/08/20     Cholecalciferol (D3-1000 PO) Take 1 tablet by mouth daily.    [provider]  COVID-19 At Home Antigen Test [facility] COVID-19 HOME TEST) KIT Use as directed 05/07/21   Edmon Crape, RPH  gabapentin (NEURONTIN) 100 MG capsule TAKE 3 CAPSULES (300 MG TOTAL) BY MOUTH 3 (THREE) TIMES DAILY. 11/19/20 11/19/21  Marcial Pacas, MD  Galcanezumab-gnlm (EMGALITY) 120 MG/ML SOAJ Inject 120 mg into the skin every 30 (thirty) days. Loading dose 152m two consecutive injections 01/30/21   YMarcial Pacas MD  Meth-Hyo-M Bl-Na Phos-Ph Sal (URIBEL) 118 MG CAPS Take 1 capsule by mouth 4 times daily for 7 days. 01/01/21     midodrine (PROAMATINE) 10 MG tablet Take 1 tablet (10 mg total) by mouth 3 (three) times daily. 01/16/21   YMarcial Pacas MD  Multiple Vitamins-Minerals (PRESERVISION AREDS 2) CAPS  Take 2 capsules by mouth.    [provider]  nitrofurantoin, macrocrystal-monohydrate, (MACROBID) 100 MG capsule Take 1 capsule (100 mg total) by mouth 2 times daily for 7 days. 01/01/21     nortriptyline (PAMELOR) 10 MG capsule TAKE 2 CAPSULES (20 MG TOTAL) BY MOUTH AT BEDTIME. 09/29/20 09/29/21  Marcial Pacas, MD  ondansetron (ZOFRAN-ODT) 8 MG disintegrating tablet Take by mouth. 02/18/20   [provider]  potassium chloride (KLOR-CON) 10 MEQ tablet TAKE 2 TABLETS BY MOUTH 2 TIMES DAILY 05/20/20 05/20/21  Zanard, Bernadene Bell, MD  pravastatin (PRAVACHOL) 20 MG tablet Take 1  tablet (20 mg total) by mouth nightly. 12/20/20     Probiotic Product (PROBIOTIC PO) Take 1 capsule by mouth as directed.    [provider]  propranolol ER (INDERAL LA) 60 MG 24 hr capsule TAKE 1 CAPSULE BY MOUTH EVERY NIGHT AT BEDTIME 01/16/21 01/16/22  Marcial Pacas, MD  sertraline (ZOLOFT) 100 MG tablet Take 1 tablet (100 mg total) by mouth in the morning. 12/08/20     SUMAtriptan (IMITREX) 100 MG tablet TAKE 1 TABLET BY MOUTH AT ONSET OF MIGRAINE. MAY REPEAT IN 2 HRS, IF NEEDED. MAX DOSE: 2 TABS/DAY. 08/19/20 08/19/21  Marcial Pacas, MD  SYNTHROID 88 MCG tablet Take 1 tablet (88 mcg total) by mouth daily before breakfast. 05/01/21   Philemon Kingdom, MD  Ubrogepant (UBRELVY) 50 MG TABS Take 1 tablet by mouth at onset of migraine.  May repeat in 2 hrs, if needed.  Max dose: 2 tabs/day. This is a 30 day prescription. 01/30/21   Marcial Pacas, MD  vitamin B-12 (CYANOCOBALAMIN) 1000 MCG tablet Take 1,000 mcg by mouth daily.    [provider]  zolpidem (AMBIEN) 10 MG tablet Take 1 tablet (10 mg total) by mouth at bedtime as needed for sleep. 03/09/21 09/05/21  Marcial Pacas, MD   Family History Family History  Problem Relation Age of Onset   Heart disease Mother        Angina   Cancer Mother    Cancer Father    Heart disease Father    Cancer Maternal Aunt    Social History Social History   Tobacco Use   Smoking status: Never   Smokeless tobacco: Never  Vaping Use   Vaping Use: Never used  Substance Use Topics   Alcohol use: No   Drug use: No   Allergies   Patient has no known allergies.  Review of Systems Review of Systems  Respiratory:  Positive for cough.   Pertinent findings noted in history of present illness.   Physical Exam Triage Vital Signs ED Triage Vitals  Enc Vitals Group     BP 03/13/21 0827 (!) 147/82     Pulse Rate 03/13/21 0827 72     Resp 03/13/21 0827 18     Temp 03/13/21 0827 98.3 F (36.8 C)     Temp Source 03/13/21 0827 Oral     SpO2 03/13/21 0827 98 %      Weight --      Height --      Head Circumference --      Peak Flow --      Pain Score 03/13/21 0826 5     Pain Loc --      Pain Edu? --      Excl. in [place]? --   No data found.  Updated Vital Signs BP 138/86 (BP Location: Right Arm)    Pulse 78    Temp 98.7  F (37.1 C) (Oral)    Resp 16    SpO2 95%   Physical Exam Vitals and nursing note reviewed.  Constitutional:      General: She is not in acute distress.    Appearance: Normal appearance. She is not ill-appearing.  HENT:     Head: Normocephalic and atraumatic.     Salivary Glands: Right salivary gland is not diffusely enlarged or tender. Left salivary gland is not diffusely enlarged or tender.     Right Ear: Tympanic membrane, ear canal and external ear normal. No drainage. No middle ear effusion. There is no impacted cerumen. Tympanic membrane is not erythematous or bulging.     Left Ear: Tympanic membrane, ear canal and external ear normal. No drainage.  No middle ear effusion. There is no impacted cerumen. Tympanic membrane is not erythematous or bulging.     Nose: Nose normal. No nasal deformity, septal deviation, mucosal edema, congestion or rhinorrhea.     Right Turbinates: Not enlarged, swollen or pale.     Left Turbinates: Not enlarged, swollen or pale.     Right Sinus: No maxillary sinus tenderness or frontal sinus tenderness.     Left Sinus: No maxillary sinus tenderness or frontal sinus tenderness.     Mouth/Throat:     Lips: Pink. No lesions.     Mouth: Mucous membranes are moist. No oral lesions.     Pharynx: Oropharynx is clear. Uvula midline. No posterior oropharyngeal erythema or uvula swelling.     Tonsils: No tonsillar exudate. 0 on the right. 0 on the left.  Eyes:     General: Lids are normal.        Right eye: No discharge.        Left eye: No discharge.     Extraocular Movements: Extraocular movements intact.     Conjunctiva/sclera: Conjunctivae normal.     Right eye: Right conjunctiva is not injected.      Left eye: Left conjunctiva is not injected.  Neck:     Trachea: Trachea and phonation normal.  Cardiovascular:     Rate and Rhythm: Normal rate and regular rhythm.     Pulses: Normal pulses.     Heart sounds: Normal heart sounds. No murmur heard.   No friction rub. No gallop.  Pulmonary:     Effort: Pulmonary effort is normal. No accessory muscle usage, prolonged expiration or respiratory distress.     Breath sounds: No stridor, decreased air movement or transmitted upper airway sounds. Examination of the right-upper field reveals wheezing. Examination of the left-upper field reveals wheezing. Examination of the right-middle field reveals wheezing and rales. Examination of the left-middle field reveals rales. Examination of the right-lower field reveals rhonchi and rales. Examination of the left-lower field reveals rales. Wheezing, rhonchi and rales present. No decreased breath sounds.  Chest:     Chest wall: No tenderness.  Musculoskeletal:        General: Normal range of motion.     Cervical back: Normal range of motion and neck supple. Normal range of motion.  Lymphadenopathy:     Cervical: No cervical adenopathy.  Skin:    General: Skin is warm and dry.     Findings: No erythema or rash.  Neurological:     General: No focal deficit present.     Mental Status: She is alert and oriented to person, place, and time.  Psychiatric:        Mood and Affect: Mood normal.  Behavior: Behavior normal.    Visual Acuity Right Eye Distance:   Left Eye Distance:   Bilateral Distance:    Right Eye Near:   Left Eye Near:    Bilateral Near:     UC Couse / Diagnostics / Procedures:    EKG  Radiology No results found.  Procedures Procedures (including critical care time)  UC Diagnoses / Final Clinical Impressions(s)   I have reviewed the triage vital signs and the nursing notes.  Pertinent labs & imaging results that were available during my care of the patient were  reviewed by me and considered in my medical decision making (see chart for details).   Final diagnoses:  Lower respiratory infection  Wheezing  Bilateral rales  Rhonchi at right lung base   Physical exam findings concerning for pneumonia.  Patient also has intermittent wheezing, rhonchi and rales on exam.  Recommend giving dual antibiotic therapy along with a Medrol Dosepak, Breo and albuterol.  Patient provided with cough medicine to be taken at night only.  ED Prescriptions     Medication Sig Dispense Auth. Provider   fluticasone furoate-vilanterol (BREO ELLIPTA) 200-25 MCG/ACT AEPB Inhale 1 puff into the lungs daily. 30 each Lynden Oxford Scales, PA-C   promethazine-dextromethorphan (PROMETHAZINE-DM) 6.25-15 MG/5ML syrup Take 5 mLs by mouth 4 (four) times daily as needed for cough. 180 mL Lynden Oxford Scales, PA-C   guaifenesin (HUMIBID E) 400 MG TABS tablet Take 1 tablet 3 times daily as needed for chest congestion and cough 21 tablet Lynden Oxford Scales, PA-C   methylPREDNISolone (MEDROL DOSEPAK) 4 MG TBPK tablet Take 24 mg on day 1, 20 mg on day 2, 16 mg on day 3, 12 mg on day 4, 8 mg on day 5, 4 mg on day 6. 21 tablet Lynden Oxford Scales, PA-C   amoxicillin-clavulanate (AUGMENTIN) 875-125 MG tablet Take 1 tablet by mouth 2 (two) times daily for 7 days. 14 tablet Lynden Oxford Scales, PA-C   azithromycin (ZITHROMAX) 250 MG tablet Take 2 tablets (500 mg total) by mouth daily for 1 day, THEN 1 tablet (250 mg total) daily for 4 days. 6 tablet Lynden Oxford Scales, PA-C   fluticasone (FLONASE) 50 MCG/ACT nasal spray Place 2 sprays into both nostrils daily. 18 mL Lynden Oxford Scales, PA-C   Spacer/Aero-Holding Chambers (AEROCHAMBER PLUS FLO-VU LARGE) MISC 1 each by Other route once for 1 dose. 1 each Lynden Oxford Scales, PA-C      PDMP not reviewed this encounter.  Pending results:  Labs Reviewed - No data to display  Medications Ordered in UC: Medications - No data  to display  Disposition Upon Discharge:  Condition: stable for discharge home Home: take medications as prescribed; routine discharge instructions as discussed; follow up as advised.  Patient presented with an acute illness with associated systemic symptoms and significant discomfort requiring urgent management. In my opinion, this is a condition that a prudent lay person (someone who possesses an average knowledge of health and medicine) may potentially expect to result in complications if not addressed urgently such as respiratory distress, impairment of bodily function or dysfunction of bodily organs.   Routine symptom specific, illness specific and/or disease specific instructions were discussed with the patient and/or caregiver at length.   As such, the patient has been evaluated and assessed, work-up was performed and treatment was provided in alignment with urgent care protocols and evidence based medicine.  Patient/parent/caregiver has been advised that the patient may require follow up for further testing and  treatment if the symptoms continue in spite of treatment, as clinically indicated and appropriate.  If the patient was tested for COVID-19, Influenza and/or RSV, then the patient/parent/guardian was advised to isolate at home pending the results of his/her diagnostic coronavirus test and potentially longer if theyre positive. I have also advised pt that if his/her COVID-19 test returns positive, it's recommended to self-isolate for at least 10 days after symptoms first appeared AND until fever-free for 24 hours without fever reducer AND other symptoms have improved or resolved. Discussed self-isolation recommendations as well as instructions for household member/close contacts as per the [facility] and [place] DHHS, and also gave patient the [place] packet with this information.  Patient/parent/caregiver has been advised to return to the [facility] or PCP in 3-5 days if no better; to PCP or the Emergency  Department if new signs and symptoms develop, or if the current signs or symptoms continue to change or worsen for further workup, evaluation and treatment as clinically indicated and appropriate  The patient will follow up with their current PCP if and as advised. If the patient does not currently have a PCP we will assist them in obtaining one.   The patient may need specialty follow up if the symptoms continue, in spite of conservative treatment and management, for further workup, evaluation, consultation and treatment as clinically indicated and appropriate.  Patient/parent/caregiver verbalized understanding and agreement of plan as discussed.  All questions were addressed during visit.  Please see discharge instructions below for further details of plan.  Discharge Instructions:   Discharge Instructions      Please report to the [place] location for chest x-ray now.  Please begin Augmentin and azithromycin as prescribed for presumed pneumonia.    I also recommend that you begin using Breo, 1 puff daily and albuterol, 2 puffs 4 times daily until cough and work of breathing has significantly improved.  Please feel free to continue Breo once your pneumonia has resolved.  Please begin Medrol Dosepak to reduce inflammation in lungs, open airways and make coughing more efficient.  I recommend that you take Mucinex to help with hydration of infection lungs.  I provided you with a cough syrup to take at night to help you sleep.  Please follow-up in the next 3 to 5 days for repeat evaluation of your lungs if you have had not had meaningful improvement of your symptoms.      This office note has been dictated using Museum/gallery curator.  Unfortunately, and despite my best efforts, this method of dictation can sometimes lead to occasional typographical or grammatical errors.  I apologize in advance if this occurs.     Lynden Oxford Scales, [place] 05/18/21  [phone]

## 2021-05-18 NOTE — Discharge Instructions (Signed)
Please report to the [facility] location for chest x-ray now.  Please begin Augmentin and azithromycin as prescribed for presumed pneumonia.    I also recommend that you begin using Breo, 1 puff daily and albuterol, 2 puffs 4 times daily until cough and work of breathing has significantly improved.  Please feel free to continue Breo once your pneumonia has resolved.  Please begin Medrol Dosepak to reduce inflammation in lungs, open airways and make coughing more efficient.  I recommend that you take Mucinex to help with hydration of infection lungs.  I provided you with a cough syrup to take at night to help you sleep.  Please follow-up in the next 3 to 5 days for repeat evaluation of your lungs if you have had not had meaningful improvement of your symptoms.

## 2021-05-19 ENCOUNTER — Other Ambulatory Visit: Payer: PPO

## 2021-05-26 ENCOUNTER — Other Ambulatory Visit (HOSPITAL_BASED_OUTPATIENT_CLINIC_OR_DEPARTMENT_OTHER): Payer: Self-pay

## 2021-05-28 ENCOUNTER — Other Ambulatory Visit (HOSPITAL_BASED_OUTPATIENT_CLINIC_OR_DEPARTMENT_OTHER): Payer: Self-pay

## 2021-05-28 ENCOUNTER — Other Ambulatory Visit: Payer: PPO

## 2021-05-29 ENCOUNTER — Other Ambulatory Visit (HOSPITAL_BASED_OUTPATIENT_CLINIC_OR_DEPARTMENT_OTHER): Payer: Self-pay

## 2021-06-01 ENCOUNTER — Other Ambulatory Visit (HOSPITAL_BASED_OUTPATIENT_CLINIC_OR_DEPARTMENT_OTHER): Payer: Self-pay

## 2021-06-01 MED ORDER — AMOXICILLIN 500 MG PO CAPS
ORAL_CAPSULE | ORAL | 0 refills | Status: DC
  Filled 2021-06-01: qty 12, 4d supply, fill #0

## 2021-06-01 MED ORDER — SODIUM FLUORIDE 1.1 % DT CREA
TOPICAL_CREAM | DENTAL | 0 refills | Status: DC
  Filled 2021-06-01: qty 51, 30d supply, fill #0

## 2021-06-03 ENCOUNTER — Other Ambulatory Visit: Payer: Self-pay | Admitting: Internal Medicine

## 2021-06-03 ENCOUNTER — Other Ambulatory Visit (HOSPITAL_BASED_OUTPATIENT_CLINIC_OR_DEPARTMENT_OTHER): Payer: Self-pay

## 2021-06-03 ENCOUNTER — Other Ambulatory Visit: Payer: Self-pay | Admitting: Neurology

## 2021-06-03 DIAGNOSIS — E890 Postprocedural hypothyroidism: Secondary | ICD-10-CM

## 2021-06-04 ENCOUNTER — Other Ambulatory Visit (HOSPITAL_BASED_OUTPATIENT_CLINIC_OR_DEPARTMENT_OTHER): Payer: Self-pay

## 2021-06-04 ENCOUNTER — Other Ambulatory Visit (INDEPENDENT_AMBULATORY_CARE_PROVIDER_SITE_OTHER): Payer: PPO

## 2021-06-04 DIAGNOSIS — E890 Postprocedural hypothyroidism: Secondary | ICD-10-CM

## 2021-06-04 MED ORDER — SYNTHROID 88 MCG PO TABS
88.0000 ug | ORAL_TABLET | Freq: Every day | ORAL | 0 refills | Status: DC
  Filled 2021-06-04: qty 30, 30d supply, fill #0

## 2021-06-04 MED ORDER — TIZANIDINE HCL 2 MG PO TABS
ORAL_TABLET | Freq: Three times a day (TID) | ORAL | 1 refills | Status: DC | PRN
  Filled 2021-06-04: qty 30, 10d supply, fill #0

## 2021-06-04 MED ORDER — FLUAD QUADRIVALENT 0.5 ML IM PRSY
PREFILLED_SYRINGE | INTRAMUSCULAR | 0 refills | Status: DC
  Filled 2021-06-04: qty 0.5, 1d supply, fill #0

## 2021-06-05 LAB — TSH: TSH: 2.68 u[IU]/mL (ref 0.35–5.50)

## 2021-06-05 LAB — T4, FREE: Free T4: 1.41 ng/dL (ref 0.60–1.60)

## 2021-06-09 ENCOUNTER — Other Ambulatory Visit (HOSPITAL_BASED_OUTPATIENT_CLINIC_OR_DEPARTMENT_OTHER): Payer: Self-pay

## 2021-06-09 ENCOUNTER — Other Ambulatory Visit: Payer: Self-pay | Admitting: Neurology

## 2021-06-11 ENCOUNTER — Other Ambulatory Visit: Payer: Self-pay | Admitting: Family Medicine

## 2021-06-11 DIAGNOSIS — K769 Liver disease, unspecified: Secondary | ICD-10-CM

## 2021-06-12 ENCOUNTER — Other Ambulatory Visit: Payer: Self-pay

## 2021-06-12 ENCOUNTER — Ambulatory Visit: Payer: PPO

## 2021-06-12 ENCOUNTER — Other Ambulatory Visit (HOSPITAL_BASED_OUTPATIENT_CLINIC_OR_DEPARTMENT_OTHER): Payer: Self-pay

## 2021-06-15 ENCOUNTER — Other Ambulatory Visit (HOSPITAL_BASED_OUTPATIENT_CLINIC_OR_DEPARTMENT_OTHER): Payer: Self-pay

## 2021-06-18 ENCOUNTER — Other Ambulatory Visit (HOSPITAL_BASED_OUTPATIENT_CLINIC_OR_DEPARTMENT_OTHER): Payer: Self-pay

## 2021-06-18 ENCOUNTER — Other Ambulatory Visit: Payer: Self-pay | Admitting: Neurology

## 2021-06-19 ENCOUNTER — Other Ambulatory Visit (HOSPITAL_BASED_OUTPATIENT_CLINIC_OR_DEPARTMENT_OTHER): Payer: Self-pay

## 2021-06-19 MED ORDER — BUPROPION HCL ER (XL) 150 MG PO TB24
150.0000 mg | ORAL_TABLET | Freq: Every morning | ORAL | 5 refills | Status: DC
  Filled 2021-06-19: qty 30, 30d supply, fill #0

## 2021-06-19 MED ORDER — SERTRALINE HCL 100 MG PO TABS
ORAL_TABLET | ORAL | 5 refills | Status: AC
  Filled 2021-06-19: qty 30, 30d supply, fill #0
  Filled 2021-10-13: qty 30, 30d supply, fill #1

## 2021-06-19 MED ORDER — PRAVASTATIN SODIUM 20 MG PO TABS
ORAL_TABLET | ORAL | 5 refills | Status: DC
  Filled 2021-06-19 – 2021-07-31 (×2): qty 30, 30d supply, fill #0
  Filled 2021-08-26: qty 30, 30d supply, fill #1
  Filled 2021-09-28: qty 30, 30d supply, fill #2
  Filled 2021-10-13 – 2021-10-21 (×2): qty 30, 30d supply, fill #3

## 2021-06-26 ENCOUNTER — Other Ambulatory Visit: Payer: Self-pay | Admitting: Neurology

## 2021-06-26 ENCOUNTER — Other Ambulatory Visit (HOSPITAL_BASED_OUTPATIENT_CLINIC_OR_DEPARTMENT_OTHER): Payer: Self-pay

## 2021-06-29 ENCOUNTER — Telehealth: Payer: Self-pay | Admitting: Neurology

## 2021-06-29 ENCOUNTER — Other Ambulatory Visit (HOSPITAL_BASED_OUTPATIENT_CLINIC_OR_DEPARTMENT_OTHER): Payer: Self-pay

## 2021-06-29 ENCOUNTER — Other Ambulatory Visit: Payer: Self-pay

## 2021-06-29 MED ORDER — ONDANSETRON 8 MG PO TBDP
8.0000 mg | ORAL_TABLET | Freq: Three times a day (TID) | ORAL | 0 refills | Status: DC | PRN
  Filled 2021-06-29: qty 20, 7d supply, fill #0

## 2021-06-29 NOTE — Telephone Encounter (Signed)
Attempted to call pt, LVM for call back  °

## 2021-06-29 NOTE — Telephone Encounter (Signed)
At 1:49 pt left a very brief vm asking that Marcelino Duster, RN calls her re: her medications, please call.

## 2021-06-30 ENCOUNTER — Other Ambulatory Visit (HOSPITAL_BASED_OUTPATIENT_CLINIC_OR_DEPARTMENT_OTHER): Payer: Self-pay

## 2021-06-30 MED ORDER — FLUDROCORTISONE ACETATE 0.1 MG PO TABS
0.1000 mg | ORAL_TABLET | Freq: Three times a day (TID) | ORAL | 3 refills | Status: DC
  Filled 2021-06-30: qty 90, 30d supply, fill #0
  Filled 2021-08-19: qty 90, 30d supply, fill #1
  Filled 2021-09-28: qty 90, 30d supply, fill #2

## 2021-06-30 NOTE — Telephone Encounter (Signed)
Pt c/o frequent dizziness, states she had a bad episode this past weekend and meclizine did not relief symptoms. Pt currently takes  Midodrine 10mg  tid. Pt asked what other options are available to help with dizziness.

## 2021-06-30 NOTE — Telephone Encounter (Signed)
Please ask more detail about dizziness,  1.  This is positional related lightheadedness, that can be associated with dehydration, orthostatic blood pressure change  Versus  2.  Vertigo, spinning around sensation,  3.  Does she have headache  4.  Besides meclizine, what other medication she has tried  5.  How long this has been going on

## 2021-06-30 NOTE — Telephone Encounter (Signed)
Rx refilled.

## 2021-07-01 NOTE — Telephone Encounter (Signed)
States dizziness is a spinning around sensation not related to her blood pressure.  Dizziness started Saturday 06/27/21 when she woke up  Started as a headache, then turned to spinning sensation along with N&V. She has only tried meclizine and Dramamine.

## 2021-07-02 ENCOUNTER — Other Ambulatory Visit: Payer: Self-pay | Admitting: *Deleted

## 2021-07-02 ENCOUNTER — Other Ambulatory Visit (HOSPITAL_BASED_OUTPATIENT_CLINIC_OR_DEPARTMENT_OTHER): Payer: Self-pay

## 2021-07-02 MED ORDER — TIZANIDINE HCL 2 MG PO TABS
ORAL_TABLET | Freq: Three times a day (TID) | ORAL | 1 refills | Status: DC | PRN
  Filled 2021-07-02: qty 30, 10d supply, fill #0

## 2021-07-02 NOTE — Telephone Encounter (Signed)
Please check on her dizziness, ask if she has any headaches, she may try to treated as a migraine variant,

## 2021-07-02 NOTE — Telephone Encounter (Signed)
Pt has called Michelle, RN back. Please call pt °

## 2021-07-02 NOTE — Telephone Encounter (Signed)
Left message for a return call

## 2021-07-02 NOTE — Telephone Encounter (Signed)
Dizziness started on 06/27/21, along with headache. More constant rather than positional. Each day, she has noticed some improvement in symptoms. She has only tried meclizine, then ondansetron yesterday. She is feeling better today. I told her to make sure she is staying well hydrated. For her migraines, she can take her sumatriptan. This medication can even be used in combination with tizanidine, Aleve and ondansetron. I made sure she had refills on those prescriptions. She was in agreement with this plan.

## 2021-07-07 ENCOUNTER — Other Ambulatory Visit (HOSPITAL_BASED_OUTPATIENT_CLINIC_OR_DEPARTMENT_OTHER): Payer: Self-pay

## 2021-07-07 ENCOUNTER — Other Ambulatory Visit: Payer: Self-pay | Admitting: Internal Medicine

## 2021-07-07 DIAGNOSIS — E890 Postprocedural hypothyroidism: Secondary | ICD-10-CM

## 2021-07-07 MED ORDER — SYNTHROID 88 MCG PO TABS
88.0000 ug | ORAL_TABLET | Freq: Every day | ORAL | 3 refills | Status: AC
  Filled 2021-07-07: qty 90, 90d supply, fill #0
  Filled 2021-10-13: qty 90, 90d supply, fill #1

## 2021-07-22 ENCOUNTER — Ambulatory Visit: Payer: PPO | Admitting: Neurology

## 2021-07-22 ENCOUNTER — Other Ambulatory Visit (HOSPITAL_BASED_OUTPATIENT_CLINIC_OR_DEPARTMENT_OTHER): Payer: Self-pay

## 2021-07-22 VITALS — BP 145/78 | HR 70

## 2021-07-22 DIAGNOSIS — I951 Orthostatic hypotension: Secondary | ICD-10-CM | POA: Diagnosis not present

## 2021-07-22 DIAGNOSIS — G43709 Chronic migraine without aura, not intractable, without status migrainosus: Secondary | ICD-10-CM | POA: Diagnosis not present

## 2021-07-22 DIAGNOSIS — G459 Transient cerebral ischemic attack, unspecified: Secondary | ICD-10-CM

## 2021-07-22 DIAGNOSIS — H53132 Sudden visual loss, left eye: Secondary | ICD-10-CM

## 2021-07-22 DIAGNOSIS — G901 Familial dysautonomia [Riley-Day]: Secondary | ICD-10-CM

## 2021-07-22 DIAGNOSIS — K5909 Other constipation: Secondary | ICD-10-CM

## 2021-07-22 MED ORDER — SUMATRIPTAN SUCCINATE 100 MG PO TABS
ORAL_TABLET | ORAL | 11 refills | Status: AC
  Filled 2021-07-31: qty 9, 30d supply, fill #0
  Filled 2021-08-31: qty 9, 30d supply, fill #1
  Filled 2021-09-28: qty 9, 30d supply, fill #2
  Filled 2021-10-13 – 2021-10-21 (×2): qty 9, 30d supply, fill #3

## 2021-07-22 MED ORDER — PYRIDOSTIGMINE BROMIDE 60 MG PO TABS
60.0000 mg | ORAL_TABLET | Freq: Three times a day (TID) | ORAL | 3 refills | Status: DC | PRN
  Filled 2021-07-22: qty 90, 30d supply, fill #0

## 2021-07-22 MED ORDER — ONDANSETRON 8 MG PO TBDP
8.0000 mg | ORAL_TABLET | Freq: Three times a day (TID) | ORAL | 5 refills | Status: DC | PRN
Start: 2021-07-22 — End: 2021-11-04
  Filled 2021-07-22: qty 30, 10d supply, fill #0
  Filled 2021-09-23: qty 30, 10d supply, fill #1

## 2021-07-22 NOTE — Progress Notes (Signed)
BOTOX 200 UNITS w/ 2 vials  ?[id number] ?[phone] ?Exp-2025/03 ?B/B ?

## 2021-07-22 NOTE — Progress Notes (Signed)
PATIENT: Mallory Durham DOB: 05/28/1940  Chief Complaint  Patient presents with   Procedure    Rm 15,  Botox      HISTORICAL  Mallory Durham is a 81 year old female, seen in refer by primary care PA Mallory Durham, for evaluation of migraine headaches, chronic neck pain, initial evaluation was on June 01, 2017.  Reviewed and summarized the referring note, she has past medical history of hypothyroidism, on supplement, hyperlipidemia, chronic neck pain, chronic renal insufficiency,  Since 2016, she noticed progressive worsening dizziness, to the point now, she sits down most of the time, is not able to exercise, in addition, she is under a lot of stress, tried to see her husband at the nursing home on a daily basis.  She drinks 2 of 12 ounce of water bottle every day, but she complains lightheadedness shortly after standing up, mild improvement walking short distance, but with prolonged walking and standing, she would develop this washout sensation, lightheadedness, as if she is going to faint, she has to sit down to improve her symptoms.  During today's examination, she was found to have significant orthostatic blood pressure change in the setting of already low blood pressure, lying down blood pressure 97/70, heart rate of 83, standing up systolic blood pressure was 50 or less, required multiple measurement to register, she does become symptomatic, especially after standing up for more than 3 minutes.  Her symptoms gradually improved after lying flat.  Previously she was given prescription of midodrine 2.5 mg to get in the early morning, and before bed, there was no significant improvement noted.  She also noted gradual onset memory loss, difficulty focusing, she has history of migraine headache, increased migraine over the past few months, she could not tolerate Imitrex 100 mg as needed due to heart palpitation, she usually take half of 100 mg tablets, if needed, she may repeat  in couple hours, which take away the headache in few hours,  She is quite disabled by her dizziness, has to stay seated most of the time, she denies bilateral lower extremity swelling, no significant sensory loss.  UPDATE July 18 2017: MRI of the brain in February 2019, generalized atrophy, especially at the right perisylvian fissure area, mild supratentorium small vessel disease, there is no acute abnormality.  Laboratory evaluation showed A1c of 5.2, normal negative ANA, copper, C-reactive protein, ESR, RPR, B12, CMP, lipid profile, BNP was elevated 246   Significant low vitamin D 5.2,  Echocardiogram showed ejection fraction of 55-60%, wall motion was normal, aortic valve showed no stenosis, ventricular size was normal, systolic function was normal  She is taking gabapentin $RemoveBeforeDEI'600mg'[id number]$  bid, midodrine 10 mg 3 times a day, last dose was before she goes to bed, she also complains of chronic insomnia, taking Ambien 10 mg at nighttime  She still has depression, anxiety, she has frequent headaches, once or twice each day, she has been 50 mg as needed, 50 mg or Maxalt as needed for headaches, which helps her headache some  UPDATE November 01 2017: She is accompanied by her daughter Mallory Durham at today's clinical visit, functional status has much declined, despite midodrine 10 mg 3 times a day, and the Mestinon 60 mg 3 times a day, she complains of lightheadedness, dizziness when getting up just a few minutes, today she had significant orthostatic pressure dropped BJYN/829/56 to systolic 60 after standing up, difficult to register diastolic pressure.   She complains lightheadedness fainting sensation after lying down for a few minutes,blood  pressure points back to 120/80, her symptoms has much improved  She also complains of frequent migraine headaches, sometimes woke up with migraine headaches, taking Imitrex as needed  Chronic insomnia, Ambien 5 to 10 mg as needed  UPDATE Sept 9 2019: She is now taking  fludrocortisone 0.1 mg 3 times a day, which has really helped her symptoms, along with Mestinon 60 mg daily, she is scheduled to see [facility] on February 07, 2018,  She complains of chronic migraine headaches for many years,3-4 times each week, right lateralized severe pounding headaches, with associated light noise sensitivity, Imitrex 100 mg as needed was helpful, still her headache last about 6 hours,  UPDATE May 02 2018: She is now taking fludrocortisone 0.3 mg 3 times a day, Mestinon 60 mg 3 times a day, midodrine was increased from 2.5 to 5 mg 3 times a day, orthostatic symptoms has much improved,  She has almost daily headache, more on right temporal region, imitrex 171m 1/2 tab daily,   Reported 40% improvement with Botox injection, she has less headache, less severe, but benefit short lasting,  UPDATE Apr 02 2020: She suffered COVID-19 in early October 2021, recovering well, still complains of significant fatigue, orthostatic dizziness, fainting spells,  Reviewed evaluation by cardiologist Dr. SVirl Durham December 25, 2019, agree with current medication management, no longer take Mestinon, f still on fludrocortisone 0.1 mg 3 times a day, midodrine 10 mg 2-3 times a day,  Skin itching is well managed by gabapentin 300 mg 3 times a day, only occasionally Benadryl,  Migraine headache is overall under good control, Botox has really helped,  UPDATE Jul 02 2020: She complains of worsening orthostatic hypotension, dizziness, taking midodrine 10 mg twice a day, today sitting blood pressure 130/80, standing up 90/60  She continue have frequent headaches but improved with Botox injection, it is less frequent, easier to treat,  In addition, she reported 1 episode of sudden onset of dizziness, vertigo, nausea, double vision, followed by headache, lasting for 5 hours, this happened 2 to 3 weeks ago, now she is back to her baseline, had history of similar occurrence in the  past,  Update October 29, 2020: She is accompanied by her daughter, reported worsening headaches, worsening dizziness, lack of stamina, take frequent Excedrin Migraine, Botox as migraine prevention still works for her, but still the always 2-3 migraine each week,  UPDATE Sept 14 2022: She continues to decline slowly, spends most of the time sitting down, has frequent headaches, more than 20 days single month, despite Botox injection as preventive medication,  Continue have significant orthostatic hypotension,  Update April 22, 2021 She is accompanied by daughter, came in wheelchair today, she continues to have intermittent headache especially at the end of 3 months from previous Botox injection, 1-2 headaches a week, frequent dizziness result of static blood pressure change  UPDATE July 22 2021: Her headache is overall under good control using Botox injection, but she continue to decline, has worsening functional status, significant orthostatic hypotension, poor appetite, barely able to walk any extended length of time, spent most of the time in sitting position, she enjoys reading,  She had 1 episode of sudden onset left-sided blurry vision with associated lightheadedness in February 2023, last about 18 hours  REVIEW OF SYSTEMS: Full 14 system review of systems performed and notable only for above  All rest review of system were negative  ALLERGIES: No Known Allergies   HOME MEDICATIONS:  PAST MEDICAL HISTORY:  Past Medical History:  Diagnosis Date   2019 novel coronavirus disease (COVID-19)     Anxiety     Confusion, hx of, without neuro findings     Depression     Hx of vertigo     Hyperlipidemia     Hypothyroid     Left shoulder pain     Migraines     Neck pain     Orthostatic hypotension     Renal insufficiency        PAST SURGICAL HISTORY:      Past Surgical History:  Procedure Laterality Date   ABDOMINAL HYSTERECTOMY       ABDOMINAL HYSTERECTOMY        BREAST LUMPECTOMY       CHOLECYSTECTOMY       JOINT REPLACEMENT       THYROID SURGERY       TONSILLECTOMY          FAMILY HISTORY:      Family History  Problem Relation Age of Onset   Heart disease Mother          Angina   Cancer Mother     Cancer Father     Heart disease Father     Cancer Maternal Aunt        SOCIAL HISTORY:   Social History         Socioeconomic History   Marital status: Married      Spouse name: Not on file   Number of children: 2   Years of education: 16   Highest education level: Bachelor's degree (e.g., BA, AB, BS)  Occupational History      Employer: RETIRED  Tobacco Use   Smoking status: Never   Smokeless tobacco: Never  Vaping Use   Vaping Use: Never used  Substance and Sexual Activity   Alcohol use: No   Drug use: No   Sexual activity: Not on file  Other Topics Concern   Not on file  Social History Narrative    Lives at home alone.    Right-handed.    1-2 cups caffeine per day.    Social Determinants of Health    Financial Resource Strain: Not on file  Food Insecurity: Not on file  Transportation Needs: Not on file  Physical Activity: Not on file  Stress: Not on file  Social Connections: Not on file  Intimate Partner Violence: Not on file    PHYSICAL EXAMNIATION:  Blood pressure sitting down 160/110, 78; standing up 110/70, 85  Gen: NAD, conversant, well nourised, well groomed                     Cardiovascular: Regular rate rhythm, no peripheral edema, warm, nontender. Eyes: Conjunctivae clear without exudates or hemorrhage Neck: Supple, no carotid bruits. Pulmonary: Clear to auscultation bilaterally   NEUROLOGICAL EXAM:  MENTAL STATUS: Speech/Cognition: Awake, alert, normal speech, oriented to history taking and casual conversation.  CRANIAL NERVES: CN II: Visual fields are full to confrontation.  Pupils are round equal and briskly reactive to light. CN III, IV, VI: extraocular movement are normal. No ptosis. CN  V: Facial sensation is intact to light touch. CN VII: Face is symmetric with normal eye closure and smile. CN VIII: Hearing is normal to casual conversation CN IX, X: Palate elevates symmetrically. Phonation is normal. CN XI: Head turning and shoulder shrug are intact CN XII: Tongue is midline with normal movements and no atrophy.  MOTOR: Muscle bulk and tone are  normal. Muscle strength is normal.  REFLEXES: Reflexes are 2  and symmetric at the biceps, triceps, knees and ankles. Plantar responses are flexor.  SENSORY: Intact to light touch, pinprick, positional and vibratory sensation at fingers and toes.  COORDINATION: There is no trunk or limb ataxia.    GAIT/STANCE: Need push-up to get up from seated position, lightheaded with prolonged standing, rely on her walker  DIAGNOSTIC DATA (LABS, IMAGING, TESTING) - I reviewed patient records, labs, notes, testing and imaging myself where available.   ASSESSMENT AND PLAN  Drenda Sobecki is a 81 y.o. female   Autonomic failure  With significant orthostatic blood pressure changes,  No treatable etiology found, most consistent with central nervous system degenerative disorder,  She also reported occasional pseudobulbar phenomena  Patient is severely symptomatic from her profound autonomic failure, significant orthostatic blood pressure changes, essentially disabled from her symptoms,   Midodrine 59m tid, dose should be before 3 PM to avoid supine hypertension, continue fludrocortisone 0.1 mg 3 times a day, emphasized importance of increased water intake,  Add  on Mestinon 60 mg 3 times a day Chronic constipation frequent nausea  Zofran as needed  Laxative as needed  Acute onset of blurry vision of left eye in 2023  Concerning possible TIA,  Complete evaluation echocardiogram, and ultrasound of carotid artery  Chronic migraine headaches  Imitrex 100 mg as needed  Botox injection for chronic migraine prevention, injection was  performed according to [place] protocol,  5 units of Botox was injected into each side, for 31 injection sites, total of 155 units  Bilateral frontalis 4 injection sites Bilateral frontalis 2 injection sites Procerus 1 injection site Bilateral temporalis 8 injection sites Bilateral occipitalis 6 injection sites Bilateral cervical paraspinals 4 injection sites Bilateral upper trapezius 6 injection sites  Extra 45 unites were injected into bilateral masseters, and the right temporoparietal region and bilateral levator scapular  High co-pay with CGRP antagonist, did respond well to Imitrex, limit the use  YMarcial Pacas M.D. Ph.D.  [facility] [address] [place] [address]Ph: [phone]: [phone] CC: WFortino Durham PUtah

## 2021-07-23 ENCOUNTER — Other Ambulatory Visit (HOSPITAL_BASED_OUTPATIENT_CLINIC_OR_DEPARTMENT_OTHER): Payer: Self-pay

## 2021-07-24 ENCOUNTER — Other Ambulatory Visit (HOSPITAL_BASED_OUTPATIENT_CLINIC_OR_DEPARTMENT_OTHER): Payer: Self-pay

## 2021-07-26 MED ORDER — ONABOTULINUMTOXINA 100 UNITS IJ SOLR
100.0000 [IU] | Freq: Once | INTRAMUSCULAR | Status: AC
  Administered 2021-07-26: 100 [IU] via INTRAMUSCULAR

## 2021-07-27 ENCOUNTER — Other Ambulatory Visit (HOSPITAL_BASED_OUTPATIENT_CLINIC_OR_DEPARTMENT_OTHER): Payer: Self-pay

## 2021-07-31 ENCOUNTER — Ambulatory Visit (HOSPITAL_BASED_OUTPATIENT_CLINIC_OR_DEPARTMENT_OTHER)
Admission: RE | Admit: 2021-07-31 | Discharge: 2021-07-31 | Disposition: A | Discharge status: Home / Self Care | Payer: PPO | Source: Ambulatory Visit | Attending: Neurology | Admitting: Neurology

## 2021-07-31 ENCOUNTER — Other Ambulatory Visit: Payer: Self-pay

## 2021-07-31 ENCOUNTER — Other Ambulatory Visit (HOSPITAL_BASED_OUTPATIENT_CLINIC_OR_DEPARTMENT_OTHER): Payer: Self-pay

## 2021-07-31 DIAGNOSIS — K5909 Other constipation: Secondary | ICD-10-CM

## 2021-07-31 DIAGNOSIS — I951 Orthostatic hypotension: Secondary | ICD-10-CM | POA: Diagnosis present

## 2021-07-31 DIAGNOSIS — G901 Familial dysautonomia [Riley-Day]: Secondary | ICD-10-CM | POA: Diagnosis present

## 2021-07-31 DIAGNOSIS — H53132 Sudden visual loss, left eye: Secondary | ICD-10-CM | POA: Diagnosis present

## 2021-07-31 DIAGNOSIS — G459 Transient cerebral ischemic attack, unspecified: Secondary | ICD-10-CM

## 2021-07-31 DIAGNOSIS — G43709 Chronic migraine without aura, not intractable, without status migrainosus: Secondary | ICD-10-CM

## 2021-08-03 ENCOUNTER — Other Ambulatory Visit: Payer: Self-pay | Admitting: Family

## 2021-08-03 ENCOUNTER — Inpatient Hospital Stay: Payer: PPO | Admitting: Family

## 2021-08-03 ENCOUNTER — Other Ambulatory Visit: Payer: Self-pay

## 2021-08-03 ENCOUNTER — Inpatient Hospital Stay: Payer: PPO

## 2021-08-03 ENCOUNTER — Inpatient Hospital Stay: Payer: PPO | Attending: Family

## 2021-08-03 VITALS — BP 156/62 | HR 79 | Resp 17

## 2021-08-03 DIAGNOSIS — D509 Iron deficiency anemia, unspecified: Secondary | ICD-10-CM

## 2021-08-03 DIAGNOSIS — Z8616 Personal history of COVID-19: Secondary | ICD-10-CM | POA: Insufficient documentation

## 2021-08-03 DIAGNOSIS — N183 Chronic kidney disease, stage 3 unspecified: Secondary | ICD-10-CM | POA: Insufficient documentation

## 2021-08-03 DIAGNOSIS — E039 Hypothyroidism, unspecified: Secondary | ICD-10-CM | POA: Insufficient documentation

## 2021-08-03 DIAGNOSIS — Z79890 Hormone replacement therapy (postmenopausal): Secondary | ICD-10-CM | POA: Insufficient documentation

## 2021-08-03 DIAGNOSIS — Z90710 Acquired absence of both cervix and uterus: Secondary | ICD-10-CM | POA: Diagnosis not present

## 2021-08-03 DIAGNOSIS — G629 Polyneuropathy, unspecified: Secondary | ICD-10-CM | POA: Insufficient documentation

## 2021-08-03 DIAGNOSIS — D649 Anemia, unspecified: Secondary | ICD-10-CM

## 2021-08-03 DIAGNOSIS — E785 Hyperlipidemia, unspecified: Secondary | ICD-10-CM | POA: Insufficient documentation

## 2021-08-03 DIAGNOSIS — Z79899 Other long term (current) drug therapy: Secondary | ICD-10-CM | POA: Diagnosis not present

## 2021-08-03 DIAGNOSIS — Z7952 Long term (current) use of systemic steroids: Secondary | ICD-10-CM | POA: Insufficient documentation

## 2021-08-03 LAB — CBC WITH DIFFERENTIAL (CANCER CENTER ONLY)
Abs Immature Granulocytes: 0.16 10*3/uL — ABNORMAL HIGH (ref 0.00–0.07)
Basophils Absolute: 0.1 10*3/uL (ref 0.0–0.1)
Basophils Relative: 1 %
Eosinophils Absolute: 0.2 10*3/uL (ref 0.0–0.5)
Eosinophils Relative: 2 %
HCT: 30.3 % — ABNORMAL LOW (ref 36.0–46.0)
Hemoglobin: 8.2 g/dL — ABNORMAL LOW (ref 12.0–15.0)
Immature Granulocytes: 1 %
Lymphocytes Relative: 16 %
Lymphs Abs: 2.1 10*3/uL (ref 0.7–4.0)
MCH: 20.5 pg — ABNORMAL LOW (ref 26.0–34.0)
MCHC: 27.1 g/dL — ABNORMAL LOW (ref 30.0–36.0)
MCV: 75.8 fL — ABNORMAL LOW (ref 80.0–100.0)
Monocytes Absolute: 0.9 10*3/uL (ref 0.1–1.0)
Monocytes Relative: 7 %
Neutro Abs: 9.6 10*3/uL — ABNORMAL HIGH (ref 1.7–7.7)
Neutrophils Relative %: 73 %
Platelet Count: 461 10*3/uL — ABNORMAL HIGH (ref 150–400)
RBC: 4 MIL/uL (ref 3.87–5.11)
RDW: 18.2 % — ABNORMAL HIGH (ref 11.5–15.5)
WBC Count: 13 10*3/uL — ABNORMAL HIGH (ref 4.0–10.5)
nRBC: 0 % (ref 0.0–0.2)

## 2021-08-03 LAB — CMP (CANCER CENTER ONLY)
ALT: 9 U/L (ref 0–44)
AST: 12 U/L — ABNORMAL LOW (ref 15–41)
Albumin: 3.3 g/dL — ABNORMAL LOW (ref 3.5–5.0)
Alkaline Phosphatase: 77 U/L (ref 38–126)
Anion gap: 10 (ref 5–15)
BUN: 11 mg/dL (ref 8–23)
CO2: 32 mmol/L (ref 22–32)
Calcium: 8.7 mg/dL — ABNORMAL LOW (ref 8.9–10.3)
Chloride: 103 mmol/L (ref 98–111)
Creatinine: 1.06 mg/dL — ABNORMAL HIGH (ref 0.44–1.00)
GFR, Estimated: 53 mL/min — ABNORMAL LOW (ref >60)
Glucose, Bld: 95 mg/dL (ref 70–99)
Potassium: 3 mmol/L — ABNORMAL LOW (ref 3.5–5.1)
Sodium: 145 mmol/L (ref 135–145)
Total Bilirubin: 0.3 mg/dL (ref 0.3–1.2)
Total Protein: 6.2 g/dL — ABNORMAL LOW (ref 6.5–8.1)

## 2021-08-03 LAB — RETICULOCYTES
Immature Retic Fract: 32.1 % — ABNORMAL HIGH (ref 2.3–15.9)
RBC.: 3.96 MIL/uL (ref 3.87–5.11)
Retic Count, Absolute: 76.8 10*3/uL (ref 19.0–186.0)
Retic Ct Pct: 1.9 % (ref 0.4–3.1)

## 2021-08-03 LAB — SAVE SMEAR(SSMR), FOR PROVIDER SLIDE REVIEW

## 2021-08-03 LAB — LACTATE DEHYDROGENASE: LDH: 220 U/L — ABNORMAL HIGH (ref 98–192)

## 2021-08-03 MED ORDER — SODIUM CHLORIDE 0.9 % IV SOLN: Freq: Once | INTRAVENOUS | Status: AC

## 2021-08-03 MED ORDER — SODIUM CHLORIDE 0.9 % IV SOLN
1000.0000 mg | Freq: Once | INTRAVENOUS | Status: AC
  Administered 2021-08-03: 1000 mg via INTRAVENOUS
  Filled 2021-08-03: qty 10

## 2021-08-03 NOTE — Progress Notes (Signed)
Hematology/Oncology Consultation  ? ?Name: Mallory Durham      MRN: [id number]    Location: Room/bed info not found  Date: 08/03/2021 Time:3:12 PM ? ? ?REFERRING PHYSICIAN: Lorenda Hatchet, FNP ? ?REASON FOR CONSULT: Anemia unspecified ?  ?DIAGNOSIS: Iron deficiency anemia  ? ?HISTORY OF PRESENT ILLNESS: [name] is a very pleasant 81 yo caucasian female with recent diagnosis of iron deficiency anemia.  ?Hgb today is 8.2, MCV 75, platelets 461 and WBC count 13.0. ?She states that she remembers having iron deficiency with her pregnancies and with her hysterectomy years ago.  ?She has not noted any obvious blood loss.  ?She is quite pale at this time and notes SOB with exertion.  ?Her daughter also has history of iron deficiency anemia.  ?She states that she has history of stage III CKD from a medication she took years ago for migraines.  ?No history of diabetes.  ?She had her thyroid removed due to nodules and is on synthroid.  ?She has dizziness and poor balance associated with dysautonomia. She uses a wheelchair to get around. No falls or syncope to report.  ?She has 2 children and history of 2 miscarriages within the first 8 weeks of pregnancy.  ?No personal history of cancer. Her mother had breast cancer and her father had prostate.  ?No fever, chills, n/v, cough, rash, chest pain, palpitations, abdominal pain or changes in bowel or bladder habits.  ?She has occasional episodes of constipation.  ?No swelling or tenderness in her extremities.  ?She has positional numbness and tingling in her hands that resolves with moving around.  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  ?She said her appetite has improved over the last week. She is doing her best to stay well hydrated.  ?No smoking, ETOH or recreational drug use.  ? ?ROS: All other 10 point review of systems is negative.  ? ?PAST MEDICAL HISTORY:   ?Past Medical History:  ?Diagnosis Date  ? 2019 novel coronavirus disease  (COVID-19)   ? Anxiety   ? Confusion, hx of, without neuro findings   ? Depression   ? Hx of vertigo   ? Hyperlipidemia   ? Hypothyroid   ? Left shoulder pain   ? Migraines   ? Neck pain   ? Orthostatic hypotension   ? Renal insufficiency   ? ? ?ALLERGIES: ?No Known Allergies ?   ?MEDICATIONS:  ?Current Outpatient Medications on File Prior to Visit  ?Medication Sig Dispense Refill  ? aspirin-acetaminophen-caffeine (EXCEDRIN MIGRAINE) 250-250-65 MG tablet Take by mouth every 6 (six) hours as needed for headache.    ? botulinum toxin Type A (BOTOX) 100 units SOLR injection Inject IM every 3 months by MD in the office 2 vial 3  ? Cholecalciferol (D3-1000 PO) Take 1 tablet by mouth daily.    ? COVID-19 At Home Antigen Test [facility] COVID-19 HOME TEST) KIT Use as directed 4 each 0  ? fludrocortisone (FLORINEF) 0.1 MG tablet Take 1 tablet (0.1 mg total) by mouth 3 (three) times daily. 90 tablet 3  ? fluticasone (FLONASE) 50 MCG/ACT nasal spray Place 2 sprays into both nostrils daily. 18 g 0  ? fluticasone furoate-vilanterol (BREO ELLIPTA) 200-25 MCG/ACT AEPB Inhale 1 puff into the lungs daily. 30 each 2  ? gabapentin (NEURONTIN) 100 MG capsule TAKE 3 CAPSULES (300 MG TOTAL) BY MOUTH 3 (THREE) TIMES DAILY. 810 capsule 4  ? Galcanezumab-gnlm (EMGALITY) 120 MG/ML SOAJ Inject 120 mg into the  skin every 30 (thirty) days. Loading dose 149m two consecutive injections 2 mL 11  ? guaifenesin (HUMIBID E) 400 MG TABS tablet Take 1 tablet by mouth 3 times daily as needed for chest congestion and cough 60 tablet 0  ? influenza vaccine adjuvanted (FLUAD QUADRIVALENT) 0.5 ML injection Inject into the muscle. 0.5 mL 0  ? Meth-Hyo-M Bl-Na Phos-Ph Sal (URIBEL) 118 MG CAPS Take 1 capsule by mouth 4 times daily for 7 days. 28 capsule 1  ? methylPREDNISolone (MEDROL DOSEPAK) 4 MG TBPK tablet Take 6 tablets by mouth for 1 day then 5 tablets for 1 day then 4 tablets for 1 day 3 tablets for 1 day 2 tablets for 1 day then 1 tablet for 1 day 21  tablet 0  ? midodrine (PROAMATINE) 10 MG tablet Take 1 tablet (10 mg total) by mouth 3 (three) times daily. 270 tablet 3  ? Multiple Vitamins-Minerals (PRESERVISION AREDS 2) CAPS Take 2 capsules by mouth.    ? ondansetron (ZOFRAN-ODT) 8 MG disintegrating tablet Take 1 tablet (8 mg total) by mouth every 8 (eight) hours as needed for nausea or vomiting. 30 tablet 5  ? potassium chloride (KLOR-CON) 10 MEQ tablet TAKE 2 TABLETS BY MOUTH 2 TIMES DAILY 120 tablet 11  ? pravastatin (PRAVACHOL) 20 MG tablet Take 1 tablet (20 mg total) by mouth nightly. 30 tablet 5  ? Probiotic Product (PROBIOTIC PO) Take 1 capsule by mouth as directed.    ? promethazine-dextromethorphan (PROMETHAZINE-DM) 6.25-15 MG/5ML syrup Take 5 mLs by mouth 4 (four) times daily as needed for cough. 180 mL 0  ? pyridostigmine (MESTINON) 60 MG tablet Take 1 tablet (60 mg total) by mouth 3 (three) times daily as needed. 90 tablet 3  ? sertraline (ZOLOFT) 100 MG tablet Take 1 tablet by mouth in the morning 30 tablet 5  ? sodium fluoride (SF 5000 PLUS) 1.1 % CREA dental cream Brush teeth 3 to 5 minutes at bedtime 51 g 0  ? SUMAtriptan (IMITREX) 100 MG tablet Take 1 tab at onset of migraine.  May repeat in 2 hrs, if needed.  Max dose: 2 tabs/day. This is a 30 day prescription. 9 tablet 11  ? SYNTHROID 88 MCG tablet Take 1 tablet (88 mcg total) by mouth daily before breakfast. 90 tablet 3  ? tiZANidine (ZANAFLEX) 2 MG tablet TAKE 1 TABLET BY MOUTH EVERY 8 HOURS AS NEEDED FOR MUSCLE SPASMS 30 tablet 1  ? vitamin B-12 (CYANOCOBALAMIN) 1000 MCG tablet Take 1,000 mcg by mouth daily.    ? zolpidem (AMBIEN) 10 MG tablet Take 1 tablet (10 mg total) by mouth at bedtime as needed for sleep. 30 tablet 5  ? [DISCONTINUED] buPROPion (WELLBUTRIN XL) 150 MG 24 hr tablet Take 1 tablet (150 mg total) by mouth every morning. 30 tablet 5  ? ?No current facility-administered medications on file prior to visit.  ? ?  ?PAST SURGICAL HISTORY ?Past Surgical History:  ?Procedure  Laterality Date  ? ABDOMINAL HYSTERECTOMY    ? ABDOMINAL HYSTERECTOMY    ? BREAST LUMPECTOMY    ? CHOLECYSTECTOMY    ? JOINT REPLACEMENT    ? THYROID SURGERY    ? TONSILLECTOMY    ? ? ?FAMILY HISTORY: ?Family History  ?Problem Relation Age of Onset  ? Heart disease Mother   ?     Angina  ? Cancer Mother   ? Cancer Father   ? Heart disease Father   ? Cancer Maternal Aunt   ? ? ?SOCIAL HISTORY: ?  reports that she has never smoked. She has never used smokeless tobacco. She reports that she does not drink alcohol and does not use drugs. ? ?PERFORMANCE STATUS: ?The patient's performance status is 2 - Symptomatic, <50% confined to bed ? ?PHYSICAL EXAM: ?Most Recent Vital Signs: There were no vitals taken for this visit. ?BP 139/85 (BP Location: Right Wrist, Patient Position: Sitting)   Pulse 86   Temp 98.2 ?F (36.8 ?C) (Oral)   Resp 18   Wt 203 lb 1.9 oz (92.1 kg)   SpO2 99%   BMI 31.81 kg/m?  ? ?General Appearance:    Alert, cooperative, no distress, appears stated age  ?Head:    Normocephalic, without obvious abnormality, atraumatic  ?Eyes:    PERRL, conjunctiva/corneas clear, EOM's intact, fundi  ?  benign, both eyes  ?   ?   ?Throat:   Lips, mucosa, and tongue normal; teeth and gums normal  ?Neck:   Supple, symmetrical, trachea midline, no adenopathy;  ?  thyroid:  no enlargement/tenderness/nodules; no carotid ?  bruit or JVD  ?Back:     Symmetric, no curvature, ROM normal, no CVA tenderness  ?Lungs:     Clear to auscultation bilaterally, respirations unlabored  ?Chest Wall:    No tenderness or deformity  ? Heart:    Regular rate and rhythm, S1 and S2 normal, no murmur, rub   or gallop  ?   ?Abdomen:     Soft, non-tender, bowel sounds active all four quadrants,  ?  no masses, no organomegaly  ?   ?   ?Extremities:   Extremities normal, atraumatic, no cyanosis or edema  ?Pulses:   2+ and symmetric all extremities  ?Skin:   Skin color, texture, turgor normal, no rashes or lesions  ?Lymph nodes:   Cervical,  supraclavicular, and axillary nodes normal  ?Neurologic:   CNII-XII intact, normal strength, sensation and reflexes  ?  throughout  ? ? ?LABORATORY DATA:  ?Results for orders placed or performed in visit on 08/03/21 (from th

## 2021-08-03 NOTE — Patient Instructions (Signed)

## 2021-08-04 ENCOUNTER — Other Ambulatory Visit (HOSPITAL_BASED_OUTPATIENT_CLINIC_OR_DEPARTMENT_OTHER): Payer: Self-pay

## 2021-08-04 ENCOUNTER — Ambulatory Visit (HOSPITAL_BASED_OUTPATIENT_CLINIC_OR_DEPARTMENT_OTHER)
Admission: RE | Admit: 2021-08-04 | Discharge: 2021-08-04 | Disposition: A | Discharge status: Home / Self Care | Payer: PPO | Source: Ambulatory Visit | Attending: Neurology | Admitting: Neurology

## 2021-08-04 DIAGNOSIS — G901 Familial dysautonomia [Riley-Day]: Secondary | ICD-10-CM | POA: Diagnosis present

## 2021-08-04 DIAGNOSIS — I951 Orthostatic hypotension: Secondary | ICD-10-CM | POA: Insufficient documentation

## 2021-08-04 DIAGNOSIS — G459 Transient cerebral ischemic attack, unspecified: Secondary | ICD-10-CM | POA: Diagnosis present

## 2021-08-04 DIAGNOSIS — K5909 Other constipation: Secondary | ICD-10-CM | POA: Diagnosis present

## 2021-08-04 DIAGNOSIS — G43709 Chronic migraine without aura, not intractable, without status migrainosus: Secondary | ICD-10-CM | POA: Insufficient documentation

## 2021-08-04 DIAGNOSIS — H53132 Sudden visual loss, left eye: Secondary | ICD-10-CM | POA: Diagnosis present

## 2021-08-04 LAB — ECHOCARDIOGRAM COMPLETE
AR max vel: 2.61 cm2
AV Area VTI: 2.57 cm2
AV Area mean vel: 2.69 cm2
AV Mean grad: 3 mmHg
AV Peak grad: 6 mmHg
Ao pk vel: 1.22 m/s
Area-P 1/2: 4.24 cm2
P 1/2 time: 455 msec
S' Lateral: 2.8 cm

## 2021-08-04 LAB — ERYTHROPOIETIN: Erythropoietin: 81.1 m[IU]/mL — ABNORMAL HIGH (ref 2.6–18.5)

## 2021-08-04 LAB — IRON AND IRON BINDING CAPACITY (CC-WL,HP ONLY)
Iron: 20 ug/dL — ABNORMAL LOW (ref 28–170)
Saturation Ratios: 4 % — ABNORMAL LOW (ref 10.4–31.8)
TIBC: 452 ug/dL — ABNORMAL HIGH (ref 250–450)
UIBC: 432 ug/dL (ref 148–442)

## 2021-08-04 LAB — FERRITIN: Ferritin: 10 ng/mL — ABNORMAL LOW (ref 11–307)

## 2021-08-04 MED ORDER — ZOSTER VAC RECOMB ADJUVANTED 50 MCG/0.5ML IM SUSR
INTRAMUSCULAR | 1 refills | Status: DC
  Filled 2021-08-04: qty 1, 1d supply, fill #0

## 2021-08-04 NOTE — Progress Notes (Signed)
?  Echocardiogram ?2D Echocardiogram has been performed. ? ?Mallory Durham ?08/04/2021, 2:44 PM ?

## 2021-08-05 ENCOUNTER — Telehealth: Payer: Self-pay | Admitting: *Deleted

## 2021-08-05 NOTE — Telephone Encounter (Signed)
Spoke with pt and informed of results. Pt verbalized understanding and expressed appreciation for the call. All questions answered. ?

## 2021-08-05 NOTE — Telephone Encounter (Signed)
-----   Message from Levert Feinstein, MD sent at 08/04/2021  5:10 PM EDT ----- ?Please call patient, echocardiogram showed no significant abnormalities. ?

## 2021-08-10 ENCOUNTER — Other Ambulatory Visit (HOSPITAL_BASED_OUTPATIENT_CLINIC_OR_DEPARTMENT_OTHER): Payer: Self-pay

## 2021-08-14 ENCOUNTER — Telehealth: Payer: Self-pay | Admitting: *Deleted

## 2021-08-14 NOTE — Telephone Encounter (Signed)
Message received from patient's daughter [name] requesting a call back regarding pt.'s lab work.  Call placed back to [place] notified per order of S. Eulas Post NP that lab results show iron deficiency and that blood work will be re-checked at pt.'s next appt on 09/14/21.  [name] states that pt has not been able to obtain stool sample for OB as of today, but they are working on it.  No further questions from [place] at this time.  ? ? ?

## 2021-08-19 ENCOUNTER — Other Ambulatory Visit (HOSPITAL_BASED_OUTPATIENT_CLINIC_OR_DEPARTMENT_OTHER): Payer: Self-pay

## 2021-08-26 ENCOUNTER — Other Ambulatory Visit: Payer: Self-pay | Admitting: Neurology

## 2021-08-26 ENCOUNTER — Other Ambulatory Visit (HOSPITAL_BASED_OUTPATIENT_CLINIC_OR_DEPARTMENT_OTHER): Payer: Self-pay

## 2021-08-26 MED ORDER — ZOLPIDEM TARTRATE 10 MG PO TABS
10.0000 mg | ORAL_TABLET | Freq: Every evening | ORAL | 5 refills | Status: DC | PRN
  Filled 2021-08-26: qty 30, 30d supply, fill #0
  Filled 2021-09-23: qty 30, 30d supply, fill #1

## 2021-08-26 NOTE — Telephone Encounter (Signed)
Verify Drug Registry For Zolpidem Tartrate 10 Mg Tablet ?Last Filled: 07/27/2021 ?Quantity: 30 tablets for 30 days ?Last appointment: 07/22/2021 ?Next appointment: 10/14/2021 ?

## 2021-08-27 ENCOUNTER — Telehealth: Payer: Self-pay | Admitting: *Deleted

## 2021-08-27 NOTE — Telephone Encounter (Signed)
Called patient about rescheduling appointment - confirmed  ?

## 2021-08-31 ENCOUNTER — Other Ambulatory Visit (HOSPITAL_BASED_OUTPATIENT_CLINIC_OR_DEPARTMENT_OTHER): Payer: Self-pay

## 2021-09-01 ENCOUNTER — Other Ambulatory Visit: Payer: Self-pay | Admitting: Pharmacist

## 2021-09-04 ENCOUNTER — Other Ambulatory Visit (HOSPITAL_BASED_OUTPATIENT_CLINIC_OR_DEPARTMENT_OTHER): Payer: Self-pay

## 2021-09-07 ENCOUNTER — Ambulatory Visit: Payer: PPO | Admitting: Family

## 2021-09-07 ENCOUNTER — Other Ambulatory Visit: Payer: PPO

## 2021-09-08 ENCOUNTER — Inpatient Hospital Stay: Payer: PPO | Attending: Family

## 2021-09-08 ENCOUNTER — Encounter: Payer: Self-pay | Admitting: Family

## 2021-09-08 ENCOUNTER — Ambulatory Visit (HOSPITAL_BASED_OUTPATIENT_CLINIC_OR_DEPARTMENT_OTHER)
Admission: RE | Admit: 2021-09-08 | Discharge: 2021-09-08 | Disposition: A | Discharge status: Home / Self Care | Payer: PPO | Source: Ambulatory Visit | Attending: Family | Admitting: Family

## 2021-09-08 ENCOUNTER — Inpatient Hospital Stay: Payer: PPO | Admitting: Family

## 2021-09-08 VITALS — BP 88/71 | HR 84 | Temp 98.4°F | Resp 18 | Wt 200.8 lb

## 2021-09-08 DIAGNOSIS — R051 Acute cough: Secondary | ICD-10-CM | POA: Insufficient documentation

## 2021-09-08 DIAGNOSIS — R110 Nausea: Secondary | ICD-10-CM | POA: Diagnosis not present

## 2021-09-08 DIAGNOSIS — R399 Unspecified symptoms and signs involving the genitourinary system: Secondary | ICD-10-CM | POA: Insufficient documentation

## 2021-09-08 DIAGNOSIS — K921 Melena: Secondary | ICD-10-CM | POA: Diagnosis not present

## 2021-09-08 DIAGNOSIS — Z79899 Other long term (current) drug therapy: Secondary | ICD-10-CM | POA: Diagnosis not present

## 2021-09-08 DIAGNOSIS — G629 Polyneuropathy, unspecified: Secondary | ICD-10-CM | POA: Diagnosis not present

## 2021-09-08 DIAGNOSIS — D509 Iron deficiency anemia, unspecified: Secondary | ICD-10-CM | POA: Insufficient documentation

## 2021-09-08 LAB — URINALYSIS, COMPLETE (UACMP) WITH MICROSCOPIC
Bilirubin Urine: NEGATIVE
Glucose, UA: NEGATIVE mg/dL
Hgb urine dipstick: NEGATIVE
Ketones, ur: NEGATIVE mg/dL
Nitrite: NEGATIVE
Protein, ur: NEGATIVE mg/dL
Specific Gravity, Urine: 1.01 (ref 1.005–1.030)
pH: 7 (ref 5.0–8.0)

## 2021-09-08 LAB — CBC WITH DIFFERENTIAL (CANCER CENTER ONLY)
Abs Immature Granulocytes: 0.21 10*3/uL — ABNORMAL HIGH (ref 0.00–0.07)
Basophils Absolute: 0.1 10*3/uL (ref 0.0–0.1)
Basophils Relative: 1 %
Eosinophils Absolute: 0.4 10*3/uL (ref 0.0–0.5)
Eosinophils Relative: 2 %
HCT: 38.5 % (ref 36.0–46.0)
Hemoglobin: 11.5 g/dL — ABNORMAL LOW (ref 12.0–15.0)
Immature Granulocytes: 1 %
Lymphocytes Relative: 14 %
Lymphs Abs: 2.5 10*3/uL (ref 0.7–4.0)
MCH: 25.6 pg — ABNORMAL LOW (ref 26.0–34.0)
MCHC: 29.9 g/dL — ABNORMAL LOW (ref 30.0–36.0)
MCV: 85.6 fL (ref 80.0–100.0)
Monocytes Absolute: 0.8 10*3/uL (ref 0.1–1.0)
Monocytes Relative: 5 %
Neutro Abs: 13.4 10*3/uL — ABNORMAL HIGH (ref 1.7–7.7)
Neutrophils Relative %: 77 %
Platelet Count: 377 10*3/uL (ref 150–400)
RBC: 4.5 MIL/uL (ref 3.87–5.11)
WBC Count: 17.4 10*3/uL — ABNORMAL HIGH (ref 4.0–10.5)
nRBC: 0 % (ref 0.0–0.2)

## 2021-09-08 LAB — CMP (CANCER CENTER ONLY)
ALT: 11 U/L (ref 0–44)
AST: 19 U/L (ref 15–41)
Albumin: 2.9 g/dL — ABNORMAL LOW (ref 3.5–5.0)
Alkaline Phosphatase: 71 U/L (ref 38–126)
Anion gap: 10 (ref 5–15)
BUN: 8 mg/dL (ref 8–23)
CO2: 35 mmol/L — ABNORMAL HIGH (ref 22–32)
Calcium: 8.9 mg/dL (ref 8.9–10.3)
Chloride: 99 mmol/L (ref 98–111)
Creatinine: 1.06 mg/dL — ABNORMAL HIGH (ref 0.44–1.00)
GFR, Estimated: 53 mL/min — ABNORMAL LOW (ref >60)
Glucose, Bld: 103 mg/dL — ABNORMAL HIGH (ref 70–99)
Potassium: 3.2 mmol/L — ABNORMAL LOW (ref 3.5–5.1)
Sodium: 144 mmol/L (ref 135–145)
Total Bilirubin: 0.4 mg/dL (ref 0.3–1.2)
Total Protein: 6.4 g/dL — ABNORMAL LOW (ref 6.5–8.1)

## 2021-09-08 LAB — RETICULOCYTES
Immature Retic Fract: 25.9 % — ABNORMAL HIGH (ref 2.3–15.9)
RBC.: 4.48 MIL/uL (ref 3.87–5.11)
Retic Count, Absolute: 110.2 10*3/uL (ref 19.0–186.0)
Retic Ct Pct: 2.5 % (ref 0.4–3.1)

## 2021-09-08 LAB — FERRITIN: Ferritin: 108 ng/mL (ref 11–307)

## 2021-09-08 NOTE — Progress Notes (Signed)
?Hematology and Oncology Follow Up Visit ? ?Mallory Durham ?[id number] ?1941-02-03 81 y.o. ?09/08/2021 ? ? ?Principle Diagnosis:  ?Iron deficiency anemia  ? ?Current Therapy:   ?IV iron as indicated  ?  ?Interim History:  Mallory Durham is here today with her daughter for follow-up. She notes that her energy is a little better.  ?Hgb is now up to 11.5, MCV 85, platelets 377 and WBC count 17.4.  ?She has a productive cough with some mild SOB with any exertion. She also states that she is prone to UTI's.  ?UA showed trace leukocytes and rare bacteria. Culture pending.  ?We also got a chest xray and results are pending. She has had a few episodes of chills as well.  ?No fever, rash, dizziness, chest pain, palpitations, abdominal pain or changes in bowel habits.  ?She has not noted any obvious bleeding. No bruising or petechiae.  ?She has occasional nausea, no vomiting.  ?Neuropathy unchanged from baseline.  ?No swelling in her extremities noted at this time.  ?She states that her appetite is ok and she is doing her best to stay well hydrated. Her weight is stable at 200 lbs.  ? ?ECOG Performance Status: 1 - Symptomatic but completely ambulatory ? ?Medications:  ?Allergies as of 09/08/2021   ?No Known Allergies ?  ? ?  ?Medication List  ?  ? ?  ? Accurate as of September 08, 2021  2:17 PM. If you have any questions, ask your nurse or doctor.  ?  ?  ? ?  ? ?aspirin-acetaminophen-caffeine 250-250-65 MG tablet ?Commonly known as: Barnes ?Take by mouth every 6 (six) hours as needed for headache. ?  ?botulinum toxin Type A 100 units Solr injection ?Commonly known as: BOTOX ?Inject IM every 3 months by MD in the office ?  ?Carestart COVID-19 Home Test Kit ?Generic drug: COVID-19 At Home Antigen Test ?Use as directed ?  ?D3-1000 PO ?Take 1 tablet by mouth daily. ?  ?Emgality 120 MG/ML Soaj ?Generic drug: Galcanezumab-gnlm ?Inject 120 mg into the skin every 30 (thirty) days. Loading dose 135m two consecutive injections ?   ?Fluad Quadrivalent 0.5 ML injection ?Generic drug: influenza vaccine adjuvanted ?Inject into the muscle. ?  ?fludrocortisone 0.1 MG tablet ?Commonly known as: FLORINEF ?Take 1 tablet (0.1 mg total) by mouth 3 (three) times daily. ?  ?fluticasone 50 MCG/ACT nasal spray ?Commonly known as: FLONASE ?Place 2 sprays into both nostrils daily. ?  ?gabapentin 100 MG capsule ?Commonly known as: NEURONTIN ?TAKE 3 CAPSULES (300 MG TOTAL) BY MOUTH 3 (THREE) TIMES DAILY. ?  ?guaifenesin 400 MG Tabs tablet ?Commonly known as: HUMIBID E ?Take 1 tablet by mouth 3 times daily as needed for chest congestion and cough ?  ?methylPREDNISolone 4 MG Tbpk tablet ?Commonly known as: MEDROL DOSEPAK ?Take 6 tablets by mouth for 1 day then 5 tablets for 1 day then 4 tablets for 1 day 3 tablets for 1 day 2 tablets for 1 day then 1 tablet for 1 day ?  ?midodrine 10 MG tablet ?Commonly known as: PROAMATINE ?Take 1 tablet (10 mg total) by mouth 3 (three) times daily. ?  ?ondansetron 8 MG disintegrating tablet ?Commonly known as: ZOFRAN-ODT ?Take 1 tablet (8 mg total) by mouth every 8 (eight) hours as needed for nausea or vomiting. ?  ?potassium chloride 10 MEQ tablet ?Commonly known as: KLOR-CON M ?TAKE 2 TABLETS BY MOUTH 2 TIMES DAILY ?  ?pravastatin 20 MG tablet ?Commonly known as: PRAVACHOL ?Take 1 tablet (20 mg  total) by mouth nightly. ?  ?PreserVision AREDS 2 Caps ?Take 2 capsules by mouth. ?  ?PreviDent 5000 Plus 1.1 % Crea dental cream ?Generic drug: sodium fluoride ?Brush teeth 3 to 5 minutes at bedtime ?  ?PROBIOTIC PO ?Take 1 capsule by mouth as directed. ?  ?promethazine-dextromethorphan 6.25-15 MG/5ML syrup ?Commonly known as: PROMETHAZINE-DM ?Take 5 mLs by mouth 4 (four) times daily as needed for cough. ?  ?pyridostigmine 60 MG tablet ?Commonly known as: Mestinon ?Take 1 tablet (60 mg total) by mouth 3 (three) times daily as needed. ?  ?sertraline 100 MG tablet ?Commonly known as: ZOLOFT ?Take 1 tablet by mouth in the morning ?   ?Shingrix injection ?Generic drug: Zoster Vaccine Adjuvanted ?Inject into the muscle. ?  ?SUMAtriptan 100 MG tablet ?Commonly known as: IMITREX ?Take 1 tab at onset of migraine.  May repeat in 2 hrs, if needed.  Max dose: 2 tabs/day. This is a 30 day prescription. ?  ?Synthroid 88 MCG tablet ?Generic drug: levothyroxine ?Take 1 tablet (88 mcg total) by mouth daily before breakfast. ?  ?tiZANidine 2 MG tablet ?Commonly known as: ZANAFLEX ?TAKE 1 TABLET BY MOUTH EVERY 8 HOURS AS NEEDED FOR MUSCLE SPASMS ?  ?Uro-MP 118 MG Caps ?Take 1 capsule by mouth 4 times daily for 7 days. ?  ?vitamin B-12 1000 MCG tablet ?Commonly known as: CYANOCOBALAMIN ?Take 1,000 mcg by mouth daily. ?  ?zolpidem 10 MG tablet ?Commonly known as: AMBIEN ?Take 1 tablet (10 mg total) by mouth at bedtime as needed for sleep. ?  ? ?  ? ? ?Allergies: No Known Allergies ? ?Past Medical History, Surgical history, Social history, and Family History were reviewed and updated. ? ?Review of Systems: ?All other 10 point review of systems is negative.  ? ?Physical Exam: ? vitals were not taken for this visit.  ? ?Wt Readings from Last 3 Encounters:  ?08/03/21 203 lb 1.9 oz (92.1 kg)  ?10/29/20 205 lb (93 kg)  ?10/09/20 215 lb 3.2 oz (97.6 kg)  ? ? ?Ocular: Sclerae unicteric, pupils equal, round and reactive to light ?Ear-nose-throat: Oropharynx clear, dentition fair ?Lymphatic: No cervical or supraclavicular adenopathy ?Lungs no rales or rhonchi, good excursion bilaterally ?Heart regular rate and rhythm, no murmur appreciated ?Abd soft, nontender, positive bowel sounds ?MSK no focal spinal tenderness, no joint edema ?Neuro: non-focal, well-oriented, appropriate affect ?Breasts: Deferred  ? ?Lab Results  ?Component Value Date  ? WBC 13.0 (H) 08/03/2021  ? HGB 8.2 (L) 08/03/2021  ? HCT 30.3 (L) 08/03/2021  ? MCV 75.8 (L) 08/03/2021  ? PLT 461 (H) 08/03/2021  ? ?Lab Results  ?Component Value Date  ? FERRITIN 10 (L) 08/03/2021  ? IRON 20 (L) 08/03/2021  ? TIBC  452 (H) 08/03/2021  ? UIBC 432 08/03/2021  ? IRONPCTSAT 4 (L) 08/03/2021  ? ?Lab Results  ?Component Value Date  ? RETICCTPCT 1.9 08/03/2021  ? RBC 3.96 08/03/2021  ? ?No results found for: KPAFRELGTCHN, LAMBDASER, KAPLAMBRATIO ?No results found for: IGGSERUM, IGA, IGMSERUM ?No results found for: TOTALPROTELP, ALBUMINELP, A1GS, A2GS, BETS, BETA2SER, GAMS, MSPIKE, SPEI ?  Chemistry   ?   ?Component Value Date/Time  ? NA 145 08/03/2021 1436  ? NA 141 06/29/2019 1346  ? K 3.0 (L) 08/03/2021 1436  ? CL 103 08/03/2021 1436  ? CO2 32 08/03/2021 1436  ? BUN 11 08/03/2021 1436  ? BUN 13 06/29/2019 1346  ? CREATININE 1.06 (H) 08/03/2021 1436  ?    ?Component Value Date/Time  ?  CALCIUM 8.7 (L) 08/03/2021 1436  ? ALKPHOS 77 08/03/2021 1436  ? AST 12 (L) 08/03/2021 1436  ? ALT 9 08/03/2021 1436  ? BILITOT 0.3 08/03/2021 1436  ?  ? ? ? ?Impression and Plan: Mallory Durham is a very pleasant 81 yo caucasian female with iron deficiency anemia.  ?Iron studies are pending.  ?Urine culture and chest xray are pending. We will get her onto an antibiotic if needed.  ?Follow-up in 3 months.  ? ?Lottie Dawson, NP ?4/25/20232:17 PM ? ?

## 2021-09-09 ENCOUNTER — Telehealth: Payer: Self-pay

## 2021-09-09 ENCOUNTER — Encounter: Payer: Self-pay | Admitting: Family

## 2021-09-09 LAB — URINE CULTURE: Culture: NO GROWTH

## 2021-09-09 LAB — IRON AND IRON BINDING CAPACITY (CC-WL,HP ONLY)
Iron: 58 ug/dL (ref 28–170)
Saturation Ratios: 20 % (ref 10.4–31.8)
TIBC: 290 ug/dL (ref 250–450)
UIBC: 232 ug/dL

## 2021-09-09 NOTE — Telephone Encounter (Signed)
Called and informed patient of results, patient verbalized understanding and denies any questions or concerns at this time.  ? ?

## 2021-09-09 NOTE — Telephone Encounter (Signed)
-----   Message from Erenest Blank, NP sent at 09/09/2021  3:06 PM EDT ----- ?Chest xray did not show any evidence of infection. UA and culture are negative for UTI so far. Thank you! ? ? ?----- Message ----- ?From: Interface, Rad Results In ?Sent: 09/09/2021   2:09 PM EDT ?To: Erenest Blank, NP ? ? ?

## 2021-09-10 ENCOUNTER — Other Ambulatory Visit (HOSPITAL_BASED_OUTPATIENT_CLINIC_OR_DEPARTMENT_OTHER): Payer: Self-pay

## 2021-09-14 ENCOUNTER — Inpatient Hospital Stay: Payer: PPO

## 2021-09-14 ENCOUNTER — Ambulatory Visit: Payer: PPO | Admitting: Family

## 2021-09-16 ENCOUNTER — Encounter: Payer: Self-pay | Admitting: Gastroenterology

## 2021-09-17 ENCOUNTER — Telehealth: Payer: Self-pay | Admitting: *Deleted

## 2021-09-17 NOTE — Telephone Encounter (Signed)
Per 09/08/21 los - called and gave upcoming appointments - confirmed ?

## 2021-09-21 ENCOUNTER — Other Ambulatory Visit (HOSPITAL_BASED_OUTPATIENT_CLINIC_OR_DEPARTMENT_OTHER): Payer: Self-pay

## 2021-09-21 ENCOUNTER — Encounter: Payer: Self-pay | Admitting: Gastroenterology

## 2021-09-21 ENCOUNTER — Ambulatory Visit (INDEPENDENT_AMBULATORY_CARE_PROVIDER_SITE_OTHER): Payer: PPO | Admitting: Gastroenterology

## 2021-09-21 VITALS — BP 98/70 | Ht 68.0 in | Wt 198.0 lb

## 2021-09-21 DIAGNOSIS — R110 Nausea: Secondary | ICD-10-CM | POA: Diagnosis not present

## 2021-09-21 DIAGNOSIS — R6881 Early satiety: Secondary | ICD-10-CM | POA: Diagnosis not present

## 2021-09-21 DIAGNOSIS — R195 Other fecal abnormalities: Secondary | ICD-10-CM | POA: Diagnosis not present

## 2021-09-21 DIAGNOSIS — D509 Iron deficiency anemia, unspecified: Secondary | ICD-10-CM | POA: Diagnosis not present

## 2021-09-21 DIAGNOSIS — R630 Anorexia: Secondary | ICD-10-CM

## 2021-09-21 MED ORDER — CLENPIQ 10-3.5-12 MG-GM -GM/160ML PO SOLN
1.0000 | ORAL | 0 refills | Status: DC
  Filled 2021-09-21: qty 320, 1d supply, fill #0

## 2021-09-21 MED ORDER — SCOPOLAMINE 1 MG/3DAYS TD PT72
1.0000 | MEDICATED_PATCH | Freq: Once | TRANSDERMAL | 0 refills | Status: AC
  Filled 2021-09-21: qty 1, 1d supply, fill #0

## 2021-09-21 MED ORDER — METOCLOPRAMIDE HCL 5 MG PO TABS
5.0000 mg | ORAL_TABLET | Freq: Three times a day (TID) | ORAL | 0 refills | Status: DC
  Filled 2021-09-21: qty 6, 2d supply, fill #0

## 2021-09-21 NOTE — Progress Notes (Signed)
? ? ?          ?Chief Complaint: Iron deficiency anemia, heme+ stools ? ? ?Referring Provider: [name], [name], NP ? ? ? ?HPI:   ? ? ?Mallory RoyaltyLinda Durham is a 81 y.o. female with a history of HLD, hypothyroidism, migraines, orthostatic hypotension, hysterectomy, cholecystectomy, thyroid surgery, referred to the Gastroenterology Clinic for evaluation of iron deficiency anemia and hematochezia. ? ?She follows in the Hematology Clinic for her IDA, last seen on 09/08/2021.  Hemoglobin improving and improved energy with IV iron. ? ?Recent Hgb nadir of 8.2 with MCV 76 and ferritin 10, iron 20, TIBC 452, sat 4% in 07/2021.  All improved after IV iron.  Normal B12, folate, CMP. No blood transfusion.  ? ?Denies any overt GI blood loss to include hematochezia, melena.  FOBT positive during recent evaluation in the Hematology office.  Does take Excedrin prn migraines, otherwise no NSAIDs or anticoagulation. ? ?Occasional nausea, but no emesis. No abdominal pain. Improves with Zofran prn. Has been present 6 months or so. Early satiety and decreased appetite recently. Weight stable.  ? ?No prior EGD. Has had a couple normal colonoscopies in the past, with [place].  No prior records available for review. ? ?No known family history of CRC, GI malignancy, liver disease, pancreatic disease, or IBD. ? ? ?No recent abdominal imaging for review.  Essentially normal TTE in 07/2021 with EF 60-65%. ? ?Presents to the office with her son-in-law, along with daughter Banker(RN at [employer] [facility] [employer]) on speaker phone. ? ? ?  Latest Ref Rng & Units 09/08/2021  ?  2:05 PM 08/03/2021  ?  2:36 PM 05/15/2019  ? 12:47 PM  ?CBC  ?WBC 4.0 - 10.5 K/uL 17.4   13.0   9.2    ?Hemoglobin 12.0 - 15.0 g/dL 16.111.5   8.2   09.613.1    ?Hematocrit 36.0 - 46.0 % 38.5   30.3   38.6    ?Platelets 150 - 400 K/uL 377   461   307    ? ? ? ? ?Past Medical History:  ?Diagnosis Date  ? 2019 novel coronavirus disease (COVID-19)   ? Anxiety   ?  Confusion, hx of, without neuro findings   ? Depression   ? Hx of vertigo   ? Hyperlipidemia   ? Hypothyroid   ? Left shoulder pain   ? Migraines   ? Neck pain   ? Orthostatic hypotension   ? Renal insufficiency   ? ? ? ?Past Surgical History:  ?Procedure Laterality Date  ? ABDOMINAL HYSTERECTOMY    ? ABDOMINAL HYSTERECTOMY    ? BREAST LUMPECTOMY    ? CHOLECYSTECTOMY    ? COLONOSCOPY    ? [place] [facility] [place] [facility] [place] years ago  ? JOINT REPLACEMENT    ? THYROID SURGERY    ? TONSILLECTOMY    ? ?Family History  ?Problem Relation Age of Onset  ? Heart disease Mother   ?     Angina  ? Cancer Mother   ?     Breast  ? Cancer Father   ?     Prostate  ? Heart disease Father   ? Cancer Maternal Aunt   ? Colon cancer Neg Hx   ? Esophageal cancer Neg Hx   ? ?Social History  ? ?Tobacco Use  ? Smoking status: Never  ? Smokeless tobacco: Never  ?Vaping Use  ? Vaping Use: Never used  ?Substance Use  Topics  ? Alcohol use: No  ? Drug use: No  ? ?Current Outpatient Medications  ?Medication Sig Dispense Refill  ? aspirin-acetaminophen-caffeine (EXCEDRIN MIGRAINE) 250-250-65 MG tablet Take by mouth every 6 (six) hours as needed for headache.    ? botulinum toxin Type A (BOTOX) 100 units SOLR injection Inject IM every 3 months by MD in the office 2 vial 3  ? Cholecalciferol (D3-1000 PO) Take 1 tablet by mouth daily.    ? diphenhydrAMINE (BENADRYL) 25 mg capsule Take 25 mg by mouth every 6 (six) hours as needed.    ? fludrocortisone (FLORINEF) 0.1 MG tablet Take 1 tablet (0.1 mg total) by mouth 3 (three) times daily. 90 tablet 3  ? gabapentin (NEURONTIN) 100 MG capsule TAKE 3 CAPSULES (300 MG TOTAL) BY MOUTH 3 (THREE) TIMES DAILY. 810 capsule 4  ? midodrine (PROAMATINE) 10 MG tablet Take 1 tablet (10 mg total) by mouth 3 (three) times daily. 270 tablet 3  ? Multiple Vitamins-Minerals (PRESERVISION AREDS 2) CAPS Take 2 capsules by mouth.    ? ondansetron (ZOFRAN-ODT) 8 MG disintegrating tablet Take 1 tablet (8 mg total) by mouth  every 8 (eight) hours as needed for nausea or vomiting. 30 tablet 5  ? pravastatin (PRAVACHOL) 20 MG tablet Take 1 tablet (20 mg total) by mouth nightly. 30 tablet 5  ? Probiotic Product (PROBIOTIC PO) Take 1 capsule by mouth as directed.    ? pyridostigmine (MESTINON) 60 MG tablet Take 1 tablet (60 mg total) by mouth 3 (three) times daily as needed. 90 tablet 3  ? sertraline (ZOLOFT) 100 MG tablet Take 1 tablet by mouth in the morning 30 tablet 5  ? sodium fluoride (SF 5000 PLUS) 1.1 % CREA dental cream Brush teeth 3 to 5 minutes at bedtime 51 g 0  ? SUMAtriptan (IMITREX) 100 MG tablet Take 1 tab at onset of migraine.  May repeat in 2 hrs, if needed.  Max dose: 2 tabs/day. This is a 30 day prescription. 9 tablet 11  ? SYNTHROID 88 MCG tablet Take 1 tablet (88 mcg total) by mouth daily before breakfast. 90 tablet 3  ? tiZANidine (ZANAFLEX) 2 MG tablet TAKE 1 TABLET BY MOUTH EVERY 8 HOURS AS NEEDED FOR MUSCLE SPASMS 30 tablet 1  ? vitamin B-12 (CYANOCOBALAMIN) 1000 MCG tablet Take 1,000 mcg by mouth daily.    ? zolpidem (AMBIEN) 10 MG tablet Take 1 tablet (10 mg total) by mouth at bedtime as needed for sleep. 30 tablet 5  ? Zoster Vaccine Adjuvanted [facility]) injection Inject into the muscle. (Patient not taking: Reported on 09/08/2021) 1 each 1  ? ?No current facility-administered medications for this visit.  ? ?No Known Allergies ? ? ?Review of Systems: ?All systems reviewed and negative except where noted in HPI.  ? ? ? ?Physical Exam:   ? ?Wt Readings from Last 3 Encounters:  ?09/21/21 198 lb (89.8 kg)  ?09/08/21 200 lb 12.8 oz (91.1 kg)  ?08/03/21 203 lb 1.9 oz (92.1 kg)  ? ? ?BP 98/70   Ht 5\' 8"  (1.727 m)   Wt 198 lb (89.8 kg) Comment: verbalized  BMI 30.11 kg/m?  ?Constitutional:  Pleasant, in no acute distress.  Sitting in wheelchair ?Psychiatric: Normal mood and affect. Behavior is normal. ?Cardiovascular: Normal rate, regular rhythm. No edema ?Pulmonary/chest: Effort normal and breath sounds normal. No  wheezing, rales or rhonchi. ?Abdominal: Soft, nondistended, nontender. Bowel sounds active throughout. There are no masses palpable. No hepatomegaly. ?Neurological: Alert and oriented to person  place and time. ?Skin: Skin is warm and dry. No rashes noted. ? ? ?ASSESSMENT AND PLAN;  ? ?1) Iron deficiency anemia ?2) Heme positive stool ?3) Nausea ?4) Early satiety ?5) Decreased appetite ? ?Discussed pathophysiology and potential GI etiologies of IDA at length today.  Discussed possible multifactorial nature of her early satiety, nausea, and decreased appetite.  Discussed diagnostic and potentially therapeutic work-up at length, to include discussion with daughter by speaker phone, with the following plan: ? ?- EGD and colonoscopy for diagnostic and potentially therapeutic intent ?- EGD and colonoscopy to be performed at [facility] Endoscopy unit due to age, comorbidities, mobility issues, and per patient request ?- Continue Zofran prn ?- Continue p.o. intake as tolerated ?- Plan to treat with Reglan 5 mg TID during bowel preparation.  To start prior to starting bowel prep and continue through procedure time ?- Placed scopolamine patch 24 hours prior to starting bowel preparation ?- Discussed bowel preparation options today.  She would like to read more about and decide between Clenpiq and Sutab ?- Continue follow-up in the Hematology office as planned ? ?The indications, risks, and benefits of EGD and colonoscopy were explained to the patient in detail. Risks include but are not limited to bleeding, perforation, adverse reaction to medications, and cardiopulmonary compromise. Sequelae include but are not limited to the possibility of surgery, hospitalization, and mortality. The patient verbalized understanding and wished to proceed. All questions answered, referred to scheduler and bowel prep ordered. Further recommendations pending results of the exam.  ? ? ? ?Shellia Cleverly, DO, FACG  09/21/2021, 3:35  PM ? ? ?Erenest Blank, NP ? ?

## 2021-09-21 NOTE — Patient Instructions (Addendum)
If you are age 81 or older, your body mass index should be between 23-30. Your There is no height or weight on file to calculate BMI. If this is out of the aforementioned range listed, please consider follow up with your Primary Care Provider. ? ?__________________________________________________________ ? ?The [place] [facility] providers would like to encourage you to use [facility] to communicate with providers for non-urgent requests or questions.  Due to long hold times on the telephone, sending your provider a message by [facility] may be a faster and more efficient way to get a response.  Please allow 48 business hours for a response.  Please remember that this is for non-urgent requests.  ? ?Due to recent changes in healthcare laws, you may see the results of your imaging and laboratory studies on MyChart before your provider has had a chance to review them.  We understand that in some cases there may be results that are confusing or concerning to you. Not all laboratory results come back in the same time frame and the provider may be waiting for multiple results in order to interpret others.  Please give [place] 48 hours in order for your provider to thoroughly review all the results before contacting the office for clarification of your results.  ? ?It has been recommended to you by your physician that you have colonoscopy and endoscopy procedures completed at [facility]. Per your request, we did not schedule the procedure(s) today. Please contact our office at [phone] in July should you decide to have the procedure completed. ? ?We have sent the following medications to your pharmacy for you to pick up at your convenience: Clenpiq, reglan, scopolamine patch ? ? ?Thank you for choosing me and [facility]. ? ?Doristine Locks, D.O.  ? ?We want to thank you for trusting [facility] [place] [facility] with your care. All of our staff and providers value the relationships we have built with our  patients, and it is an honor to care for you.  ? ?We are writing to let you know that [facility] [place] will close on Sep 28, 2021, and we invite you to continue to see Dr. Edman Circle and Doristine Locks at the [facility] [place] office location. We are consolidating our serices at these [facility] practices to better provide care. Our office staff will work with you to ensure a seamless transition.  ? ?Doristine Locks, DO -Dr. Barron Alvine will be movig to [facility] at [address] [place], [place] [address], effective Sep 28, 2021.  Contact [phone] to schedule an appointment with him.  ? ?Edman Circle, MD- Dr. Chales Abrahams will be movig to [facility] at [address], [place], [place] [address], effective Sep 28, 2021.  Contact [phone] to schedule an appointment with him.  ? ?Requesting Medical Records ?If you need to request your medical records, please follow the instructions below. Your medical records are confidential, and a copy can be transferred to another provider or released to you or another person you designate only with your permission. ? ?There are several ways to request your medical records: ?Requests for medical records can be submitted through our practice.   ?You can also request your records electronically, in your MyChart account by selecting the ?Request Health Records? tab.  ?If you need additional information on how to request records, please go to [email or web address], choose Patient Information, then select Request Medical Records. ?To make an appointment or if you have any questions about your health  care needs, please contact our office at [phone] and one of our staff members will be glad to assist you. ?[facility] is committed to providing exceptional care for you and our community. Thank you for allowing [place] to serve your health care needs. ?Sincerely, ? ?Trixie Dredge, Director  Gastroenterology ?[facility]  also offers convenient virtual care options. Sore throat? Sinus problems? Cold or flu symptoms? Get care from the comfort of home with [facility] Video Visits and e-Visits. Learn more about the non-emergency conditions treated and start your virtual visit at [email or web address] ? ? ?

## 2021-09-23 ENCOUNTER — Encounter: Payer: Self-pay | Admitting: Family

## 2021-09-23 ENCOUNTER — Other Ambulatory Visit (HOSPITAL_BASED_OUTPATIENT_CLINIC_OR_DEPARTMENT_OTHER): Payer: Self-pay

## 2021-09-28 ENCOUNTER — Other Ambulatory Visit (HOSPITAL_BASED_OUTPATIENT_CLINIC_OR_DEPARTMENT_OTHER): Payer: Self-pay

## 2021-10-05 ENCOUNTER — Other Ambulatory Visit: Payer: Self-pay

## 2021-10-05 ENCOUNTER — Other Ambulatory Visit (HOSPITAL_BASED_OUTPATIENT_CLINIC_OR_DEPARTMENT_OTHER): Payer: Self-pay

## 2021-10-05 ENCOUNTER — Emergency Department (HOSPITAL_BASED_OUTPATIENT_CLINIC_OR_DEPARTMENT_OTHER): Payer: PPO

## 2021-10-05 ENCOUNTER — Emergency Department (HOSPITAL_BASED_OUTPATIENT_CLINIC_OR_DEPARTMENT_OTHER)
Admission: EM | Admit: 2021-10-05 | Discharge: 2021-10-05 | Disposition: A | Discharge status: Home / Self Care | Payer: PPO | Attending: Emergency Medicine | Admitting: Emergency Medicine

## 2021-10-05 ENCOUNTER — Encounter (HOSPITAL_BASED_OUTPATIENT_CLINIC_OR_DEPARTMENT_OTHER): Payer: Self-pay | Admitting: Emergency Medicine

## 2021-10-05 DIAGNOSIS — E039 Hypothyroidism, unspecified: Secondary | ICD-10-CM | POA: Insufficient documentation

## 2021-10-05 DIAGNOSIS — Z20822 Contact with and (suspected) exposure to covid-19: Secondary | ICD-10-CM | POA: Diagnosis not present

## 2021-10-05 DIAGNOSIS — E876 Hypokalemia: Secondary | ICD-10-CM | POA: Insufficient documentation

## 2021-10-05 DIAGNOSIS — I4891 Unspecified atrial fibrillation: Secondary | ICD-10-CM | POA: Insufficient documentation

## 2021-10-05 DIAGNOSIS — Z79899 Other long term (current) drug therapy: Secondary | ICD-10-CM | POA: Diagnosis not present

## 2021-10-05 DIAGNOSIS — R000 Tachycardia, unspecified: Secondary | ICD-10-CM | POA: Diagnosis present

## 2021-10-05 DIAGNOSIS — N189 Chronic kidney disease, unspecified: Secondary | ICD-10-CM | POA: Diagnosis not present

## 2021-10-05 DIAGNOSIS — J189 Pneumonia, unspecified organism: Secondary | ICD-10-CM | POA: Insufficient documentation

## 2021-10-05 LAB — RESP PANEL BY RT-PCR (FLU A&B, COVID) ARPGX2
Influenza A by PCR: NEGATIVE
Influenza B by PCR: NEGATIVE
SARS Coronavirus 2 by RT PCR: NEGATIVE

## 2021-10-05 LAB — BASIC METABOLIC PANEL
Anion gap: 10 (ref 5–15)
BUN: 7 mg/dL — ABNORMAL LOW (ref 8–23)
CO2: 32 mmol/L (ref 22–32)
Calcium: 8.1 mg/dL — ABNORMAL LOW (ref 8.9–10.3)
Chloride: 96 mmol/L — ABNORMAL LOW (ref 98–111)
Creatinine, Ser: 1.13 mg/dL — ABNORMAL HIGH (ref 0.44–1.00)
GFR, Estimated: 49 mL/min — ABNORMAL LOW (ref >60)
Glucose, Bld: 103 mg/dL — ABNORMAL HIGH (ref 70–99)
Potassium: 2.8 mmol/L — ABNORMAL LOW (ref 3.5–5.1)
Sodium: 138 mmol/L (ref 135–145)

## 2021-10-05 LAB — CBC
HCT: 39.6 % (ref 36.0–46.0)
Hemoglobin: 12.4 g/dL (ref 12.0–15.0)
MCH: 27.7 pg (ref 26.0–34.0)
MCHC: 31.3 g/dL (ref 30.0–36.0)
MCV: 88.4 fL (ref 80.0–100.0)
Platelets: 332 10*3/uL (ref 150–400)
RBC: 4.48 MIL/uL (ref 3.87–5.11)
RDW: 21.7 % — ABNORMAL HIGH (ref 11.5–15.5)
WBC: 9.5 10*3/uL (ref 4.0–10.5)
nRBC: 0 % (ref 0.0–0.2)

## 2021-10-05 LAB — MAGNESIUM: Magnesium: 1.9 mg/dL (ref 1.7–2.4)

## 2021-10-05 LAB — TROPONIN I (HIGH SENSITIVITY)
Troponin I (High Sensitivity): 12 ng/L (ref <18)
Troponin I (High Sensitivity): 9 ng/L (ref <18)

## 2021-10-05 LAB — PROTIME-INR
INR: 1 (ref 0.8–1.2)
Prothrombin Time: 12.6 seconds (ref 11.4–15.2)

## 2021-10-05 MED ORDER — ONDANSETRON HCL 4 MG/2ML IJ SOLN
4.0000 mg | Freq: Once | INTRAMUSCULAR | Status: AC
  Administered 2021-10-05: 4 mg via INTRAVENOUS
  Filled 2021-10-05: qty 2

## 2021-10-05 MED ORDER — POTASSIUM CHLORIDE 10 MEQ/100ML IV SOLN
10.0000 meq | Freq: Once | INTRAVENOUS | Status: AC
Start: 2021-10-05 — End: 2021-10-05
  Administered 2021-10-05: 10 meq via INTRAVENOUS
  Filled 2021-10-05: qty 100

## 2021-10-05 MED ORDER — DOXYCYCLINE HYCLATE 100 MG PO CAPS
100.0000 mg | ORAL_CAPSULE | Freq: Two times a day (BID) | ORAL | 0 refills | Status: DC
  Filled 2021-10-05: qty 14, 7d supply, fill #0

## 2021-10-05 MED ORDER — ACETAMINOPHEN 325 MG PO TABS
650.0000 mg | ORAL_TABLET | Freq: Once | ORAL | Status: AC
  Administered 2021-10-05: 650 mg via ORAL
  Filled 2021-10-05: qty 2

## 2021-10-05 MED ORDER — SODIUM CHLORIDE 0.9 % IV SOLN
500.0000 mg | INTRAVENOUS | Status: DC
  Administered 2021-10-05: 500 mg via INTRAVENOUS
  Filled 2021-10-05 (×2): qty 5

## 2021-10-05 MED ORDER — POTASSIUM CHLORIDE ER 10 MEQ PO TBCR
10.0000 meq | EXTENDED_RELEASE_TABLET | Freq: Every day | ORAL | 0 refills | Status: DC
  Filled 2021-10-05: qty 4, 4d supply, fill #0

## 2021-10-05 MED ORDER — POTASSIUM CHLORIDE CRYS ER 20 MEQ PO TBCR
40.0000 meq | EXTENDED_RELEASE_TABLET | Freq: Once | ORAL | Status: AC
  Administered 2021-10-05: 40 meq via ORAL
  Filled 2021-10-05: qty 2

## 2021-10-05 MED ORDER — CEFTRIAXONE SODIUM 1 G IJ SOLR
1.0000 g | Freq: Once | INTRAMUSCULAR | Status: AC
Start: 2021-10-05 — End: 2021-10-05
  Administered 2021-10-05: 1 g via INTRAVENOUS
  Filled 2021-10-05: qty 10

## 2021-10-05 MED ORDER — METOPROLOL TARTRATE 25 MG PO TABS
12.5000 mg | ORAL_TABLET | Freq: Once | ORAL | Status: AC
  Administered 2021-10-05: 12.5 mg via ORAL
  Filled 2021-10-05: qty 1

## 2021-10-05 MED ORDER — POTASSIUM CHLORIDE ER 10 MEQ PO TBCR
20.0000 meq | EXTENDED_RELEASE_TABLET | Freq: Every day | ORAL | 0 refills | Status: DC
  Filled 2021-10-05 – 2021-10-08 (×3): qty 60, 30d supply, fill #0

## 2021-10-05 MED ORDER — IOHEXOL 350 MG/ML SOLN
80.0000 mL | Freq: Once | INTRAVENOUS | Status: AC | PRN
Start: 2021-10-05 — End: 2021-10-05
  Administered 2021-10-05: 80 mL via INTRAVENOUS

## 2021-10-05 MED ORDER — LEVALBUTEROL HCL 1.25 MG/0.5ML IN NEBU
1.2500 mg | INHALATION_SOLUTION | Freq: Once | RESPIRATORY_TRACT | Status: AC
  Administered 2021-10-05: 1.25 mg via RESPIRATORY_TRACT
  Filled 2021-10-05: qty 0.5

## 2021-10-05 MED ORDER — METOPROLOL TARTRATE 25 MG PO TABS
12.5000 mg | ORAL_TABLET | Freq: Two times a day (BID) | ORAL | 0 refills | Status: DC
  Filled 2021-10-05: qty 60, 60d supply, fill #0

## 2021-10-05 NOTE — ED Provider Notes (Signed)
[facility] Provider Note   CSN: [id number] Arrival date & time: 10/05/21  1631     History  Chief Complaint  Patient presents with   Atrial Fibrillation    Mallory Durham is a 81 y.o. female.  Pt is a 81 yo female with a pmhx significant for migraines, hyperlipidemia, depression, hypothyroidism, ckd, anxiety, and orthostatic hypotension.  Pt went to UC today for sob.  She has been using her albuterol inhaler more than usual.  While at [facility], she was found to be in new onset afib with RVR.  Pt can't tell that her heart is irregular and does not feel that her heart is beating fast.       Home Medications Prior to Admission medications   Medication Sig Start Date End Date Taking? Authorizing Provider  doxycycline (VIBRAMYCIN) 100 MG capsule Take 1 capsule (100 mg total) by mouth 2 (two) times daily. 10/05/21  Yes Isla Pence, MD  metoprolol tartrate (LOPRESSOR) 25 MG tablet Take 0.5 tablets (12.5 mg total) by mouth 2 (two) times daily. 10/05/21  Yes Isla Pence, MD  potassium chloride (KLOR-CON) 10 MEQ tablet Take 2 tablets (20 mEq total) by mouth daily. 10/05/21  Yes Isla Pence, MD  aspirin-acetaminophen-caffeine (EXCEDRIN MIGRAINE) [phone] MG tablet Take by mouth every 6 (six) hours as needed for headache.    [provider]  botulinum toxin Type A (BOTOX) 100 units SOLR injection Inject IM every 3 months by MD in the office 12/07/17   Marcial Pacas, MD  Cholecalciferol (D3-1000 PO) Take 1 tablet by mouth daily.    [provider]  diphenhydrAMINE (BENADRYL) 25 mg capsule Take 25 mg by mouth every 6 (six) hours as needed.    [provider]  fludrocortisone (FLORINEF) 0.1 MG tablet Take 1 tablet (0.1 mg total) by mouth 3 (three) times daily. 06/30/21 06/30/22  Marcial Pacas, MD  gabapentin (NEURONTIN) 100 MG capsule TAKE 3 CAPSULES (300 MG TOTAL) BY MOUTH 3 (THREE) TIMES DAILY. 11/19/20 11/19/21  Marcial Pacas, MD  metoCLOPramide  (REGLAN) 5 MG tablet Take 1 tablet (5 mg total) by mouth 3 (three) times daily. Start taking prior to prep. 09/21/21   Cirigliano, Vito V, DO  midodrine (PROAMATINE) 10 MG tablet Take 1 tablet (10 mg total) by mouth 3 (three) times daily. 01/16/21   Marcial Pacas, MD  Multiple Vitamins-Minerals (PRESERVISION AREDS 2) CAPS Take 2 capsules by mouth.    [provider]  ondansetron (ZOFRAN-ODT) 8 MG disintegrating tablet Take 1 tablet (8 mg total) by mouth every 8 (eight) hours as needed for nausea or vomiting. 07/22/21   Marcial Pacas, MD  pravastatin (PRAVACHOL) 20 MG tablet Take 1 tablet (20 mg total) by mouth nightly. 06/19/21     Probiotic Product (PROBIOTIC PO) Take 1 capsule by mouth as directed.    [provider]  pyridostigmine (MESTINON) 60 MG tablet Take 1 tablet (60 mg total) by mouth 3 (three) times daily as needed. 07/22/21   Marcial Pacas, MD  sertraline (ZOLOFT) 100 MG tablet Take 1 tablet by mouth in the morning 06/19/21     Sod Picosulfate-Mag Ox-Cit Acd (CLENPIQ) 10-3.5-12 MG-GM -GM/160ML SOLN Take 1 kit by mouth as directed. 09/21/21   Cirigliano, Vito V, DO  sodium fluoride (SF 5000 PLUS) 1.1 % CREA dental cream Brush teeth 3 to 5 minutes at bedtime 06/01/21     SUMAtriptan (IMITREX) 100 MG tablet Take 1 tab at onset of migraine.  May repeat in 2 hrs,  if needed.  Max dose: 2 tabs/day. This is a 30 day prescription. 07/22/21   Marcial Pacas, MD  SYNTHROID 88 MCG tablet Take 1 tablet (88 mcg total) by mouth daily before breakfast. 07/07/21   Philemon Kingdom, MD  tiZANidine (ZANAFLEX) 2 MG tablet TAKE 1 TABLET BY MOUTH EVERY 8 HOURS AS NEEDED FOR MUSCLE SPASMS 07/02/21 07/02/22  Marcial Pacas, MD  vitamin B-12 (CYANOCOBALAMIN) 1000 MCG tablet Take 1,000 mcg by mouth daily.    [provider]  zolpidem (AMBIEN) 10 MG tablet Take 1 tablet (10 mg total) by mouth at bedtime as needed for sleep. 08/26/21 02/22/22  Genia Harold, MD  Zoster Vaccine Adjuvanted [facility]) injection Inject into the  muscle. Patient not taking: Reported on 09/08/2021 08/04/21   Carlyle Basques, MD  buPROPion (WELLBUTRIN XL) 150 MG 24 hr tablet Take 1 tablet (150 mg total) by mouth every morning. 06/19/21 07/31/21        Allergies    Patient has no known allergies.    Review of Systems   Review of Systems  Respiratory:  Positive for cough, shortness of breath and wheezing.   All other systems reviewed and are negative.  Physical Exam Updated Vital Signs BP (!) 157/111   Pulse (!) 113   Temp 99.9 F (37.7 C) (Oral)   Resp 17   Ht 5' 8"  (1.727 m)   Wt 89.8 kg   SpO2 93%   BMI 30.11 kg/m  Physical Exam Vitals and nursing note reviewed.  Constitutional:      Appearance: Normal appearance.  HENT:     Head: Normocephalic and atraumatic.     Right Ear: External ear normal.     Left Ear: External ear normal.     Nose: Nose normal.     Mouth/Throat:     Mouth: Mucous membranes are moist.     Pharynx: Oropharynx is clear.  Eyes:     Extraocular Movements: Extraocular movements intact.     Conjunctiva/sclera: Conjunctivae normal.     Pupils: Pupils are equal, round, and reactive to light.  Cardiovascular:     Rate and Rhythm: Tachycardia present. Rhythm irregular.     Pulses: Normal pulses.     Heart sounds: Normal heart sounds.  Pulmonary:     Breath sounds: Normal breath sounds.  Abdominal:     General: Abdomen is flat. Bowel sounds are normal.     Palpations: Abdomen is soft.  Musculoskeletal:        General: Normal range of motion.     Cervical back: Normal range of motion and neck supple.  Skin:    General: Skin is warm.     Capillary Refill: Capillary refill takes less than 2 seconds.  Neurological:     General: No focal deficit present.     Mental Status: She is alert and oriented to person, place, and time.  Psychiatric:        Mood and Affect: Mood normal.        Behavior: Behavior normal.    ED Results / Procedures / Treatments   Labs (all labs ordered are listed, but  only abnormal results are displayed) Labs Reviewed  BASIC METABOLIC PANEL - Abnormal; Notable for the following components:      Result Value   Potassium 2.8 (*)    Chloride 96 (*)    Glucose, Bld 103 (*)    BUN 7 (*)    Creatinine, Ser 1.13 (*)    Calcium 8.1 (*)  GFR, Estimated 49 (*)    All other components within normal limits  CBC - Abnormal; Notable for the following components:   RDW 21.7 (*)    All other components within normal limits  RESP PANEL BY RT-PCR (FLU A&B, COVID) ARPGX2  MAGNESIUM  PROTIME-INR  TSH  TROPONIN I (HIGH SENSITIVITY)  TROPONIN I (HIGH SENSITIVITY)    EKG EKG Interpretation  Date/Time:  Monday Oct 05 2021 16:43:38 EDT Ventricular Rate:  122 PR Interval:    QRS Duration: 76 QT Interval:  325 QTC Calculation: 463 R Axis:   12 Text Interpretation: Atrial fibrillation Abnormal R-wave progression, early transition Nonspecific repol abnormality, diffuse leads No old tracing to compare Confirmed by Isla Pence [phone]) on 10/05/2021 5:06:35 PM  Radiology CT Angio Chest PE W and/or Wo Contrast  Result Date: 10/05/2021 CLINICAL DATA:  Chest congestion, wheezing, atrial fibrillation EXAM: CT ANGIOGRAPHY CHEST WITH CONTRAST TECHNIQUE: Multidetector CT imaging of the chest was performed using the standard protocol during bolus administration of intravenous contrast. Multiplanar CT image reconstructions and MIPs were obtained to evaluate the vascular anatomy. RADIATION DOSE REDUCTION: This exam was performed according to the departmental dose-optimization program which includes automated exposure control, adjustment of the mA and/or kV according to patient size and/or use of iterative reconstruction technique. CONTRAST:  28m OMNIPAQUE IOHEXOL 350 MG/ML SOLN COMPARISON:  Previous studies including chest radiograph done today FINDINGS: Cardiovascular: There is homogeneous enhancement in the thoracic aorta. 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 There are no intraluminal filling defects in the central pulmonary artery branches. Evaluation of small peripheral branches is limited by motion artifacts and infiltrates. Mediastinum/Nodes: No significant lymphadenopathy is seen in the mediastinum. Thyroid is not seen in the lower neck. Lungs/Pleura: Small linear densities seen in both lower lung fields. There are scattered foci of ground-glass densities in both lungs. There is minimal bilateral pleural effusions, more so on the right side. There is no pneumothorax. Upper Abdomen: Surgical clips are seen in gallbladder fossa. There is moderate to large fixed hiatal hernia. There is 18 x 10 mm smooth marginated fluid density structure in the retrocrural region. Possibility of necrotic lymph node is not excluded. Musculoskeletal: Unremarkable. Review of the MIP images confirms the above findings. IMPRESSION: There is no evidence of central pulmonary artery embolism. There is no evidence of thoracic aortic dissection. 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 Recommend annual imaging followup by CTA or MRA. This recommendation follows 2010 ACCF/AHA/AATS/ACR/ASA/SCA/SCAI/SIR/STS/SVM Guidelines for the Diagnosis and Management of Patients with Thoracic Aortic Disease. Circulation. 2010; 121:: C166-A630 Aortic aneurysm NOS (ICD10-I71.9) There are patchy ground-glass densities in both lungs. This may suggest scarring or mild pulmonary edema or interstitial pneumonia. Small linear densities in both lower lung fields may suggest scarring or subsegmental atelectasis. There is 18 x 10 mm smooth marginated low-density nodule in the retrocrural space. Findings suggest possible enlarged lymph node with central necrosis. Follow-up CT in 3 months may be considered to re-evaluate this finding. There is moderate to large fixed hiatal hernia. Electronically Signed   By: PElmer PickerM.D.   On: 10/05/2021 18:04   DG Chest Port 1  View  Result Date: 10/05/2021 CLINICAL DATA:  Shortness of breath EXAM: PORTABLE CHEST 1 VIEW COMPARISON:  Radiograph 09/08/2021 FINDINGS: Unchanged enlarged cardiac silhouette. Hiatal hernia. No new airspace disease. No pleural effusion. No pneumothorax. No acute osseous abnormality. Thoracic spondylosis. IMPRESSION: No evidence of acute cardiopulmonary disease. Hiatal hernia. Electronically Signed  By: Maurine Simmering M.D.   On: 10/05/2021 17:01    Procedures Procedures    Medications Ordered in ED Medications  potassium chloride 10 mEq in 100 mL IVPB (10 mEq Intravenous New Bag/Given 10/05/21 1828)  cefTRIAXone (ROCEPHIN) 1 g in sodium chloride 0.9 % 100 mL IVPB (has no administration in time range)  azithromycin (ZITHROMAX) 500 mg in sodium chloride 0.9 % 250 mL IVPB (has no administration in time range)  acetaminophen (TYLENOL) tablet 650 mg (has no administration in time range)  levalbuterol (XOPENEX) nebulizer solution 1.25 mg (1.25 mg Nebulization Given 10/05/21 1741)  potassium chloride SA (KLOR-CON M) CR tablet 40 mEq (40 mEq Oral Rx Charged 10/05/21 1754)  iohexol (OMNIPAQUE) 350 MG/ML injection 80 mL (80 mLs Intravenous Contrast Given 10/05/21 1727)  metoprolol tartrate (LOPRESSOR) tablet 12.5 mg (12.5 mg Oral Given 10/05/21 1846)    ED Course/ Medical Decision Making/ A&P                           Medical Decision Making Amount and/or Complexity of Data Reviewed Labs: ordered. Radiology: ordered.  Risk Prescription drug management.   This patient presents to the ED for concern of sob, this involves an extensive number of treatment options, and is a complaint that carries with it a high risk of complications and morbidity.  The differential diagnosis includes pna, bronchitis, RAD, PE, afib   Co morbidities that complicate the patient evaluation  migraines, hyperlipidemia, depression, hypothyroidism, ckd, anxiety, and orthostatic hypotension   Additional history  obtained:  Additional history obtained from epic chart review External records from outside source obtained and reviewed including daughter   Lab Tests:  I Ordered, and personally interpreted labs.  The pertinent results include:  covid neg, bmp with K of 2.8; cbc nl; trop nl at 12   Imaging Studies ordered:  I ordered imaging studies including ct chest  I independently visualized and interpreted imaging which showed    IMPRESSION:  There is no evidence of central pulmonary artery embolism. There is  no evidence of thoracic aortic dissection.     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  Recommend annual imaging followup by CTA or MRA. This recommendation  follows 2010 ACCF/AHA/AATS/ACR/ASA/SCA/SCAI/SIR/STS/SVM Guidelines  for the Diagnosis and Management of Patients with Thoracic Aortic  Disease. Circulation. 2010; 121: O756-E332. Aortic aneurysm NOS  (ICD10-I71.9)     There are patchy ground-glass densities in both lungs. This may  suggest scarring or mild pulmonary edema or interstitial pneumonia.  Small linear densities in both lower lung fields may suggest  scarring or subsegmental atelectasis.     There is 18 x 10 mm smooth marginated low-density nodule in the  retrocrural space. Findings suggest possible enlarged lymph node  with central necrosis. Follow-up CT in 3 months may be considered to  re-evaluate this finding.     There is moderate to large fixed hiatal hernia.         I agree with the radiologist interpretation   Cardiac Monitoring:  The patient was maintained on a cardiac monitor.  I personally viewed and interpreted the cardiac monitored which showed an underlying rhythm of: afib with rvr   Medicines ordered and prescription drug management:  I ordered medication including rocephin/zithromax  for pna ; kdur and kcl for hypokalemia; xopenex for wheezing; metoprolol for hr Reevaluation of the patient after these medicines  showed that the patient improved I have reviewed the  patients home medicines and have made adjustments as needed   Test Considered:  ct   Critical Interventions:  meds    Problem List / ED Course:  Afib with RVR:  pt is unable to tell she is in afib.  Therefore, she is not a candidate for ED cardioversion.  Pt has had GI bleeds and is a high risk for falling due to autonomic dysfunction.  I had a discussion with pt and her daughter who is a Marine scientist.  We agreed to hold off on blood thinners.  They know the risks of possible stroke.  Pt has problems with orthostatic hypotension and takes florinef and midodrine.  Pt was on propranol in the past for migraines and was taken off due to constipation.  Daughter wants to start a low dose of metoprolol for rate control.  She is an established pt with Dr. Caryl Comes and daughter will have her f/u with him to determine the optimal medicine. CAP:  pt given a dose of iv rocephin and zithromax in the ED.  Pt unable to ambulate due to orthostatic hypotension, but her RA O2 sat is in the upper 90s.  Pt d/c with doxy. Hypokalemia:  pt given iv kcl and oral kdur.  Pt d/c with kdur. I offered admission to try to deal with these multiple problems, but the daughter wants to try to take her home.  She knows to return if pt's sx worsen.   Reevaluation:  After the interventions noted above, I reevaluated the patient and found that they have :improved   Social Determinants of Health:  Lives at home with her daughter   Dispostion:  After consideration of the diagnostic results and the patients response to treatment, I feel that the patent would benefit from admission, but daughter wants to take her home with close outpatient f/u.  Daughter is a Marine scientist and has a  pulse ox and bp/hr monitor at home.  Daughter will bring her back if worse.  CHA2DS2/VAS Stroke Risk Points: 3              Final Clinical Impression(s) / ED Diagnoses Final diagnoses:   Community acquired pneumonia, unspecified laterality  Atrial fibrillation with RVR ([place])  Hypokalemia    Rx / DC Orders ED Discharge Orders          Ordered    Amb referral to AFIB Clinic        10/05/21 1649    potassium chloride (KLOR-CON) 10 MEQ tablet  Daily,   Status:  Discontinued        10/05/21 1743    doxycycline (VIBRAMYCIN) 100 MG capsule  2 times daily        10/05/21 1842    metoprolol tartrate (LOPRESSOR) 25 MG tablet  2 times daily        10/05/21 1842    potassium chloride (KLOR-CON) 10 MEQ tablet  Daily        10/05/21 1843              Isla Pence, MD 10/05/21 [phone]

## 2021-10-05 NOTE — ED Notes (Signed)
RT assessed patient upon arrival to room. Stated that she has been wheezing more today and using her rescue inhaler. HR increased. BBS wheezes not no increased respirations at rest. RT will monitor as needed

## 2021-10-05 NOTE — ED Triage Notes (Signed)
Was seen at PCP for chest congestion, wheezing , new onset Afib. Denies chest pain nor shortness of breath.

## 2021-10-05 NOTE — ED Notes (Signed)
ED Provider at bedside. 

## 2021-10-05 NOTE — Discharge Instructions (Addendum)
You have an abnormal looking lymph node in your chest.  This is likely from the pneumonia.  However, you need a repeat CT of your chest in about 3 months to make sure it has gotten better.

## 2021-10-06 ENCOUNTER — Other Ambulatory Visit (HOSPITAL_BASED_OUTPATIENT_CLINIC_OR_DEPARTMENT_OTHER): Payer: Self-pay

## 2021-10-06 LAB — TSH: TSH: 3.516 u[IU]/mL (ref 0.350–4.500)

## 2021-10-07 ENCOUNTER — Other Ambulatory Visit (HOSPITAL_BASED_OUTPATIENT_CLINIC_OR_DEPARTMENT_OTHER): Payer: Self-pay

## 2021-10-08 ENCOUNTER — Encounter (HOSPITAL_COMMUNITY): Payer: Self-pay | Admitting: Physician Assistant

## 2021-10-08 ENCOUNTER — Ambulatory Visit (HOSPITAL_COMMUNITY)
Admission: RE | Admit: 2021-10-08 | Discharge: 2021-10-08 | Disposition: A | Discharge status: Home / Self Care | Payer: PPO | Source: Ambulatory Visit | Attending: Physician Assistant | Admitting: Physician Assistant

## 2021-10-08 ENCOUNTER — Other Ambulatory Visit (HOSPITAL_BASED_OUTPATIENT_CLINIC_OR_DEPARTMENT_OTHER): Payer: Self-pay

## 2021-10-08 VITALS — BP 142/100 | HR 106 | Ht 68.0 in | Wt 202.2 lb

## 2021-10-08 DIAGNOSIS — I4819 Other persistent atrial fibrillation: Secondary | ICD-10-CM | POA: Insufficient documentation

## 2021-10-08 DIAGNOSIS — Z8701 Personal history of pneumonia (recurrent): Secondary | ICD-10-CM | POA: Diagnosis not present

## 2021-10-08 DIAGNOSIS — E785 Hyperlipidemia, unspecified: Secondary | ICD-10-CM | POA: Diagnosis not present

## 2021-10-08 DIAGNOSIS — E669 Obesity, unspecified: Secondary | ICD-10-CM | POA: Diagnosis not present

## 2021-10-08 DIAGNOSIS — N189 Chronic kidney disease, unspecified: Secondary | ICD-10-CM | POA: Diagnosis not present

## 2021-10-08 DIAGNOSIS — E039 Hypothyroidism, unspecified: Secondary | ICD-10-CM | POA: Diagnosis not present

## 2021-10-08 DIAGNOSIS — G901 Familial dysautonomia [Riley-Day]: Secondary | ICD-10-CM | POA: Insufficient documentation

## 2021-10-08 DIAGNOSIS — Z6830 Body mass index (BMI) 30.0-30.9, adult: Secondary | ICD-10-CM | POA: Diagnosis not present

## 2021-10-08 DIAGNOSIS — D6869 Other thrombophilia: Secondary | ICD-10-CM | POA: Diagnosis not present

## 2021-10-08 DIAGNOSIS — I951 Orthostatic hypotension: Secondary | ICD-10-CM | POA: Diagnosis not present

## 2021-10-08 MED ORDER — METOPROLOL SUCCINATE ER 25 MG PO TB24
25.0000 mg | ORAL_TABLET | Freq: Every day | ORAL | 3 refills | Status: DC
  Filled 2021-10-08: qty 30, 30d supply, fill #0
  Filled 2021-11-02: qty 30, 30d supply, fill #1

## 2021-10-08 NOTE — Progress Notes (Signed)
Primary Care Physician: Lorenda Hatchet, [place] Primary Cardiologist: Dr Acie Fredrickson Primary Electrophysiologist: Dr Caryl Comes Referring Physician: [facility]   Mallory Durham is a 81 y.o. female with a history of dysautonomia, HLD, hypothyroidism, CKD, atrial fibrillation who presents for follow up in the [facility].  The patient was initially diagnosed with atrial fibrillation 10/05/21 after presenting to urgent care with symptoms of SOB. She was found to be in afib with RVR in the setting of community acquired pneumonia and sent to ED. Patient was not started on anticoagulation for a CHADS2VASC score of 3 due to her h/o GI bleeding and high fall risk with autonomic dysfunction. She was started on low dose of metoprolol for rate control.   On follow up today, patient reports that she feels very fatigued. Otherwise, she cannot tell her heart is out of rhythm. She had a positive hemoccult in April and has had a visit with GI to discuss colonoscopy. Her daughter who accompanies her reports that her dysautonomia has progressed recently.   Today, she denies symptoms of palpitations, chest pain, shortness of breath, orthopnea, PND, lower extremity edema, presyncope, syncope, snoring, daytime somnolence.   Atrial Fibrillation Risk Factors:  she does not have symptoms or diagnosis of sleep apnea. she does not have a history of rheumatic fever.   she has a BMI of Body mass index is 30.74 kg/m.Marland Kitchen Filed Weights   10/08/21 1355  Weight: 91.7 kg    Family History  Problem Relation Age of Onset   Heart disease Mother        Angina   Cancer Mother        Breast   Cancer Father        Prostate   Heart disease Father    Cancer Maternal Aunt    Colon cancer Neg Hx    Esophageal cancer Neg Hx      Atrial Fibrillation Management history:  Previous antiarrhythmic drugs: none Previous cardioversions: none Previous ablations: none CHADS2VASC score:  3 Anticoagulation history: none   Past Medical History:  Diagnosis Date   2019 novel coronavirus disease (COVID-19)    Anxiety    Confusion, hx of, without neuro findings    Depression    Hx of vertigo    Hyperlipidemia    Hypothyroid    Left shoulder pain    Migraines    Neck pain    Orthostatic hypotension    Renal insufficiency    Past Surgical History:  Procedure Laterality Date   ABDOMINAL HYSTERECTOMY     ABDOMINAL HYSTERECTOMY     BREAST LUMPECTOMY     CHOLECYSTECTOMY     COLONOSCOPY     Done in [facility] about 15 years ago   JOINT REPLACEMENT     THYROID SURGERY     TONSILLECTOMY      Current Outpatient Medications  Medication Sig Dispense Refill   aspirin-acetaminophen-caffeine (EXCEDRIN MIGRAINE) 250-250-65 MG tablet Take by mouth every 6 (six) hours as needed for headache.     botulinum toxin Type A (BOTOX) 100 units SOLR injection Inject IM every 3 months by MD in the office 2 vial 3   buPROPion (WELLBUTRIN XL) 150 MG 24 hr tablet Take 1 tablet by mouth every morning.     Cholecalciferol (D3-1000 PO) Take 1 tablet by mouth daily.     diphenhydrAMINE (BENADRYL) 25 mg capsule Take 25 mg by mouth every 6 (six) hours as needed.  doxycycline (VIBRAMYCIN) 100 MG capsule Take 1 capsule (100 mg total) by mouth 2 (two) times daily. 14 capsule 0   fludrocortisone (FLORINEF) 0.1 MG tablet Take 1 tablet (0.1 mg total) by mouth 3 (three) times daily. 90 tablet 3   gabapentin (NEURONTIN) 300 MG capsule Take by mouth.     metoCLOPramide (REGLAN) 5 MG tablet Take 1 tablet (5 mg total) by mouth 3 (three) times daily. Start taking prior to prep. 6 tablet 0   metoprolol tartrate (LOPRESSOR) 25 MG tablet Take 1/2  tablet (12.5 mg total) by mouth 2 (two) times daily. 60 tablet 0   midodrine (PROAMATINE) 10 MG tablet Take 1 tablet (10 mg total) by mouth 3 (three) times daily. 270 tablet 3   Multiple Vitamins-Minerals (PRESERVISION AREDS 2) CAPS Take 2 capsules by  mouth.     ondansetron (ZOFRAN-ODT) 8 MG disintegrating tablet Take 1 tablet (8 mg total) by mouth every 8 (eight) hours as needed for nausea or vomiting. 30 tablet 5   potassium chloride (KLOR-CON) 10 MEQ tablet Take 2 tablets (20 mEq total) by mouth daily. 60 tablet 0   pravastatin (PRAVACHOL) 20 MG tablet Take 1 tablet (20 mg total) by mouth nightly. 30 tablet 5   Probiotic Product (PROBIOTIC PO) Take 1 capsule by mouth as directed.     pyridostigmine (MESTINON) 60 MG tablet Take 1 tablet (60 mg total) by mouth 3 (three) times daily as needed. 90 tablet 3   sertraline (ZOLOFT) 100 MG tablet Take 1 tablet by mouth in the morning 30 tablet 5   Sod Picosulfate-Mag Ox-Cit Acd (CLENPIQ) 10-3.5-12 MG-GM -GM/160ML SOLN Take 1 kit by mouth as directed. 320 mL 0   sodium fluoride (SF 5000 PLUS) 1.1 % CREA dental cream Brush teeth 3 to 5 minutes at bedtime 51 g 0   SUMAtriptan (IMITREX) 100 MG tablet Take 1 tab at onset of migraine.  May repeat in 2 hrs, if needed.  Max dose: 2 tabs/day. This is a 30 day prescription. 9 tablet 11   SYNTHROID 88 MCG tablet Take 1 tablet (88 mcg total) by mouth daily before breakfast. 90 tablet 3   tiZANidine (ZANAFLEX) 2 MG tablet TAKE 1 TABLET BY MOUTH EVERY 8 HOURS AS NEEDED FOR MUSCLE SPASMS 30 tablet 1   vitamin B-12 (CYANOCOBALAMIN) 1000 MCG tablet Take 1,000 mcg by mouth daily.     zolpidem (AMBIEN) 10 MG tablet Take 1 tablet (10 mg total) by mouth at bedtime as needed for sleep. 30 tablet 5   Zoster Vaccine Adjuvanted [facility]) injection Inject into the muscle. 1 each 1   No current facility-administered medications for this encounter.    No Known Allergies  Social History   Socioeconomic History   Marital status: Married    Spouse name: Not on file   Number of children: 2   Years of education: 16   Highest education level: Bachelor's degree (e.g., BA, AB, BS)  Occupational History    Employer: RETIRED  Tobacco Use   Smoking status: Never   Smokeless  tobacco: Never   Tobacco comments:    Never smoke 10/08/21  Vaping Use   Vaping Use: Never used  Substance and Sexual Activity   Alcohol use: No   Drug use: No   Sexual activity: Not on file  Other Topics Concern   Not on file  Social History Narrative   Lives at home alone.   Right-handed.   1-2 cups caffeine per day.   Social Determinants of  Health   Financial Resource Strain: Not on file  Food Insecurity: Not on file  Transportation Needs: Not on file  Physical Activity: Not on file  Stress: Not on file  Social Connections: Not on file  Intimate Partner Violence: Not on file     ROS- All systems are reviewed and negative except as per the HPI above.  Physical Exam: Vitals:   10/08/21 1355  BP: (!) 142/100  Pulse: (!) 106  Weight: 91.7 kg  Height: $Remove'5\' 8"'UWOnfMY$  (1.727 m)    GEN- The patient is a well appearing elderly female, alert and oriented x 3 today.   Head- normocephalic, atraumatic Eyes-  Sclera clear, conjunctiva pink Ears- hearing intact Oropharynx- clear Neck- supple  Lungs- Clear to ausculation bilaterally, normal work of breathing Heart- irregular rate and rhythm, no murmurs, rubs or gallops  GI- soft, NT, ND, + BS Extremities- no clubbing, cyanosis, or edema MS- no significant deformity or atrophy Skin- no rash or lesion Psych- euthymic mood, full affect Neuro- strength and sensation are intact  Wt Readings from Last 3 Encounters:  10/08/21 91.7 kg  10/05/21 89.8 kg  09/21/21 89.8 kg    EKG today demonstrates  Afib Vent. rate 106 BPM PR interval * ms QRS duration 84 ms QT/QTcB 412/547 ms  Echo 08/04/21 demonstrated  1. Left ventricular ejection fraction, by estimation, is 60 to 65%. The  left ventricle has normal function. The left ventricle has no regional  wall motion abnormalities. Left ventricular diastolic parameters were  normal.   2. Right ventricular systolic function is normal. The right ventricular  size is normal. There is  normal pulmonary artery systolic pressure.   3. Right atrial size was mildly dilated.   4. The mitral valve is normal in structure. No evidence of mitral valve  regurgitation. No evidence of mitral stenosis.   5. The aortic valve is normal in structure. Aortic valve regurgitation is  mild. No aortic stenosis is present.   6. There is mild dilatation of the ascending aorta, measuring 39 mm.   7. The inferior vena cava is normal in size with greater than 50%  respiratory variability, suggesting right atrial pressure of 3 mmHg.   Epic records are reviewed at length today  CHA2DS2-VASc Score = 3  The patient's score is based upon: CHF History: 0 HTN History: 0 Diabetes History: 0 Stroke History: 0 Vascular Disease History: 0 Age Score: 2 Gender Score: 1       ASSESSMENT AND PLAN: 1. Persistent Atrial Fibrillation (ICD10:  I48.19) The patient's CHA2DS2-VASc score is 3, indicating a 3.2% annual risk of stroke.   Suspect episode triggered by pneumonia.  General education about afib provided and questions answered. We also discussed her stroke risk and the risks and benefits of anticoagulation. Patient and family concerned about her fall risk given her dysautonomia. There is also concern about ongoing GI bleeding from an unknown source. In shared decision making, we decided not to initiate anticoagulation at this time. Patient will get in touch with GI (seen by Dr Bryan Lemma). Will change Lopressor to Toprol 25 mg HS to see if this is better tolerated.  Patient would like to discuss options with Dr Caryl Comes.   2. Secondary Hypercoagulable State (ICD10:  D68.69) The patient is at significant risk for stroke/thromboembolism based upon her CHA2DS2-VASc Score of 3.  However, the patient is not on anticoagulation due to her high bleeding risk.     3. Obesity Body mass index is 30.74 kg/m. Lifestyle  modification was discussed at length including regular exercise and weight reduction.  4.  Dysautonomia/orthostatic hypotension Limits medication options. Hopefully, she will be able to tolerate low dose BB.    Follow up with Dr Caryl Comes at next available appointment.    [facility] [address] [place], [place] [id number] [phone] 10/08/2021 2:04 PM

## 2021-10-08 NOTE — Patient Instructions (Signed)
Stop metoprolol tartrate  Start metoprolol succinate 25mg once a day at bedtime 

## 2021-10-13 ENCOUNTER — Other Ambulatory Visit (HOSPITAL_BASED_OUTPATIENT_CLINIC_OR_DEPARTMENT_OTHER): Payer: Self-pay

## 2021-10-14 ENCOUNTER — Ambulatory Visit: Payer: PPO | Admitting: Internal Medicine

## 2021-10-14 ENCOUNTER — Emergency Department (HOSPITAL_BASED_OUTPATIENT_CLINIC_OR_DEPARTMENT_OTHER): Payer: PPO

## 2021-10-14 ENCOUNTER — Ambulatory Visit: Payer: PPO | Admitting: Neurology

## 2021-10-14 ENCOUNTER — Other Ambulatory Visit (HOSPITAL_BASED_OUTPATIENT_CLINIC_OR_DEPARTMENT_OTHER): Payer: Self-pay

## 2021-10-14 ENCOUNTER — Emergency Department (HOSPITAL_BASED_OUTPATIENT_CLINIC_OR_DEPARTMENT_OTHER)
Admission: EM | Admit: 2021-10-14 | Discharge: 2021-10-14 | Disposition: A | Discharge status: Home / Self Care | Payer: PPO | Attending: Emergency Medicine | Admitting: Emergency Medicine

## 2021-10-14 ENCOUNTER — Other Ambulatory Visit: Payer: Self-pay

## 2021-10-14 ENCOUNTER — Encounter (HOSPITAL_BASED_OUTPATIENT_CLINIC_OR_DEPARTMENT_OTHER): Payer: Self-pay

## 2021-10-14 DIAGNOSIS — E860 Dehydration: Secondary | ICD-10-CM | POA: Insufficient documentation

## 2021-10-14 DIAGNOSIS — R000 Tachycardia, unspecified: Secondary | ICD-10-CM | POA: Diagnosis not present

## 2021-10-14 DIAGNOSIS — Z79899 Other long term (current) drug therapy: Secondary | ICD-10-CM | POA: Diagnosis not present

## 2021-10-14 DIAGNOSIS — N3000 Acute cystitis without hematuria: Secondary | ICD-10-CM | POA: Diagnosis not present

## 2021-10-14 DIAGNOSIS — Z7982 Long term (current) use of aspirin: Secondary | ICD-10-CM | POA: Diagnosis not present

## 2021-10-14 DIAGNOSIS — E039 Hypothyroidism, unspecified: Secondary | ICD-10-CM | POA: Diagnosis not present

## 2021-10-14 DIAGNOSIS — I4891 Unspecified atrial fibrillation: Secondary | ICD-10-CM

## 2021-10-14 DIAGNOSIS — R300 Dysuria: Secondary | ICD-10-CM | POA: Diagnosis present

## 2021-10-14 LAB — CBC WITH DIFFERENTIAL/PLATELET
Abs Immature Granulocytes: 0.08 10*3/uL — ABNORMAL HIGH (ref 0.00–0.07)
Basophils Absolute: 0.1 10*3/uL (ref 0.0–0.1)
Basophils Relative: 1 %
Eosinophils Absolute: 0.2 10*3/uL (ref 0.0–0.5)
Eosinophils Relative: 1 %
HCT: 40.8 % (ref 36.0–46.0)
Hemoglobin: 13.2 g/dL (ref 12.0–15.0)
Immature Granulocytes: 1 %
Lymphocytes Relative: 31 %
Lymphs Abs: 3.5 10*3/uL (ref 0.7–4.0)
MCH: 28 pg (ref 26.0–34.0)
MCHC: 32.4 g/dL (ref 30.0–36.0)
MCV: 86.4 fL (ref 80.0–100.0)
Monocytes Absolute: 0.7 10*3/uL (ref 0.1–1.0)
Monocytes Relative: 6 %
Neutro Abs: 6.9 10*3/uL (ref 1.7–7.7)
Neutrophils Relative %: 60 %
Platelets: 361 10*3/uL (ref 150–400)
RBC: 4.72 MIL/uL (ref 3.87–5.11)
RDW: 20 % — ABNORMAL HIGH (ref 11.5–15.5)
WBC: 11.4 10*3/uL — ABNORMAL HIGH (ref 4.0–10.5)
nRBC: 0 % (ref 0.0–0.2)

## 2021-10-14 LAB — COMPREHENSIVE METABOLIC PANEL
ALT: 15 U/L (ref 0–44)
AST: 23 U/L (ref 15–41)
Albumin: 2.5 g/dL — ABNORMAL LOW (ref 3.5–5.0)
Alkaline Phosphatase: 81 U/L (ref 38–126)
Anion gap: 10 (ref 5–15)
BUN: 17 mg/dL (ref 8–23)
CO2: 25 mmol/L (ref 22–32)
Calcium: 8.4 mg/dL — ABNORMAL LOW (ref 8.9–10.3)
Chloride: 97 mmol/L — ABNORMAL LOW (ref 98–111)
Creatinine, Ser: 1.5 mg/dL — ABNORMAL HIGH (ref 0.44–1.00)
GFR, Estimated: 35 mL/min — ABNORMAL LOW (ref >60)
Glucose, Bld: 110 mg/dL — ABNORMAL HIGH (ref 70–99)
Potassium: 4.2 mmol/L (ref 3.5–5.1)
Sodium: 132 mmol/L — ABNORMAL LOW (ref 135–145)
Total Bilirubin: 0.7 mg/dL (ref 0.3–1.2)
Total Protein: 6.4 g/dL — ABNORMAL LOW (ref 6.5–8.1)

## 2021-10-14 LAB — URINALYSIS, ROUTINE W REFLEX MICROSCOPIC
Bilirubin Urine: NEGATIVE
Glucose, UA: NEGATIVE mg/dL
Hgb urine dipstick: NEGATIVE
Ketones, ur: NEGATIVE mg/dL
Nitrite: NEGATIVE
Protein, ur: NEGATIVE mg/dL
Specific Gravity, Urine: 1.01 (ref 1.005–1.030)
pH: 5.5 (ref 5.0–8.0)

## 2021-10-14 LAB — URINALYSIS, MICROSCOPIC (REFLEX)

## 2021-10-14 MED ORDER — CEPHALEXIN 500 MG PO CAPS
500.0000 mg | ORAL_CAPSULE | Freq: Three times a day (TID) | ORAL | 0 refills | Status: AC
  Filled 2021-10-14: qty 21, 7d supply, fill #0

## 2021-10-14 MED ORDER — SODIUM CHLORIDE 0.9 % IV BOLUS
1000.0000 mL | Freq: Once | INTRAVENOUS | Status: AC
  Administered 2021-10-14: 1000 mL via INTRAVENOUS

## 2021-10-14 NOTE — ED Triage Notes (Signed)
Pt c/o difficulty with urination. Pt state she has only had "dribbling" urine and burning with urination that started yesterday.

## 2021-10-14 NOTE — Discharge Instructions (Signed)
Follow-up with your primary care physician if urinary hesitancy does not improve after antibiotics.  Keflex sent to pharmacy.

## 2021-10-14 NOTE — ED Provider Notes (Signed)
3:55 PM Patient signed out to me by previous ED physician. Pt is a 81 yo female presenting for decreased urine.   Concerns for dehydration +/- UTI. Bladder scan 100 cc only Received IVF  Plan: Pending UA  Physical Exam  BP (!) 145/96   Pulse (!) 112   Temp 97.6 F (36.4 C) (Oral)   Resp 18   Ht 5\' 7"  (1.702 m)   Wt 91.2 kg   SpO2 93%   BMI 31.48 kg/m   Physical Exam Vitals and nursing note reviewed.  Constitutional:      General: She is not in acute distress.    Appearance: She is well-developed.  HENT:     Head: Normocephalic and atraumatic.  Eyes:     Conjunctiva/sclera: Conjunctivae normal.  Cardiovascular:     Rate and Rhythm: Normal rate and regular rhythm.     Heart sounds: No murmur heard. Pulmonary:     Effort: Pulmonary effort is normal. No respiratory distress.     Breath sounds: Normal breath sounds.  Abdominal:     Palpations: Abdomen is soft.     Tenderness: There is no abdominal tenderness.  Musculoskeletal:        General: No swelling.     Cervical back: Neck supple.  Skin:    General: Skin is warm and dry.     Capillary Refill: Capillary refill takes less than 2 seconds.  Neurological:     Mental Status: She is alert.  Psychiatric:        Mood and Affect: Mood normal.    Procedures  Procedures  ED Course / MDM    Medical Decision Making Amount and/or Complexity of Data Reviewed Labs: ordered. Radiology: ordered.  Risk Prescription drug management.   UA demonstrates UTI. Prescription for Keflex given. Patient in no distress and overall condition improved here in the ED. Detailed discussions were had with the patient regarding current findings, and need for close f/u with PCP or on call doctor. The patient has been instructed to return immediately if the symptoms worsen in any way for re-evaluation. Patient verbalized understanding and is in agreement with current care plan. All questions answered prior to discharge.        P, DO 10/17/21 1155

## 2021-10-14 NOTE — ED Notes (Signed)
Bladder scan on patient had 157ml.

## 2021-10-14 NOTE — ED Notes (Signed)
Pt back to bed after voiding , ua to lab

## 2021-10-14 NOTE — ED Notes (Addendum)
Pt states cannot pee with the purwick wants to get  up to beds side potty, pt assited up to bedside potty to pee, daughter with pt

## 2021-10-14 NOTE — ED Provider Notes (Signed)
[facility] Provider Note   CSN: [id number] Arrival date & time: 10/14/21  1333     History  Chief Complaint  Patient presents with   Dysuria    Gearline Spilman is a 81 y.o. female.  Pt is a 81 yo female with a pmhx significant for migraines, hyperlipidemia, depression, hypothyroidism, renal insufficiency, orthostatic hypotension, gi bleed, dysautonomia, and afib (not on thinners due to recent GI bleed).  Pt has been having the sensation of not urinating correctly.  She has just been dribbling and burning.  Pt does have afib with rvr.  Rate has been difficult to manage due to severe dysautonomia.  Pt has seen cards, but they are waiting until she sees Dr. Caryl Comes before they decide on what to do next.      Home Medications Prior to Admission medications   Medication Sig Start Date End Date Taking? Authorizing Provider  aspirin-acetaminophen-caffeine (EXCEDRIN MIGRAINE) [phone] MG tablet Take by mouth every 6 (six) hours as needed for headache.    [provider]  botulinum toxin Type A (BOTOX) 100 units SOLR injection Inject IM every 3 months by MD in the office 12/07/17   Marcial Pacas, MD  buPROPion (WELLBUTRIN XL) 150 MG 24 hr tablet Take 1 tablet by mouth every morning. 06/19/21   [provider]  Cholecalciferol (D3-1000 PO) Take 1 tablet by mouth daily.    [provider]  diphenhydrAMINE (BENADRYL) 25 mg capsule Take 25 mg by mouth every 6 (six) hours as needed.    [provider]  doxycycline (VIBRAMYCIN) 100 MG capsule Take 1 capsule (100 mg total) by mouth 2 (two) times daily. 10/05/21   Isla Pence, MD  fludrocortisone (FLORINEF) 0.1 MG tablet Take 1 tablet (0.1 mg total) by mouth 3 (three) times daily. 06/30/21 06/30/22  Marcial Pacas, MD  gabapentin (NEURONTIN) 300 MG capsule Take by mouth.    [provider]  metoCLOPramide (REGLAN) 5 MG tablet Take 1 tablet (5 mg total) by mouth 3 (three) times daily.  Start taking prior to prep. 09/21/21   Cirigliano, Vito V, DO  metoprolol succinate (TOPROL XL) 25 MG 24 hr tablet Take 1 tablet (25 mg total) by mouth at bedtime. 10/08/21 10/08/22  Fenton, Clint R, PA  midodrine (PROAMATINE) 10 MG tablet Take 1 tablet (10 mg total) by mouth 3 (three) times daily. 01/16/21   Marcial Pacas, MD  Multiple Vitamins-Minerals (PRESERVISION AREDS 2) CAPS Take 2 capsules by mouth.    [provider]  ondansetron (ZOFRAN-ODT) 8 MG disintegrating tablet Take 1 tablet (8 mg total) by mouth every 8 (eight) hours as needed for nausea or vomiting. 07/22/21   Marcial Pacas, MD  potassium chloride (KLOR-CON) 10 MEQ tablet Take 2 tablets (20 mEq total) by mouth daily. 10/05/21   Isla Pence, MD  pravastatin (PRAVACHOL) 20 MG tablet Take 1 tablet (20 mg total) by mouth nightly. 06/19/21     Probiotic Product (PROBIOTIC PO) Take 1 capsule by mouth as directed.    [provider]  pyridostigmine (MESTINON) 60 MG tablet Take 1 tablet (60 mg total) by mouth 3 (three) times daily as needed. 07/22/21   Marcial Pacas, MD  sertraline (ZOLOFT) 100 MG tablet Take 1 tablet by mouth in the morning 06/19/21     Sod Picosulfate-Mag Ox-Cit Acd (CLENPIQ) 10-3.5-12 MG-GM -GM/160ML SOLN Take 1 kit by mouth as directed. 09/21/21   Cirigliano, Vito V, DO  sodium fluoride (SF 5000 PLUS) 1.1 % CREA dental cream  Brush teeth 3 to 5 minutes at bedtime 06/01/21     SUMAtriptan (IMITREX) 100 MG tablet Take 1 tab at onset of migraine.  May repeat in 2 hrs, if needed.  Max dose: 2 tabs/day. This is a 30 day prescription. 07/22/21   Marcial Pacas, MD  SYNTHROID 88 MCG tablet Take 1 tablet (88 mcg total) by mouth daily before breakfast. 07/07/21   Philemon Kingdom, MD  tiZANidine (ZANAFLEX) 2 MG tablet TAKE 1 TABLET BY MOUTH EVERY 8 HOURS AS NEEDED FOR MUSCLE SPASMS 07/02/21 07/02/22  Marcial Pacas, MD  vitamin B-12 (CYANOCOBALAMIN) 1000 MCG tablet Take 1,000 mcg by mouth daily.    [provider]  zolpidem (AMBIEN) 10  MG tablet Take 1 tablet (10 mg total) by mouth at bedtime as needed for sleep. 08/26/21 02/22/22  Genia Harold, MD  Zoster Vaccine Adjuvanted [facility]) injection Inject into the muscle. 08/04/21   Carlyle Basques, MD      Allergies    Patient has no known allergies.    Review of Systems   Review of Systems  Genitourinary:  Positive for difficulty urinating and dysuria.  All other systems reviewed and are negative.  Physical Exam Updated Vital Signs BP (!) 145/96   Pulse (!) 112   Temp 97.6 F (36.4 C) (Oral)   Resp 18   Ht _0  (1.702 m)   Wt 91.2 kg   SpO2 93%   BMI 31.48 kg/m  Physical Exam Vitals and nursing note reviewed.  Constitutional:      Appearance: Normal appearance.  HENT:     Head: Normocephalic and atraumatic.     Right Ear: External ear normal.     Left Ear: External ear normal.     Nose: Nose normal.     Mouth/Throat:     Mouth: Mucous membranes are moist.     Pharynx: Oropharynx is clear.  Eyes:     Extraocular Movements: Extraocular movements intact.     Conjunctiva/sclera: Conjunctivae normal.     Pupils: Pupils are equal, round, and reactive to light.  Cardiovascular:     Rate and Rhythm: Tachycardia present. Rhythm irregular.     Pulses: Normal pulses.     Heart sounds: Normal heart sounds.  Pulmonary:     Effort: Pulmonary effort is normal.     Breath sounds: Normal breath sounds.  Abdominal:     General: Abdomen is flat. Bowel sounds are normal.     Palpations: Abdomen is soft.  Musculoskeletal:        General: Normal range of motion.  Skin:    General: Skin is warm.     Capillary Refill: Capillary refill takes less than 2 seconds.  Neurological:     General: No focal deficit present.     Mental Status: She is alert and oriented to person, place, and time.  Psychiatric:        Mood and Affect: Mood normal.        Behavior: Behavior normal.        Thought Content: Thought content normal.        Judgment: Judgment normal.    ED  Results / Procedures / Treatments   Labs (all labs ordered are listed, but only abnormal results are displayed) Labs Reviewed  COMPREHENSIVE METABOLIC PANEL - Abnormal; Notable for the following components:      Result Value   Sodium 132 (*)    Chloride 97 (*)    Glucose, Bld 110 (*)    Creatinine,  Ser 1.50 (*)    Calcium 8.4 (*)    Total Protein 6.4 (*)    Albumin 2.5 (*)    GFR, Estimated 35 (*)    All other components within normal limits  CBC WITH DIFFERENTIAL/PLATELET - Abnormal; Notable for the following components:   WBC 11.4 (*)    RDW 20.0 (*)    Abs Immature Granulocytes 0.08 (*)    All other components within normal limits  URINE CULTURE  URINALYSIS, ROUTINE W REFLEX MICROSCOPIC    EKG EKG Interpretation  Date/Time:  Wednesday Oct 14 2021 14:26:20 EDT Ventricular Rate:  115 PR Interval:    QRS Duration: 93 QT Interval:  332 QTC Calculation: 460 R Axis:   14 Text Interpretation: Atrial fibrillation Borderline repolarization abnormality Baseline wander in lead(s) V3 No significant change since last tracing Confirmed by Isla Pence [phone]) on 10/14/2021 4:14:13 PM  Radiology No results found.  Procedures Procedures    Medications Ordered in ED Medications  sodium chloride 0.9 % bolus 1,000 mL (0 mLs Intravenous Stopped 10/14/21 1544)    ED Course/ Medical Decision Making/ A&P                           Medical Decision Making Amount and/or Complexity of Data Reviewed Labs: ordered. Radiology: ordered.   This patient presents to the ED for concern of dysuria, this involves an extensive number of treatment options, and is a complaint that carries with it a high risk of complications and morbidity.  The differential diagnosis includes uti, urinary retention   Co morbidities that complicate the patient evaluation  migraines, hyperlipidemia, depression, hypothyroidism, renal insufficiency, orthostatic hypotension, gi bleed, dysautonomia, and afib (not  on thinners due to recent GI bleed).   Additional history obtained:  Additional history obtained from epic chart review External records from outside source obtained and reviewed including daughter   Lab Tests:  I Ordered, and personally interpreted labs.  The pertinent results include:  cbc nl, cmp with Cr slightly bumped at 1.5.  Urine pending at shift change.   Imaging Studies ordered:  I ordered imaging studies including cxr  Pending at shift change   Cardiac Monitoring:  The patient was maintained on a cardiac monitor.  I personally viewed and interpreted the cardiac monitored which showed an underlying rhythm of: afib with rvr   Medicines ordered and prescription drug management:  I ordered medication including ivfs  for dehydration  Reevaluation of the patient after these medicines showed that the patient improved I have reviewed the patients home medicines and have made adjustments as needed   Problem List / ED Course:  Dysuria:  bladder scan shows just over 100cc of urine.  Urine pending at shift change. Afib with rvr:  pt is always tachycardic.  She is scheduled to see Dr. Caryl Comes.  She is not a anticoagulation candidate.   Reevaluation:  After the interventions noted above, I reevaluated the patient and found that they have :improved   Social Determinants of Health:  Lives at home with daughter   Dispostion:  After consideration of the diagnostic results and the patients response to treatment, I feel that the patent would benefit from discharge with outpatient f/u.          Final Clinical Impression(s) / ED Diagnoses Final diagnoses:  Dehydration  Atrial fibrillation with rapid ventricular response ([place])    Rx / DC Orders ED Discharge Orders     None  Isla Pence, MD 10/14/21 [phone]

## 2021-10-14 NOTE — ED Notes (Addendum)
Did not discharge this patient. Charted on in error

## 2021-10-15 ENCOUNTER — Ambulatory Visit: Payer: PPO | Admitting: Neurology

## 2021-10-15 ENCOUNTER — Telehealth: Payer: Self-pay | Admitting: Neurology

## 2021-10-15 ENCOUNTER — Ambulatory Visit: Payer: PPO | Admitting: Internal Medicine

## 2021-10-15 LAB — URINE CULTURE: Culture: NO GROWTH

## 2021-10-15 NOTE — Telephone Encounter (Signed)
I called Mallory Durham to try and discuss questions with her today but she sts she would prefer to speak with Mallory Durham. I advised I would make note of this.

## 2021-10-15 NOTE — Telephone Encounter (Signed)
Pt's daughter, Harless Litten (on [place]) need to speak with the nurse before mother's appt at 4 pm today.  Cell [phone]

## 2021-10-15 NOTE — Telephone Encounter (Signed)
Left message for a return call

## 2021-10-15 NOTE — Progress Notes (Deleted)
BOTOX 200 UNITS w/ 2 vials  NDC: 4098-1191-47 LOT: [id number] EXP: 2025/12  B/B

## 2021-10-15 NOTE — Telephone Encounter (Signed)
I spoke to the patient's daughter. She informed the Mallory Durham had to go the ED for dehydration and UTI. She was not feeling well today and missed her appt. She has been rescheduled.

## 2021-10-16 ENCOUNTER — Other Ambulatory Visit (HOSPITAL_BASED_OUTPATIENT_CLINIC_OR_DEPARTMENT_OTHER): Payer: Self-pay

## 2021-10-16 ENCOUNTER — Telehealth: Payer: Self-pay

## 2021-10-16 DIAGNOSIS — R110 Nausea: Secondary | ICD-10-CM

## 2021-10-16 DIAGNOSIS — D509 Iron deficiency anemia, unspecified: Secondary | ICD-10-CM

## 2021-10-16 DIAGNOSIS — R195 Other fecal abnormalities: Secondary | ICD-10-CM

## 2021-10-16 DIAGNOSIS — R630 Anorexia: Secondary | ICD-10-CM

## 2021-10-16 NOTE — Telephone Encounter (Signed)
LVM Patient's daughter Angelique Blonder to schedule Egd and colon at [facility] with Dr. Barron Alvine on August 10.

## 2021-10-19 ENCOUNTER — Other Ambulatory Visit (HOSPITAL_BASED_OUTPATIENT_CLINIC_OR_DEPARTMENT_OTHER): Payer: Self-pay

## 2021-10-20 ENCOUNTER — Encounter (HOSPITAL_BASED_OUTPATIENT_CLINIC_OR_DEPARTMENT_OTHER): Payer: Self-pay | Admitting: Urology

## 2021-10-20 ENCOUNTER — Emergency Department (HOSPITAL_BASED_OUTPATIENT_CLINIC_OR_DEPARTMENT_OTHER)
Admission: EM | Admit: 2021-10-20 | Discharge: 2021-10-20 | Disposition: A | Discharge status: Home / Self Care | Payer: PPO | Attending: Emergency Medicine | Admitting: Emergency Medicine

## 2021-10-20 ENCOUNTER — Emergency Department (HOSPITAL_BASED_OUTPATIENT_CLINIC_OR_DEPARTMENT_OTHER): Payer: PPO

## 2021-10-20 DIAGNOSIS — R531 Weakness: Secondary | ICD-10-CM | POA: Diagnosis present

## 2021-10-20 DIAGNOSIS — Z8616 Personal history of COVID-19: Secondary | ICD-10-CM | POA: Diagnosis not present

## 2021-10-20 DIAGNOSIS — R55 Syncope and collapse: Secondary | ICD-10-CM | POA: Insufficient documentation

## 2021-10-20 DIAGNOSIS — E039 Hypothyroidism, unspecified: Secondary | ICD-10-CM | POA: Insufficient documentation

## 2021-10-20 DIAGNOSIS — R0602 Shortness of breath: Secondary | ICD-10-CM | POA: Diagnosis not present

## 2021-10-20 DIAGNOSIS — R109 Unspecified abdominal pain: Secondary | ICD-10-CM | POA: Insufficient documentation

## 2021-10-20 HISTORY — DX: Unspecified atrial fibrillation: I48.91

## 2021-10-20 LAB — COMPREHENSIVE METABOLIC PANEL
ALT: 23 U/L (ref 0–44)
AST: 30 U/L (ref 15–41)
Albumin: 2.6 g/dL — ABNORMAL LOW (ref 3.5–5.0)
Alkaline Phosphatase: 106 U/L (ref 38–126)
Anion gap: 7 (ref 5–15)
BUN: 12 mg/dL (ref 8–23)
CO2: 24 mmol/L (ref 22–32)
Calcium: 8.2 mg/dL — ABNORMAL LOW (ref 8.9–10.3)
Chloride: 96 mmol/L — ABNORMAL LOW (ref 98–111)
Creatinine, Ser: 1.23 mg/dL — ABNORMAL HIGH (ref 0.44–1.00)
GFR, Estimated: 44 mL/min — ABNORMAL LOW (ref >60)
Glucose, Bld: 119 mg/dL — ABNORMAL HIGH (ref 70–99)
Potassium: 4.4 mmol/L (ref 3.5–5.1)
Sodium: 127 mmol/L — ABNORMAL LOW (ref 135–145)
Total Bilirubin: 0.8 mg/dL (ref 0.3–1.2)
Total Protein: 6.6 g/dL (ref 6.5–8.1)

## 2021-10-20 LAB — CBC WITH DIFFERENTIAL/PLATELET
Abs Immature Granulocytes: 0.03 10*3/uL (ref 0.00–0.07)
Basophils Absolute: 0 10*3/uL (ref 0.0–0.1)
Basophils Relative: 0 %
Eosinophils Absolute: 0 10*3/uL (ref 0.0–0.5)
Eosinophils Relative: 0 %
HCT: 37.8 % (ref 36.0–46.0)
Hemoglobin: 12.7 g/dL (ref 12.0–15.0)
Immature Granulocytes: 0 %
Lymphocytes Relative: 23 %
Lymphs Abs: 2.2 10*3/uL (ref 0.7–4.0)
MCH: 28.9 pg (ref 26.0–34.0)
MCHC: 33.6 g/dL (ref 30.0–36.0)
MCV: 85.9 fL (ref 80.0–100.0)
Monocytes Absolute: 0.7 10*3/uL (ref 0.1–1.0)
Monocytes Relative: 7 %
Neutro Abs: 6.4 10*3/uL (ref 1.7–7.7)
Neutrophils Relative %: 70 %
Platelets: 381 10*3/uL (ref 150–400)
RBC: 4.4 MIL/uL (ref 3.87–5.11)
RDW: 19.9 % — ABNORMAL HIGH (ref 11.5–15.5)
WBC: 9.3 10*3/uL (ref 4.0–10.5)
nRBC: 0 % (ref 0.0–0.2)

## 2021-10-20 LAB — BRAIN NATRIURETIC PEPTIDE: B Natriuretic Peptide: 621.8 pg/mL — ABNORMAL HIGH (ref 0.0–100.0)

## 2021-10-20 LAB — TROPONIN I (HIGH SENSITIVITY)
Troponin I (High Sensitivity): 7 ng/L (ref <18)
Troponin I (High Sensitivity): 7 ng/L (ref <18)

## 2021-10-20 MED ORDER — SODIUM CHLORIDE 0.9 % IV BOLUS
500.0000 mL | Freq: Once | INTRAVENOUS | Status: AC
  Administered 2021-10-20: 500 mL via INTRAVENOUS

## 2021-10-20 NOTE — ED Triage Notes (Signed)
SOB with exertion, weakness, and fatigue that started 3-4 days ago Gradually worsening Dx with Afib 3 weeks ago   20G IV in RAC placed by EMS

## 2021-10-20 NOTE — ED Notes (Signed)
Still no urine after bolus, pt up to [facility] at present time

## 2021-10-20 NOTE — ED Notes (Signed)
PT bladder scanned per RN. Observed. PT placed on bedside commode

## 2021-10-20 NOTE — ED Provider Notes (Signed)
Emergency Department Provider Note   I have reviewed the triage vital signs and the nursing notes.   HISTORY  Chief Complaint Shortness of Breath   HPI Mallory Durham is a 81 y.o. female past medical history reviewed below presents to the ED with generalized weakness.  She has been struggling with atrial fibrillation which has been somewhat difficult to rate control per chart review.  She has some autonomic dysfunction and orthostatic hypotension and is having some difficulty tolerating beta blocker.  She is not anticoagulated with prior history of some GI bleeding and fall risk. She denies any CP but is feeling tremendous weakness and had an episode of near syncope today. No fall or head injury. Her weakness and been gradually building over the last 3 days.    Past Medical History:  Diagnosis Date   2019 novel coronavirus disease (COVID-19)    A-fib (HCC)    Anxiety    Confusion, hx of, without neuro findings    Depression    Hx of vertigo    Hyperlipidemia    Hypothyroid    Left shoulder pain    Migraines    Neck pain    Orthostatic hypotension    Renal insufficiency     Review of Systems  Constitutional: No fever/chills. Positive weakness.  Eyes: No visual changes. ENT: No sore throat. Cardiovascular: Denies chest pain. Positive palpitations and near syncope today.  Respiratory: Denies shortness of breath. Gastrointestinal: No abdominal pain.  Mild nausea, no vomiting.  Positive diarrhea.  No constipation. Genitourinary: Negative for dysuria. Musculoskeletal: Negative for back pain. Skin: Negative for rash. Neurological: Negative for headaches, focal weakness or numbness.  ____________________________________________   PHYSICAL EXAM:  VITAL SIGNS: ED Triage Vitals  Enc Vitals Group     BP 10/20/21 1636 (!) 147/103     Pulse Rate 10/20/21 1636 (!) 105     Resp 10/20/21 1636 19     Temp 10/20/21 1636 (!) 97.5 F (36.4 C)     Temp Source 10/20/21 1636  Oral     SpO2 10/20/21 1636 93 %     Weight 10/20/21 1632 201 lb (91.2 kg)     Height 10/20/21 1632 5\' 7"  (1.702 m)   Constitutional: Alert and oriented. Fatigued but no acute distress.  Eyes: Conjunctivae are normal.  Head: Atraumatic. Nose: No congestion/rhinnorhea. Mouth/Throat: Mucous membranes are dry.  Neck: No stridor.   Cardiovascular: A fib. Good peripheral circulation. Grossly normal heart sounds.   Respiratory: Normal respiratory effort.  No retractions. Lungs CTAB. Gastrointestinal: Soft and nontender. No distention.  Musculoskeletal: No lower extremity tenderness nor edema. No gross deformities of extremities. Neurologic:  Normal speech and language. No gross focal neurologic deficits are appreciated.  Skin:  Skin is warm, dry and intact. No rash noted.   ____________________________________________   LABS (all labs ordered are listed, but only abnormal results are displayed)  Labs Reviewed  COMPREHENSIVE METABOLIC PANEL - Abnormal; Notable for the following components:      Result Value   Sodium 127 (*)    Chloride 96 (*)    Glucose, Bld 119 (*)    Creatinine, Ser 1.23 (*)    Calcium 8.2 (*)    Albumin 2.6 (*)    GFR, Estimated 44 (*)    All other components within normal limits  BRAIN NATRIURETIC PEPTIDE - Abnormal; Notable for the following components:   B Natriuretic Peptide 621.8 (*)    All other components within normal limits  CBC WITH DIFFERENTIAL/PLATELET -  Abnormal; Notable for the following components:   RDW 19.9 (*)    All other components within normal limits  URINE CULTURE  URINALYSIS, ROUTINE W REFLEX MICROSCOPIC  TROPONIN I (HIGH SENSITIVITY)  TROPONIN I (HIGH SENSITIVITY)   ____________________________________________  EKG   EKG Interpretation  Date/Time:  Tuesday October 20 2021 16:39:35 EDT Ventricular Rate:  111 PR Interval:    QRS Duration: 66 QT Interval:  388 QTC Calculation: 528 R Axis:   20 Text Interpretation: Atrial  fibrillation Borderline repolarization abnormality Prolonged QT interval Confirmed by Nanda Quinton [phone]) on 10/20/2021 4:52:08 PM        ____________________________________________  RADIOLOGY  DG Chest Portable 1 View  Result Date: 10/20/2021 CLINICAL DATA:  SOB EXAM: PORTABLE CHEST 1 VIEW COMPARISON:  Chest x-ray 10/14/2021, CT chest 10/05/2021 FINDINGS: The heart and mediastinal contours are unchanged. Aortic calcification. At least moderate volume hiatal hernia. No focal consolidation. Chronic coarsened markings with no overt pulmonary edema. No pleural effusion. No pneumothorax. No acute osseous abnormality. IMPRESSION: 1. No active disease. 2. At least moderate volume hiatal hernia. Electronically Signed   By: Iven Finn M.D.   On: 10/20/2021 16:59    ____________________________________________   PROCEDURES  Procedure(s) performed:   Procedures   ____________________________________________   INITIAL IMPRESSION / ASSESSMENT AND PLAN / ED COURSE  Pertinent labs & imaging results that were available during my care of the patient were reviewed by me and considered in my medical decision making (see chart for details).   This patient is Presenting for Evaluation of weakness, which does require a range of treatment options, and is a complaint that involves a high risk of morbidity and mortality.  The Differential Diagnoses include but not exclusive to dehydration, ACS, PE, PAF, developing sepsis, UTI.  Critical Interventions-    Medications  sodium chloride 0.9 % bolus 500 mL (0 mLs Intravenous Stopped 10/20/21 1855)    Reassessment after intervention:     I did obtain Additional Historical Information from family at bedside.  I decided to review pertinent External Data, and in summary patient evaluated in the A fib clinic on 5/25, reviewed that note.    Clinical Laboratory Tests Ordered, included Labs show mild hyponatremia 127.  Creatinine 1.23 similar to prior  CKD values.  No anemia.  No leukocytosis.  BNP elevated to 621 and initial troponin is normal.  Radiologic Tests Ordered, included CXR. I independently interpreted the images and agree with radiology interpretation.   Cardiac Monitor Tracing which shows A fib.   Social Determinants of Health Risk patient is a non-smoker.  Consult complete with  Medical Decision Making: Summary:  Patient presents emergency department for evaluation of generalized weakness, palpitations, near syncope.  She does not appear acutely volume overloaded on my initial assessment.  Describes some diarrhea and does appear somewhat dehydrated.  Gave initial small fluid bolus pending initial blood work.   Reevaluation with update and discussion with   ***Considered admission***  Disposition:   ____________________________________________  FINAL CLINICAL IMPRESSION(S) / ED DIAGNOSES  Final diagnoses:  None     NEW OUTPATIENT MEDICATIONS STARTED DURING THIS VISIT:  New Prescriptions   No medications on file    Note:  This document was prepared using Dragon voice recognition software and may include unintentional dictation errors.  Nanda Quinton, MD, [facility]

## 2021-10-20 NOTE — Discharge Instructions (Signed)
You were seen in the emergency department today with generalized weakness.  Your lab work here shows some mild decreased sodium but not severe to the point of causing any symptoms.  I suspect this is related to your A-fib and may be made worse by her underlying autonomic issues and some of your medications.  Please continue to follow closely with your primary care doctor and cardiology.  If you develop any new or suddenly worsening symptoms please return to the emergency department for reevaluation.

## 2021-10-20 NOTE — ED Notes (Signed)
Patient transported to CT 

## 2021-10-21 ENCOUNTER — Other Ambulatory Visit (HOSPITAL_BASED_OUTPATIENT_CLINIC_OR_DEPARTMENT_OTHER): Payer: Self-pay

## 2021-10-21 MED ORDER — FLUCONAZOLE 150 MG PO TABS
ORAL_TABLET | ORAL | 0 refills | Status: DC
  Filled 2021-10-21: qty 1, 1d supply, fill #0

## 2021-10-21 MED ORDER — NYSTATIN 100000 UNIT/GM EX CREA
TOPICAL_CREAM | CUTANEOUS | 0 refills | Status: DC
  Filled 2021-10-21: qty 15, 10d supply, fill #0

## 2021-10-22 ENCOUNTER — Telehealth: Payer: Self-pay | Admitting: Neurology

## 2021-10-22 NOTE — Telephone Encounter (Signed)
Spoke with Patient's daughter Angelique Blonder to schedule Egd and colon at [facility] with Dr. Barron Alvine on August 10, 23. Stated she is not sure the date will work or not, will be calling back later to let us know.

## 2021-10-22 NOTE — Telephone Encounter (Signed)
Pt's daughter, Harless Litten. Mother think about not doing Botox. Would like a call from the nurse to discuss her continuing to get Botox

## 2021-10-23 NOTE — Telephone Encounter (Signed)
Left message on her daughter's voicemail (ok per DPR). It is up to her mother whether or not she continues Botox. If she is unsure if it is helpful, then we can cancel her 10/28/21 appt to see how she does without it. We can always reschedule the injections if she realizes the medication offered some value to her treatment.  Provided our number and office hours. Requested her daughter to call back to advise on her pending appt next week.

## 2021-10-26 NOTE — Telephone Encounter (Signed)
I spoke to her Adan Sis. Reports her mother's overall condition has declined. She wants to hold off on Botox injections for now. She is having the following: increased confusion, restlessness, anxiousness, extremity swelling, fatigue. Pt talking more of not have long to live and being ready to see her deceased husband. Ambien is no longer working to help her sleep. It is actually causing her to feel more restless. Her dgt has been giving her OTC Benadryl and tizanidine which his helping for now. She is seeing cardiology 10/28/21. Pending follow up with Dr. Krista Blue 11/04/21.

## 2021-10-27 NOTE — Progress Notes (Unsigned)
Cardiology Office Note Date:  10/28/2021  Patient ID:  Mallory Durham, DOB 1940-09-30, MRN [id number] PCP:  Lorenda Hatchet, [place]  Cardiologist:  Dr. Acie Fredrickson Electrophysiologist: Dr. Caryl Comes    Chief Complaint: progressive fatigue, weakness  History of Present Illness: Mallory Durham is a 81 y.o. female with history of dysautonomia/orthostatic hypotension, hypothyroidism, CKD (III), AFib  Pt comes in today to be seen for Dr. Caryl Comes, last seen by him 2021  She was seen at an [facility] 10/05/21 with c/o SOB and found in rapid Afib in setting of a CAP > to the ER, with hx of falls, GIB started on on rate control w/BB and referred to the AFib clinic. She saw R. Fenton 10/08/21, she was very tired, her daughter reports progression of her dysautonomia, the patient with no cardiac awareness of the AFib She remained in Afib rate 106. They discussed AFib management strategies, and in detail stroke risks, potential risks/benefits of a/c.  The patient/daughter felt most comfortable avoiding a/c with her falls and ongoing GI evaluation for possible GIB of unknown source. The patient wanted to see Dr. Caryl Comes and discuss further. Lopressor changed to HS toprol in effort to reduce medication side effects  She had another ER visit, 10/20/21, for ?generalized weakness, ? SOB, BP was 147/103, HR was 105 by chart record. HS trop neg x2 Her sodium was 127 BNP 621 She was given IVF, monitored and feeling better, discharged from the ER  TODAY She is accompanied by her daughter (an Therapist, sports) and her son in Sports coach. They report decades of her automatic symptoms and problems, though in the last 2 years seems to have had a more progressive decline in functional status and of late since the Afib started marked reduction in strength, increase in fatigue, weakness. Her daughter mentions that at her baseline she is able to assist with transfer from bed to chair, chair to to wheelchair but now she has to manually help her roll over in  bed/change positions to weak to even adjust herself in bed.  The patient denies SOB or CP, agrees with her daughter's assessment and really defers to her daughter for MPHx and HPI, though does add in when asked.  They report that for some years she has been unabel t stand long enough to do simple tasks, now can not stand at all without feeling dizzy, suspects the Afib has out her over this edge. She has no cardiac awareness.  A couple other mentions She is a DNR She does not want procedures They all say that she is having trouble eating, seems to get stuck right before passing to her stomach and is often regurgitating her food, though does eat pudding ok and taking her pills this way Discussed if unable to eat she will need to get admitted for nourishment, the patient immediately balks at this, and discussed that if unable to eat she will certain,ly continue to deteriorate and they mention she would want comfort only at the end. Her daughter says she has a call/message out to her PMD regarding her dysphagia They would all be open to palliative and [place] discussions  5. Daughter worries her K+ may be low because she finds the K+ pill un-dissolved in her stool 6. Was very unhappy with the GI MD they saw, insisted she could tolerate a colonoscopy done in the office They are waiting for a 2nd opinion referal Remain very weary about adding a blood thinner until presume GIB is better understood and orthostatic/dysautonomic symptoms  are improved  On a positive note Her migraine headaches are better Her chronic itching is also better    AFib /AAD hx Diagnosed may 2023   Past Medical History:  Diagnosis Date   2019 novel coronavirus disease (COVID-19)    A-fib (HCC)    Anxiety    Confusion, hx of, without neuro findings    Depression    Hx of vertigo    Hyperlipidemia    Hypothyroid    Left shoulder pain    Migraines    Neck pain    Orthostatic hypotension    Renal insufficiency      Past Surgical History:  Procedure Laterality Date   ABDOMINAL HYSTERECTOMY     ABDOMINAL HYSTERECTOMY     BREAST LUMPECTOMY     CHOLECYSTECTOMY     COLONOSCOPY     Done in [facility] [place] about 15 years ago   JOINT REPLACEMENT     THYROID SURGERY     TONSILLECTOMY      Current Outpatient Medications  Medication Sig Dispense Refill   aspirin-acetaminophen-caffeine (EXCEDRIN MIGRAINE) 250-250-65 MG tablet Take by mouth every 6 (six) hours as needed for headache.     botulinum toxin Type A (BOTOX) 100 units SOLR injection Inject IM every 3 months by MD in the office 2 vial 3   buPROPion (WELLBUTRIN XL) 150 MG 24 hr tablet Take 1 tablet by mouth every morning.     Cholecalciferol (D3-1000 PO) Take 1 tablet by mouth daily.     diphenhydrAMINE (BENADRYL) 25 mg capsule Take 25 mg by mouth every 6 (six) hours as needed.     fluconazole (DIFLUCAN) 150 MG tablet Take one tablet by mouth now for yeast, as needed. 1 tablet 0   fludrocortisone (FLORINEF) 0.1 MG tablet Take 1 tablet (0.1 mg total) by mouth 3 (three) times daily. 90 tablet 3   gabapentin (NEURONTIN) 100 MG capsule Take 300 mg by mouth 3 (three) times daily.     metoprolol succinate (TOPROL XL) 25 MG 24 hr tablet Take 1 tablet (25 mg total) by mouth at bedtime. 30 tablet 3   midodrine (PROAMATINE) 10 MG tablet Take 1 tablet (10 mg total) by mouth 3 (three) times daily. 270 tablet 3   Multiple Vitamins-Minerals (PRESERVISION AREDS 2) CAPS Take 2 capsules by mouth.     nystatin cream (MYCOSTATIN) Apply topically 2 (two)  times daily as needed ((yeast on skin) (EXTERNAL/TOPICAL USE ONLY)). 15 g 0   ondansetron (ZOFRAN-ODT) 8 MG disintegrating tablet Take 1 tablet (8 mg total) by mouth every 8 (eight) hours as needed for nausea or vomiting. 30 tablet 5   potassium chloride (KLOR-CON) 10 MEQ tablet Take 2 tablets (20 mEq total) by mouth daily. 60 tablet 0   pravastatin (PRAVACHOL) 20 MG tablet Take 1 tablet (20 mg total) by mouth  nightly. 30 tablet 5   Probiotic Product (PROBIOTIC PO) Take 1 capsule by mouth as directed.     pyridostigmine (MESTINON) 60 MG tablet Take 1 tablet (60 mg total) by mouth 3 (three) times daily as needed. 90 tablet 3   sertraline (ZOLOFT) 100 MG tablet Take 1 tablet by mouth in the morning 30 tablet 5   Sod Picosulfate-Mag Ox-Cit Acd (CLENPIQ) 10-3.5-12 MG-GM -GM/160ML SOLN Take 1 kit by mouth as directed. 320 mL 0   sodium fluoride (SF 5000 PLUS) 1.1 % CREA dental cream Brush teeth 3 to 5 minutes at bedtime 51 g 0   SUMAtriptan (IMITREX) 100 MG tablet  Take 1 tab at onset of migraine.  May repeat in 2 hrs, if needed.  Max dose: 2 tabs/day. This is a 30 day prescription. 9 tablet 11   SYNTHROID 88 MCG tablet Take 1 tablet (88 mcg total) by mouth daily before breakfast. 90 tablet 3   tiZANidine (ZANAFLEX) 2 MG tablet TAKE 1 TABLET BY MOUTH EVERY 8 HOURS AS NEEDED FOR MUSCLE SPASMS 30 tablet 1   vitamin B-12 (CYANOCOBALAMIN) 1000 MCG tablet Take 1,000 mcg by mouth daily.     zolpidem (AMBIEN) 10 MG tablet Take 1 tablet (10 mg total) by mouth at bedtime as needed for sleep. 30 tablet 5   Zoster Vaccine Adjuvanted [facility]) injection Inject into the muscle. 1 each 1   No current facility-administered medications for this visit.    Allergies:   Patient has no known allergies.   Social History:  The patient  reports that she has never smoked. She has never used smokeless tobacco. She reports that she does not drink alcohol and does not use drugs.   Family History:  The patient's family history includes Cancer in her father, maternal aunt, and mother; Heart disease in her father and mother.  ROS:  Please see the history of present illness.    All other systems are reviewed and otherwise negative.   PHYSICAL EXAM:  VS:  BP 132/80   Pulse 68   Ht 5' 7"  (1.702 m)   Wt 201 lb (91.2 kg)   SpO2 96%   BMI 31.48 kg/m  BMI: Body mass index is 31.48 kg/m. Well nourished, well developed, in no acute  distress HEENT: normocephalic, atraumatic Neck: no JVD, carotid bruits or masses Cardiac:  irreg-irreg; no significant murmurs, no rubs, or gallops Lungs:  CTA b/l, no wheezing, rhonchi or rales Abd: soft, nontender MS: no deformity or atrophy Ext: she has support stockings on, I do not appreciate any pitting edema Skin: warm and dry, no rash Neuro:  No gross deficits appreciated Psych: euthymic mood, full affect   EKG:  Done 10/20/21 and reviewed by myself shows  Afib 111bpm  Echo 08/04/21  1. Left ventricular ejection fraction, by estimation, is 60 to 65%. The  left ventricle has normal function. The left ventricle has no regional  wall motion abnormalities. Left ventricular diastolic parameters were  normal.   2. Right ventricular systolic function is normal. The right ventricular  size is normal. There is normal pulmonary artery systolic pressure.   3. Right atrial size was mildly dilated.   4. The mitral valve is normal in structure. No evidence of mitral valve  regurgitation. No evidence of mitral stenosis.   5. The aortic valve is normal in structure. Aortic valve regurgitation is  mild. No aortic stenosis is present.   6. There is mild dilatation of the ascending aorta, measuring 39 mm.   7. The inferior vena cava is normal in size with greater than 50%  respiratory variability, suggesting right atrial pressure of 3 mmHg.     Recent Labs: 10/05/2021: Magnesium 1.9; TSH 3.516 10/20/2021: ALT 23; B Natriuretic Peptide 621.8; BUN 12; Creatinine, Ser 1.23; Hemoglobin 12.7; Platelets 381; Potassium 4.4; Sodium 127  No results found for requested labs within last 365 days.   Estimated Creatinine Clearance: 42.3 mL/min (A) (by C-G formula based on SCr of 1.23 mg/dL (H)).   Wt Readings from Last 3 Encounters:  10/28/21 201 lb (91.2 kg)  10/20/21 201 lb (91.2 kg)  10/14/21 201 lb (91.2 kg)  Other studies reviewed: Additional studies/records reviewed today include:  summarized above  ASSESSMENT AND PLAN:  New AFib CHA2DS2Vasc is 3 Auscultated HR 96, she remains in Afib by exam  Dysautonomia/orthostatic hypotension The patient today was able to lift her feet up to the wheelchair foot rests under her own power and lean forward, adjust in her chair by herself, sounds like has been worse at home, unable to roll over in bed on her own in bed I did not pursue orthostatic vitals today I think her overal picture is multifactorial and the Afib not likey to be helping Rate is OK today Poor oral intake Failure to thrive, functional decline Advanced age Hyponatremia (repeat labs today)  Long discussion today They will reach out to PMD again regarding difficulty swallowing/emptying food bolus to stomach, nutrition, urged to eat/drink whatever she can They would like a palliative consult for goals of care I do not think we have a fix for her pending repeat labs Thigh compression may help, abdominal binder when OOB    Disposition: F/u with Dr. Caryl Comes as scheduled, they are very much looking forward to his recommendations.  Current medicines are reviewed at length with the patient today.  The patient did not have any concerns regarding medicines.  Venetia Night, PA-C 10/28/2021 2:27 PM     [address] [place] [id number] [phone] (office)  [phone] (fax)

## 2021-10-28 ENCOUNTER — Encounter: Payer: Self-pay | Admitting: Physician Assistant

## 2021-10-28 ENCOUNTER — Ambulatory Visit: Payer: PPO | Admitting: Physician Assistant

## 2021-10-28 ENCOUNTER — Other Ambulatory Visit (HOSPITAL_BASED_OUTPATIENT_CLINIC_OR_DEPARTMENT_OTHER): Payer: Self-pay

## 2021-10-28 ENCOUNTER — Ambulatory Visit: Payer: PPO | Admitting: Neurology

## 2021-10-28 VITALS — BP 132/80 | HR 68 | Ht 67.0 in | Wt 201.0 lb

## 2021-10-28 DIAGNOSIS — I951 Orthostatic hypotension: Secondary | ICD-10-CM

## 2021-10-28 DIAGNOSIS — G901 Familial dysautonomia [Riley-Day]: Secondary | ICD-10-CM | POA: Diagnosis not present

## 2021-10-28 DIAGNOSIS — Z79899 Other long term (current) drug therapy: Secondary | ICD-10-CM

## 2021-10-28 DIAGNOSIS — R55 Syncope and collapse: Secondary | ICD-10-CM | POA: Diagnosis not present

## 2021-10-28 DIAGNOSIS — I4819 Other persistent atrial fibrillation: Secondary | ICD-10-CM

## 2021-10-28 NOTE — Patient Instructions (Signed)
Medication Instructions:   Your physician recommends that you continue on your current medications as directed. Please refer to the Current Medication list given to you today.  *If you need a refill on your cardiac medications before your next appointment, please call your pharmacy*   Lab Work:  BMET TODAY    If you have labs (blood work) drawn today and your tests are completely normal, you will receive your results only by: MyChart Message (if you have MyChart) OR A paper copy in the mail If you have any lab test that is abnormal or we need to change your treatment, we will call you to review the results.   Testing/Procedures: NONE ORDERED  TODAY     Follow-Up: At [facility], you and your health needs are our priority.  As part of our continuing mission to provide you with exceptional heart care, we have created designated Provider Care Teams.  These Care Teams include your primary Cardiologist (physician) and Advanced Practice Providers (APPs -  Physician Assistants and Nurse Practitioners) who all work together to provide you with the care you need, when you need it.  We recommend signing up for the patient portal called "MyChart".  Sign up information is provided on this After Visit Summary.  MyChart is used to connect with patients for Virtual Visits (Telemedicine).  Patients are able to view lab/test results, encounter notes, upcoming appointments, etc.  Non-urgent messages can be sent to your provider as well.   To learn more about what you can do with MyChart, go to [email or web address].    Your next appointment:  AS SCHEDULED   The format for your next appointment:   In Person  Provider:   Sherryl Manges, MD    Other Instructions   Important Information About Sugar

## 2021-10-29 ENCOUNTER — Telehealth: Payer: Self-pay | Admitting: *Deleted

## 2021-10-29 LAB — BASIC METABOLIC PANEL
BUN/Creatinine Ratio: 11 — ABNORMAL LOW (ref 12–28)
BUN: 13 mg/dL (ref 8–27)
CO2: 22 mmol/L (ref 20–29)
Calcium: 8.8 mg/dL (ref 8.7–10.3)
Chloride: 90 mmol/L — ABNORMAL LOW (ref 96–106)
Creatinine, Ser: 1.19 mg/dL — ABNORMAL HIGH (ref 0.57–1.00)
Glucose: 109 mg/dL — ABNORMAL HIGH (ref 70–99)
Potassium: 5.5 mmol/L — ABNORMAL HIGH (ref 3.5–5.2)
Sodium: 124 mmol/L — ABNORMAL LOW (ref 134–144)
eGFR: 46 mL/min/{1.73_m2} — ABNORMAL LOW (ref >59)

## 2021-10-29 NOTE — Telephone Encounter (Signed)
Spoke with patient Dtr and aware of results and recommendations. Dtr to stop patient Potassium and await a call back from [place] once talked to primary NP Katrinka Blazing. Dtr stated patient has been feeling okay so far today. Last night patient ate a little more food and is staying down. No major complaints Dtr stated.

## 2021-10-29 NOTE — Telephone Encounter (Signed)
-----   Message from [facility], [place] sent at 10/29/2021  2:23 PM EDT ----- Please reach out to patient's daughter.  Labs are abnormal Sodium is lower and her potassium is actually a bit high. Stop potassium for now. We will reach out to her once I hear back from her PMD (we keep missing each other) If her Mom is at all feeling worse, I would recommend going to the hospital to get her electrolytes/medicines, trouble eating,  settled out.

## 2021-11-02 ENCOUNTER — Other Ambulatory Visit (HOSPITAL_BASED_OUTPATIENT_CLINIC_OR_DEPARTMENT_OTHER): Payer: Self-pay

## 2021-11-02 ENCOUNTER — Other Ambulatory Visit: Payer: Self-pay | Admitting: Neurology

## 2021-11-03 ENCOUNTER — Other Ambulatory Visit (HOSPITAL_BASED_OUTPATIENT_CLINIC_OR_DEPARTMENT_OTHER): Payer: Self-pay

## 2021-11-03 MED ORDER — BUPROPION HCL ER (XL) 150 MG PO TB24
150.0000 mg | ORAL_TABLET | Freq: Every morning | ORAL | 0 refills | Status: DC
  Filled 2021-11-03: qty 30, 30d supply, fill #0

## 2021-11-04 ENCOUNTER — Other Ambulatory Visit (HOSPITAL_BASED_OUTPATIENT_CLINIC_OR_DEPARTMENT_OTHER): Payer: Self-pay

## 2021-11-04 ENCOUNTER — Ambulatory Visit: Payer: PPO | Admitting: Neurology

## 2021-11-04 ENCOUNTER — Encounter: Payer: Self-pay | Admitting: Neurology

## 2021-11-04 ENCOUNTER — Encounter: Payer: Self-pay | Admitting: Family

## 2021-11-04 VITALS — BP 152/86 | HR 82 | Ht 67.0 in

## 2021-11-04 DIAGNOSIS — G43709 Chronic migraine without aura, not intractable, without status migrainosus: Secondary | ICD-10-CM | POA: Diagnosis not present

## 2021-11-04 DIAGNOSIS — G909 Disorder of the autonomic nervous system, unspecified: Secondary | ICD-10-CM | POA: Diagnosis not present

## 2021-11-04 DIAGNOSIS — I951 Orthostatic hypotension: Secondary | ICD-10-CM

## 2021-11-04 MED ORDER — VITAMIN D (ERGOCALCIFEROL) 1.25 MG (50000 UNIT) PO CAPS
50000.0000 [IU] | ORAL_CAPSULE | ORAL | 0 refills | Status: AC
  Filled 2021-11-04: qty 5, 35d supply, fill #0

## 2021-11-04 MED ORDER — FLUDROCORTISONE ACETATE 0.1 MG PO TABS
0.1000 mg | ORAL_TABLET | Freq: Two times a day (BID) | ORAL | 3 refills | Status: AC
Start: 2021-11-04 — End: 2022-11-04
  Filled 2021-11-04: qty 180, 90d supply, fill #0

## 2021-11-04 MED ORDER — TIZANIDINE HCL 2 MG PO TABS
2.0000 mg | ORAL_TABLET | Freq: Three times a day (TID) | ORAL | 3 refills | Status: AC | PRN
Start: 2021-11-04 — End: 2022-11-04
  Filled 2021-11-04: qty 60, 20d supply, fill #0

## 2021-11-04 MED ORDER — ONDANSETRON 8 MG PO TBDP
8.0000 mg | ORAL_TABLET | Freq: Three times a day (TID) | ORAL | 5 refills | Status: AC | PRN
  Filled 2021-11-04: qty 30, 10d supply, fill #0

## 2021-11-04 MED ORDER — METOCLOPRAMIDE HCL 5 MG PO TABS
5.0000 mg | ORAL_TABLET | Freq: Three times a day (TID) | ORAL | 6 refills | Status: AC | PRN
Start: 2021-11-04 — End: ?
  Filled 2021-11-04: qty 90, 30d supply, fill #0

## 2021-11-04 NOTE — Progress Notes (Unsigned)
PATIENT: Mallory Durham DOB: 03-31-1941  Chief Complaint  Patient presents with   Follow-up    Rm 17. Accompanied by daughter. Increased confusion, restlessness, anxiousness, extremity swelling, and fatigue.     HISTORICAL  Mallory Durham is a 81 year old female, seen in refer by primary care PA Fortino Sic, for evaluation of migraine headaches, chronic neck pain, initial evaluation was on June 01, 2017.  Reviewed and summarized the referring note, she has past medical history of hypothyroidism, on supplement, hyperlipidemia, chronic neck pain, chronic renal insufficiency,  Since 2016, she noticed progressive worsening dizziness, to the point now, she sits down most of the time, is not able to exercise, in addition, she is under a lot of stress, tried to see her husband at the nursing home on a daily basis.  She drinks 2 of 12 ounce of water bottle every day, but she complains lightheadedness shortly after standing up, mild improvement walking short distance, but with prolonged walking and standing, she would develop this washout sensation, lightheadedness, as if she is going to faint, she has to sit down to improve her symptoms.  During today's examination, she was found to have significant orthostatic blood pressure change in the setting of already low blood pressure, lying down blood pressure 97/70, heart rate of 83, standing up systolic blood pressure was 50 or less, required multiple measurement to register, she does become symptomatic, especially after standing up for more than 3 minutes.  Her symptoms gradually improved after lying flat.  Previously she was given prescription of midodrine 2.5 mg to get in the early morning, and before bed, there was no significant improvement noted.  She also noted gradual onset memory loss, difficulty focusing, she has history of migraine headache, increased migraine over the past few months, she could not tolerate Imitrex 100 mg as  needed due to heart palpitation, she usually take half of 100 mg tablets, if needed, she may repeat in couple hours, which take away the headache in few hours,  She is quite disabled by her dizziness, has to stay seated most of the time, she denies bilateral lower extremity swelling, no significant sensory loss.  UPDATE July 18 2017: MRI of the brain in February 2019, generalized atrophy, especially at the right perisylvian fissure area, mild supratentorium small vessel disease, there is no acute abnormality.  Laboratory evaluation showed A1c of 5.2, normal negative ANA, copper, C-reactive protein, ESR, RPR, B12, CMP, lipid profile, BNP was elevated 246   Significant low vitamin D 5.2,  Echocardiogram showed ejection fraction of 55-60%, wall motion was normal, aortic valve showed no stenosis, ventricular size was normal, systolic function was normal  She is taking gabapentin 679m bid, midodrine 10 mg 3 times a day, last dose was before she goes to bed, she also complains of chronic insomnia, taking Ambien 10 mg at nighttime  She still has depression, anxiety, she has frequent headaches, once or twice each day, she has been 50 mg as needed, 50 mg or Maxalt as needed for headaches, which helps her headache some  UPDATE November 01 2017: She is accompanied by her daughter [name] today's clinical visit, functional status has much declined, despite midodrine 10 mg 3 times a day, and the Mestinon 60 mg 3 times a day, she complains of lightheadedness, dizziness when getting up just a few minutes, today she had significant orthostatic pressure dropped [id number] systolic 60 after standing up, difficult to register diastolic pressure.   She complains lightheadedness fainting  sensation after lying down for a few minutes,blood pressure points back to 120/80, her symptoms has much improved  She also complains of frequent migraine headaches, sometimes woke up with migraine headaches, taking Imitrex as  needed  Chronic insomnia, Ambien 5 to 10 mg as needed  UPDATE Sept 9 2019: She is now taking fludrocortisone 0.1 mg 3 times a day, which has really helped her symptoms, along with Mestinon 60 mg daily, she is scheduled to see [facility] on February 07, 2018,  She complains of chronic migraine headaches for many years,3-4 times each week, right lateralized severe pounding headaches, with associated light noise sensitivity, Imitrex 100 mg as needed was helpful, still her headache last about 6 hours,  UPDATE May 02 2018: She is now taking fludrocortisone 0.3 mg 3 times a day, Mestinon 60 mg 3 times a day, midodrine was increased from 2.5 to 5 mg 3 times a day, orthostatic symptoms has much improved,  She has almost daily headache, more on right temporal region, imitrex $RemoveBeforeD'100mg'[id number]$  1/2 tab daily,   Reported 40% improvement with Botox injection, she has less headache, less severe, but benefit short lasting,  UPDATE Apr 02 2020: She suffered COVID-19 in early October 2021, recovering well, still complains of significant fatigue, orthostatic dizziness, fainting spells,  Reviewed evaluation by cardiologist Dr. Virl Axe on December 25, 2019, agree with current medication management, no longer take Mestinon, f still on fludrocortisone 0.1 mg 3 times a day, midodrine 10 mg 2-3 times a day,  Skin itching is well managed by gabapentin 300 mg 3 times a day, only occasionally Benadryl,  Migraine headache is overall under good control, Botox has really helped,  UPDATE Jul 02 2020: She complains of worsening orthostatic hypotension, dizziness, taking midodrine 10 mg twice a day, today sitting blood pressure 130/80, standing up 90/60  She continue have frequent headaches but improved with Botox injection, it is less frequent, easier to treat,  In addition, she reported 1 episode of sudden onset of dizziness, vertigo, nausea, double vision, followed by headache, lasting for 5 hours, this  happened 2 to 3 weeks ago, now she is back to her baseline, had history of similar occurrence in the past,  Update October 29, 2020: She is accompanied by her daughter, reported worsening headaches, worsening dizziness, lack of stamina, take frequent Excedrin Migraine, Botox as migraine prevention still works for her, but still the always 2-3 migraine each week,  UPDATE Sept 14 2022: She continues to decline slowly, spends most of the time sitting down, has frequent headaches, more than 20 days single month, despite Botox injection as preventive medication,  Continue have significant orthostatic hypotension,  Update April 22, 2021 She is accompanied by daughter, came in wheelchair today, she continues to have intermittent headache especially at the end of 3 months from previous Botox injection, 1-2 headaches a week, frequent dizziness result of static blood pressure change  UPDATE July 22 2021: Her headache is overall under good control using Botox injection, but she continue to decline, has worsening functional status, significant orthostatic hypotension, poor appetite, barely able to walk any extended length of time, spent most of the time in sitting position, she enjoys reading,  She had 1 episode of sudden onset left-sided blurry vision with associated lightheadedness in February 2023, last about 18 hours  UPDATE November 04 2021: New oset A fib, tired, could not move, on Oct 05 2021, metoprolol,   Hemo ono, occult was positive,   August 03 2021,  Hg 8.2, iron infusion,   No migraine headache was fine.    REVIEW OF SYSTEMS: Full 14 system review of systems performed and notable only for above  All rest review of system were negative  ALLERGIES: No Known Allergies   HOME MEDICATIONS:  PAST MEDICAL HISTORY:     Past Medical History:  Diagnosis Date   2019 novel coronavirus disease (COVID-19)     Anxiety     Confusion, hx of, without neuro findings     Depression     Hx of  vertigo     Hyperlipidemia     Hypothyroid     Left shoulder pain     Migraines     Neck pain     Orthostatic hypotension     Renal insufficiency        PAST SURGICAL HISTORY:      Past Surgical History:  Procedure Laterality Date   ABDOMINAL HYSTERECTOMY       ABDOMINAL HYSTERECTOMY       BREAST LUMPECTOMY       CHOLECYSTECTOMY       JOINT REPLACEMENT       THYROID SURGERY       TONSILLECTOMY          FAMILY HISTORY:      Family History  Problem Relation Age of Onset   Heart disease Mother          Angina   Cancer Mother     Cancer Father     Heart disease Father     Cancer Maternal Aunt        SOCIAL HISTORY:   Social History         Socioeconomic History   Marital status: Married      Spouse name: Not on file   Number of children: 2   Years of education: 16   Highest education level: Bachelor's degree (e.g., BA, AB, BS)  Occupational History      Employer: RETIRED  Tobacco Use   Smoking status: Never   Smokeless tobacco: Never  Vaping Use   Vaping Use: Never used  Substance and Sexual Activity   Alcohol use: No   Drug use: No   Sexual activity: Not on file  Other Topics Concern   Not on file  Social History Narrative    Lives at home alone.    Right-handed.    1-2 cups caffeine per day.    Social Determinants of Health    Financial Resource Strain: Not on file  Food Insecurity: Not on file  Transportation Needs: Not on file  Physical Activity: Not on file  Stress: Not on file  Social Connections: Not on file  Intimate Partner Violence: Not on file    PHYSICAL EXAMNIATION:  Blood pressure sitting down 160/110, 78; standing up 110/70, 85  Gen: NAD, conversant, well nourised, well groomed                     Cardiovascular: Regular rate rhythm, no peripheral edema, warm, nontender. Eyes: Conjunctivae clear without exudates or hemorrhage Neck: Supple, no carotid bruits. Pulmonary: Clear to auscultation bilaterally   NEUROLOGICAL  EXAM:  MENTAL STATUS: Speech/Cognition: Awake, alert, normal speech, oriented to history taking and casual conversation.  CRANIAL NERVES: CN II: Visual fields are full to confrontation.  Pupils are round equal and briskly reactive to light. CN III, IV, VI: extraocular movement are normal. No ptosis. CN V: Facial sensation is intact to light touch. CN  VII: Face is symmetric with normal eye closure and smile. CN VIII: Hearing is normal to casual conversation CN IX, X: Palate elevates symmetrically. Phonation is normal. CN XI: Head turning and shoulder shrug are intact CN XII: Tongue is midline with normal movements and no atrophy.  MOTOR: Muscle bulk and tone are normal. Muscle strength is normal.  REFLEXES: Reflexes are 2  and symmetric at the biceps, triceps, knees and ankles. Plantar responses are flexor.  SENSORY: Intact to light touch, pinprick, positional and vibratory sensation at fingers and toes.  COORDINATION: There is no trunk or limb ataxia.    GAIT/STANCE: Need push-up to get up from seated position, lightheaded with prolonged standing, rely on her walker  DIAGNOSTIC DATA (LABS, IMAGING, TESTING) - I reviewed patient records, labs, notes, testing and imaging myself where available.   ASSESSMENT AND PLAN  Mallory Durham is a 81 y.o. female   Autonomic failure  With significant orthostatic blood pressure changes,  No treatable etiology found, most consistent with central nervous system degenerative disorder,  She also reported occasional pseudobulbar phenomena  Patient is severely symptomatic from her profound autonomic failure, significant orthostatic blood pressure changes, essentially disabled from her symptoms,   Midodrine 91m tid, dose should be before 3 PM to avoid supine hypertension, continue fludrocortisone 0.1 mg 3 times a day, emphasized importance of increased water intake,  Add  on Mestinon 60 mg 3 times a day Chronic constipation frequent  nausea  Zofran as needed  Laxative as needed  Acute onset of blurry vision of left eye in 2023  Concerning possible TIA,  Complete evaluation echocardiogram, and ultrasound of carotid artery  Chronic migraine headaches  Imitrex 100 mg as needed  Botox injection for chronic migraine prevention, injection was performed according to [place] protocol,  5 units of Botox was injected into each side, for 31 injection sites, total of 155 units  Bilateral frontalis 4 injection sites Bilateral frontalis 2 injection sites Procerus 1 injection site Bilateral temporalis 8 injection sites Bilateral occipitalis 6 injection sites Bilateral cervical paraspinals 4 injection sites Bilateral upper trapezius 6 injection sites  Extra 45 unites were injected into bilateral masseters, and the right temporoparietal region and bilateral levator scapular  High co-pay with CGRP antagonist, did respond well to Imitrex, limit the use  YMarcial Pacas M.D. Ph.D.  [facility] [address] [place] [address]Ph: [phone]: [phone] CC: WFortino Sic PUtah

## 2021-11-05 ENCOUNTER — Other Ambulatory Visit (HOSPITAL_BASED_OUTPATIENT_CLINIC_OR_DEPARTMENT_OTHER): Payer: Self-pay

## 2021-11-06 ENCOUNTER — Telehealth: Payer: Self-pay | Admitting: Neurology

## 2021-11-06 ENCOUNTER — Other Ambulatory Visit (HOSPITAL_BASED_OUTPATIENT_CLINIC_OR_DEPARTMENT_OTHER): Payer: Self-pay

## 2021-11-06 ENCOUNTER — Telehealth: Payer: Self-pay | Admitting: Internal Medicine

## 2021-11-06 MED ORDER — METOPROLOL SUCCINATE ER 25 MG PO TB24: ORAL_TABLET | ORAL | 3 refills | Status: AC

## 2021-11-06 MED ORDER — GABAPENTIN 100 MG PO CAPS
300.0000 mg | ORAL_CAPSULE | Freq: Three times a day (TID) | ORAL | 5 refills | Status: AC | PRN
  Filled 2021-11-06: qty 180, 20d supply, fill #0

## 2021-11-06 NOTE — Telephone Encounter (Signed)
Per Rudi Coco NP can try increasing metoprolol to 12.5mg  in the AM and 25mg  in the PM. If hypotension then go back to 25mg  a day. Daughter in agreement.

## 2021-11-06 NOTE — Telephone Encounter (Signed)
Spoke with pt's daughter to schedule colon and EGD at [facility] on August 10th. Pt's daughter states that around 5/27 pt began having difficulty keeping food and liquids down and thinks pt would be unable to complete colonoscopy prep. Daughter stated that pt swallows food and liquids without difficulty initially but once food or liquid reaches her mid chest pt spits it back up. Daughter also stated it's like pt is having spasms. Pt is able to get down medications crushed in pudding without difficulty. Spitting up happens especially with liquids. Daughter believes pt is able to keep down enough liquids to maintain adequate hydration but pt gets full very quickly. When this started daughter started giving pt Nexium 20 mg at night because in the morning the patient takes synthroid. Daughter wanted to make sure it was okay that she was giving pt nexium. Daughter also since office visit pt has been diagnosed with new onset Afib but is not on a blood thinner due to age and risk of falls. Pt just taking beta blocker.

## 2021-11-09 ENCOUNTER — Other Ambulatory Visit: Payer: Self-pay

## 2021-11-09 ENCOUNTER — Telehealth: Payer: Self-pay

## 2021-11-09 DIAGNOSIS — R1310 Dysphagia, unspecified: Secondary | ICD-10-CM

## 2021-11-09 DIAGNOSIS — R6881 Early satiety: Secondary | ICD-10-CM

## 2021-11-09 DIAGNOSIS — R630 Anorexia: Secondary | ICD-10-CM

## 2021-11-09 NOTE — Telephone Encounter (Addendum)
Left message for pt's daughter to call back.

## 2021-11-09 NOTE — Telephone Encounter (Signed)
Left message for pt's daughter to call back.

## 2021-11-10 ENCOUNTER — Emergency Department (HOSPITAL_BASED_OUTPATIENT_CLINIC_OR_DEPARTMENT_OTHER)
Admission: EM | Admit: 2021-11-10 | Discharge: 2021-11-10 | Disposition: A | Discharge status: Home / Self Care | Payer: PPO | Attending: Emergency Medicine | Admitting: Emergency Medicine

## 2021-11-10 ENCOUNTER — Encounter (HOSPITAL_BASED_OUTPATIENT_CLINIC_OR_DEPARTMENT_OTHER): Payer: Self-pay

## 2021-11-10 ENCOUNTER — Emergency Department (HOSPITAL_BASED_OUTPATIENT_CLINIC_OR_DEPARTMENT_OTHER): Payer: PPO

## 2021-11-10 DIAGNOSIS — R0602 Shortness of breath: Secondary | ICD-10-CM | POA: Insufficient documentation

## 2021-11-10 DIAGNOSIS — I4891 Unspecified atrial fibrillation: Secondary | ICD-10-CM | POA: Diagnosis not present

## 2021-11-10 DIAGNOSIS — E860 Dehydration: Secondary | ICD-10-CM | POA: Diagnosis not present

## 2021-11-10 DIAGNOSIS — R1310 Dysphagia, unspecified: Secondary | ICD-10-CM

## 2021-11-10 DIAGNOSIS — Z79899 Other long term (current) drug therapy: Secondary | ICD-10-CM | POA: Diagnosis not present

## 2021-11-10 LAB — COMPREHENSIVE METABOLIC PANEL
ALT: 21 U/L (ref 0–44)
AST: 23 U/L (ref 15–41)
Albumin: 2.7 g/dL — ABNORMAL LOW (ref 3.5–5.0)
Alkaline Phosphatase: 96 U/L (ref 38–126)
Anion gap: 8 (ref 5–15)
BUN: 14 mg/dL (ref 8–23)
CO2: 26 mmol/L (ref 22–32)
Calcium: 8.8 mg/dL — ABNORMAL LOW (ref 8.9–10.3)
Chloride: 101 mmol/L (ref 98–111)
Creatinine, Ser: 1.23 mg/dL — ABNORMAL HIGH (ref 0.44–1.00)
GFR, Estimated: 44 mL/min — ABNORMAL LOW (ref >60)
Glucose, Bld: 125 mg/dL — ABNORMAL HIGH (ref 70–99)
Potassium: 3.9 mmol/L (ref 3.5–5.1)
Sodium: 135 mmol/L (ref 135–145)
Total Bilirubin: 0.6 mg/dL (ref 0.3–1.2)
Total Protein: 6.4 g/dL — ABNORMAL LOW (ref 6.5–8.1)

## 2021-11-10 LAB — CBC
HCT: 40.9 % (ref 36.0–46.0)
Hemoglobin: 13.2 g/dL (ref 12.0–15.0)
MCH: 29.7 pg (ref 26.0–34.0)
MCHC: 32.3 g/dL (ref 30.0–36.0)
MCV: 91.9 fL (ref 80.0–100.0)
Platelets: 332 10*3/uL (ref 150–400)
RBC: 4.45 MIL/uL (ref 3.87–5.11)
RDW: 17.9 % — ABNORMAL HIGH (ref 11.5–15.5)
WBC: 9.9 10*3/uL (ref 4.0–10.5)
nRBC: 0 % (ref 0.0–0.2)

## 2021-11-10 LAB — CBG MONITORING, ED: Glucose-Capillary: 121 mg/dL — ABNORMAL HIGH (ref 70–99)

## 2021-11-10 LAB — MAGNESIUM: Magnesium: 2 mg/dL (ref 1.7–2.4)

## 2021-11-10 MED ORDER — SODIUM CHLORIDE 0.9 % IV BOLUS
1000.0000 mL | Freq: Once | INTRAVENOUS | Status: AC
  Administered 2021-11-10: 1000 mL via INTRAVENOUS

## 2021-11-10 MED ORDER — ONDANSETRON HCL 4 MG/2ML IJ SOLN
4.0000 mg | Freq: Once | INTRAMUSCULAR | Status: AC
  Administered 2021-11-10: 4 mg via INTRAVENOUS
  Filled 2021-11-10: qty 2

## 2021-11-10 NOTE — ED Notes (Signed)
Assisted to [facility].

## 2021-11-10 NOTE — Telephone Encounter (Signed)
Left message for pt's daughter to call back.

## 2021-11-10 NOTE — ED Provider Notes (Signed)
[facility]ENCY DEPARTMENT Provider Note   CSN: [id number] Arrival date & time: 11/10/21  1119     History Chief Complaint  Patient presents with   Vomiting    Mallory Durham is a 81 y.o. female with history of atrial fibrillation not on anticoagulation, autonomic dysfunction, and orthostatic hypotension who presents to the emergency department today with decreased p.o. intake and urinary output.  Daughter is at bedside and provides most of the history.  She states that over the last couple of weeks she has been having some dysphagia and trouble tolerating both solids and liquids.  She has been having trouble taking her medications as well.  She states that when she drinks or eats anything it feels like it gets stuck in the mid substernal chest and sometimes it is able to go down but most the time she ends up regurgitating.  Patient has been in contact with her gastroenterologist who has an EGD scheduled for August 10.  The daughter noticed over the last couple of days the patient has had a dry depens more than normal.  Daughter also mentions that her atrial fibrillation has been more symptomatic and this morning her heart rate was in the 130s.  The daughter gave her 12.5 of her beta-blocker and 1/2 mg of Xanax and came to the emergency department for further evaluation.  Patient complains of shortness of breath and palpitations but denies chest pain, abdominal pain, nausea, vomiting, diarrhea.   HPI     Home Medications Prior to Admission medications   Medication Sig Start Date End Date Taking? Authorizing Provider  aspirin-acetaminophen-caffeine (EXCEDRIN MIGRAINE) [phone] MG tablet Take by mouth every 6 (six) hours as needed for headache.    [provider]  botulinum toxin Type A (BOTOX) 100 units SOLR injection Inject IM every 3 months by MD in the office 12/07/17   Marcial Pacas, MD  buPROPion (WELLBUTRIN XL) 150 MG 24 hr tablet Take 1 tablet (150 mg total) by  mouth every morning. 11/03/21     diphenhydrAMINE (BENADRYL) 25 mg capsule Take 25 mg by mouth every 6 (six) hours as needed.    [provider]  fludrocortisone (FLORINEF) 0.1 MG tablet Take 1 tablet (0.1 mg total) by mouth 2 (two) times daily. 11/04/21 11/04/22  Marcial Pacas, MD  gabapentin (NEURONTIN) 100 MG capsule Take 3 capsules (300 mg total) by mouth 3 (three) times daily as needed. 11/06/21   Marcial Pacas, MD  metoCLOPramide (REGLAN) 5 MG tablet Take 1 tablet (5 mg total) by mouth every 8 (eight) hours as needed for nausea. 11/04/21   Marcial Pacas, MD  metoprolol succinate (TOPROL XL) 25 MG 24 hr tablet Take 1/2 tablet in the am and 1 tablet in the pm 11/06/21   Sherran Needs, NP  midodrine (PROAMATINE) 10 MG tablet Take 1 tablet (10 mg total) by mouth 3 (three) times daily. 01/16/21   Marcial Pacas, MD  Multiple Vitamins-Minerals (PRESERVISION AREDS 2) CAPS Take 2 capsules by mouth.    [provider]  nystatin cream (MYCOSTATIN) Apply topically 2 (two)  times daily as needed ((yeast on skin) (EXTERNAL/TOPICAL USE ONLY)). 10/20/21     ondansetron (ZOFRAN-ODT) 8 MG disintegrating tablet Take 1 tablet (8 mg total) by mouth every 8 (eight) hours as needed for nausea or vomiting. 11/04/21   Marcial Pacas, MD  pravastatin (PRAVACHOL) 20 MG tablet Take 1 tablet (20 mg total) by mouth nightly. 06/19/21     Probiotic Product (PROBIOTIC PO) Take  1 capsule by mouth as directed. Patient not taking: Reported on 11/04/2021    [provider]  pyridostigmine (MESTINON) 60 MG tablet Take 1 tablet (60 mg total) by mouth 3 (three) times daily as needed. 07/22/21   Marcial Pacas, MD  sertraline (ZOLOFT) 100 MG tablet Take 1 tablet by mouth in the morning 06/19/21     Sod Picosulfate-Mag Ox-Cit Acd (CLENPIQ) 10-3.5-12 MG-GM -GM/160ML SOLN Take 1 kit by mouth as directed. Patient not taking: Reported on 11/04/2021 09/21/21   Cirigliano, Luanna Salk V, DO  sodium fluoride (SF 5000 PLUS) 1.1 % CREA dental cream Brush teeth  3 to 5 minutes at bedtime 06/01/21     SUMAtriptan (IMITREX) 100 MG tablet Take 1 tab at onset of migraine.  May repeat in 2 hrs, if needed.  Max dose: 2 tabs/day. This is a 30 day prescription. 07/22/21   Marcial Pacas, MD  SYNTHROID 88 MCG tablet Take 1 tablet (88 mcg total) by mouth daily before breakfast. 07/07/21   Philemon Kingdom, MD  tiZANidine (ZANAFLEX) 2 MG tablet Take 1 tablet (2 mg total) by mouth every 8 (eight) hours as needed for muscle spasms. 11/04/21 11/04/22  Marcial Pacas, MD  vitamin B-12 (CYANOCOBALAMIN) 1000 MCG tablet Take 1,000 mcg by mouth daily. Patient not taking: Reported on 11/04/2021    [provider]  Vitamin D, Ergocalciferol, (DRISDOL) 1.25 MG (50000 UNIT) CAPS capsule Take 1 capsule (50,000 Units total) by mouth every 7 (seven) days. 11/04/21   Marcial Pacas, MD      Allergies    Patient has no known allergies.    Review of Systems   Review of Systems  All other systems reviewed and are negative.   Physical Exam Updated Vital Signs BP (!) 137/120   Pulse 93   Temp 98 F (36.7 C)   Resp 20   Ht 5' 7"  (1.702 m)   Wt 91.2 kg   SpO2 94%   BMI 31.48 kg/m  Physical Exam Vitals and nursing note reviewed.  Constitutional:      General: She is not in acute distress.    Appearance: Normal appearance.  HENT:     Head: Normocephalic and atraumatic.  Eyes:     General:        Right eye: No discharge.        Left eye: No discharge.  Cardiovascular:     Rate and Rhythm: Tachycardia present. Rhythm regularly irregular.     Comments: S1/S2 are distinct without any evidence of murmur, rubs, or gallops.  Radial pulses are 2+ bilaterally.  Dorsalis pedis pulses are 2+ bilaterally.  No evidence of pedal edema. Pulmonary:     Comments: Clear to auscultation bilaterally.  Normal effort.  No respiratory distress.  No evidence of wheezes, rales, or rhonchi heard throughout. Abdominal:     General: Abdomen is flat. Bowel sounds are normal. There is no distension.      Tenderness: There is no abdominal tenderness. There is no guarding or rebound.  Musculoskeletal:        General: Normal range of motion.     Cervical back: Neck supple.  Skin:    General: Skin is warm and dry.     Findings: No rash.  Neurological:     General: No focal deficit present.     Mental Status: She is alert.  Psychiatric:        Mood and Affect: Mood normal.        Behavior: Behavior normal.  ED Results / Procedures / Treatments   Labs (all labs ordered are listed, but only abnormal results are displayed) Labs Reviewed  COMPREHENSIVE METABOLIC PANEL - Abnormal; Notable for the following components:      Result Value   Glucose, Bld 125 (*)    Creatinine, Ser 1.23 (*)    Calcium 8.8 (*)    Total Protein 6.4 (*)    Albumin 2.7 (*)    GFR, Estimated 44 (*)    All other components within normal limits  CBC - Abnormal; Notable for the following components:   RDW 17.9 (*)    All other components within normal limits  CBG MONITORING, ED - Abnormal; Notable for the following components:   Glucose-Capillary 121 (*)    All other components within normal limits  MAGNESIUM    EKG None  Radiology DG Chest 2 View  Result Date: 11/10/2021 CLINICAL DATA:  Shortness of breath EXAM: CHEST - 2 VIEW COMPARISON:  10/20/2021 FINDINGS: Cardiomegaly. Aortic atherosclerosis. Chronic hiatal hernia. Small bilateral pleural effusions with some worsening of basilar atelectasis. Upper lungs remain clear. IMPRESSION: Enlarging pleural effusions with worsening basilar atelectasis. Cardiomegaly and aortic atherosclerosis. Hiatal hernia. Electronically Signed   By: Nelson Chimes M.D.   On: 11/10/2021 13:06    Procedures Procedures    Medications Ordered in ED Medications  sodium chloride 0.9 % bolus 1,000 mL (0 mLs Intravenous Stopped 11/10/21 1451)  ondansetron (ZOFRAN) injection 4 mg (4 mg Intravenous Given 11/10/21 1417)    ED Course/ Medical Decision Making/ A&P Clinical Course  as of 11/10/21 1543  Tue Nov 10, 2021  1518 I spoke with Dr. Henrene Pastor with [facility] gastroenterology who is going to message the office and have the family contacted to expedite the barium swallow in addition to EGD studies. [CF]  1518 CBC(!) No evidence of leukocytosis or anemia. [CF]  1519 Comprehensive metabolic panel(!) Elevated glucose and creatinine but seems to be at baseline in comparison to previous studies. [CF]  1519 Magnesium Magnesium is normal. [CF]  1520 DG Chest 2 View I personally ordered and interpreted a chest x-ray which shows a large hiatal hernia and worsening pleural effusions.  I do agree with the radiologist interpretation. [CF]    Clinical Course User Index [CF] Hendricks Limes, PA-C                           Medical Decision Making Marjarie Irion is a 81 y.o. female patient who presents to the emerged apartment with dysphagia and decreased urinary output.  Given history I do feel that the patient is likely dehydrated.  She does appear clinically dehydrated on exam.  We will start with some labs to look for electrolyte abnormalities and add magnesium to see why the patient is having some symptomatic atrial fibrillation today.  We will also get a chest x-ray to see if there is any evidence of foreign body although unlikely given the time course.  And will plan to reassess.   Amount and/or Complexity of Data Reviewed External Data Reviewed: notes.    Details: Patient was seen evaluated by her PCP and gastroenterology within the last week.  Patient has EGD scheduled for 8/10 which is highlighted in HPI. Labs: ordered. Decision-making details documented in ED Course. Radiology: ordered and independent interpretation performed. Decision-making details documented in ED Course.  Risk Prescription drug management. Risk Details: Patient feeling much better after 1 L of fluid.  No evidence of AKI  or electrolyte abnormalities today.  Patient tolerating fluid challenge well  with no evidence of regurgitation of fluid.  Dr. Henrene Pastor with gastroenterology will expedite EGD and barium swallow study.  Notified patient and family of this at bedside.  They were quite relieved and pleased.  I discussed strict return precautions with them at the bedside.  They expressed full understanding.  Patient does not meet inpatient criteria at this time.  She is safe for discharge home.  Her heart rate has improved.   Final Clinical Impression(s) / ED Diagnoses Final diagnoses:  Dysphagia, unspecified type  Dehydration    Rx / DC Orders ED Discharge Orders     None         Hendricks Limes, [place] 11/10/21 1543    Lucrezia Starch, MD 11/11/21 2030

## 2021-11-11 ENCOUNTER — Other Ambulatory Visit: Payer: Self-pay | Admitting: Gastroenterology

## 2021-11-11 DIAGNOSIS — R1310 Dysphagia, unspecified: Secondary | ICD-10-CM

## 2021-11-11 NOTE — Telephone Encounter (Signed)
Patient's EGD at [facility] hospital has been moved to 11/19/21.    [facility] Pre-operative Risk Assessment     Request for surgical clearance:     Endoscopy Procedure  What type of surgery is being performed?     EGD  When is this surgery scheduled?     11/19/21  What type of clearance is required ?   Medical  Are there any medications that need to be held prior to surgery and how long? N/A  Practice name and name of physician performing surgery?      [facility]   What is your office phone and fax number?      Phone- [phone]  [phone]  Anesthesia type (None, local, MAC, general) ?       MAC

## 2021-11-12 ENCOUNTER — Other Ambulatory Visit: Payer: Self-pay

## 2021-11-12 ENCOUNTER — Ambulatory Visit: Payer: PPO | Admitting: Physician Assistant

## 2021-11-12 ENCOUNTER — Telehealth: Payer: Self-pay

## 2021-11-12 DIAGNOSIS — R6881 Early satiety: Secondary | ICD-10-CM

## 2021-11-12 DIAGNOSIS — R1310 Dysphagia, unspecified: Secondary | ICD-10-CM

## 2021-11-12 DIAGNOSIS — R630 Anorexia: Secondary | ICD-10-CM

## 2021-11-12 DIAGNOSIS — R110 Nausea: Secondary | ICD-10-CM

## 2021-11-12 NOTE — Telephone Encounter (Signed)
From: Shellia Cleverly, DO  Sent: 11/11/2021   7:39 AM EDT  To: Orion Modest, RN   Patient was seen in the ER medicine [facility] yesterday.  Was hemodynamically stable and did not meet admission criteria.  Can you please assist in the following:   - Please help schedule barium esophagram that was previously ordered.  Please try to coordinate to have this done ASAP  -Do we have any ability to expedite EGD at [facility]?  I would certainly be willing to do the procedure at 730 or lunch time if [facility] would give me a spot.  Can also try to do this on one of my inpatient weeks at [facility] End[facility] unit if that gets this done faster.

## 2021-11-12 NOTE — Telephone Encounter (Signed)
EGD scheduled for 11/19/21 at [facility]. Ambulatory referral placed and instructions faxed to pt's daughter, Angelique Blonder. Cardiac clearance requested. Barium Esophagram scheduled for 7/10 at 10:30 am. Per Dr. Barron Alvine esophagram can be done after EGD. Pt's daughter verbalized understanding and had no other concerns at end of call.

## 2021-11-13 ENCOUNTER — Other Ambulatory Visit: Payer: PPO

## 2021-11-18 ENCOUNTER — Ambulatory Visit (INDEPENDENT_AMBULATORY_CARE_PROVIDER_SITE_OTHER): Payer: PPO | Admitting: Internal Medicine

## 2021-11-18 ENCOUNTER — Encounter: Payer: Self-pay | Admitting: Internal Medicine

## 2021-11-18 VITALS — Ht 67.0 in

## 2021-11-18 DIAGNOSIS — I951 Orthostatic hypotension: Secondary | ICD-10-CM

## 2021-11-18 DIAGNOSIS — I4819 Other persistent atrial fibrillation: Secondary | ICD-10-CM

## 2021-11-18 MED ORDER — MIDODRINE HCL 10 MG PO TABS: 5.0000 mg | ORAL_TABLET | Freq: Two times a day (BID) | ORAL | 3 refills | Status: AC

## 2021-11-18 MED ORDER — PYRIDOSTIGMINE BROMIDE 60 MG PO TABS: 60.0000 mg | ORAL_TABLET | Freq: Three times a day (TID) | ORAL | 1 refills | Status: AC | PRN

## 2021-11-18 MED ORDER — PYRIDOSTIGMINE BROMIDE 60 MG PO TABS: 60.0000 mg | ORAL_TABLET | Freq: Two times a day (BID) | ORAL | 1 refills | Status: DC

## 2021-11-18 NOTE — Patient Instructions (Addendum)
Medication Instructions:  Your physician has recommended you make the following change in your medication:   ** Hold Fludrocortisone 0.1mg  x 2 weeks  ** Decrease Proamatine 10mg  to 1/2 tablet (5mg ) twice daily.  *If you need a refill on your cardiac medications before your next appointment, please call your pharmacy*   Lab Work: None ordered.  If you have labs (blood work) drawn today and your tests are completely normal, you will receive your results only by: MyChart Message (if you have MyChart) OR A paper copy in the mail If you have any lab test that is abnormal or we need to change your treatment, we will call you to review the results.   Testing/Procedures: None ordered.    Follow-Up: At [facility], you and your health needs are our priority.  As part of our continuing mission to provide you with exceptional heart care, we have created designated Provider Care Teams.  These Care Teams include your primary Cardiologist (physician) and Advanced Practice Providers (APPs -  Physician Assistants and Nurse Practitioners) who all work together to provide you with the care you need, when you need it.  We recommend signing up for the patient portal called "MyChart".  Sign up information is provided on this After Visit Summary.  MyChart is used to connect with patients for Virtual Visits (Telemedicine).  Patients are able to view lab/test results, encounter notes, upcoming appointments, etc.  Non-urgent messages can be sent to your provider as well.   To learn more about what you can do with MyChart, go to .    Your next appointment:   12 months with Dr [facility]  DNR completed  Important Information About Sugar

## 2021-11-18 NOTE — Progress Notes (Addendum)
Patient Care Team: Adolph Pollack, FNP as PCP - General (Family Medicine) Nahser, Deloris Ping, MD as PCP - Cardiology (Cardiology)   HPI  Mallory Durham is a 81 y.o. female seen in follow-up for newly identified atrial fibrillation 5/23.  I had seen more remotely 2021 on 1 occasion for orthostatic hypotension and history of syncope which had been improved with the use of fludrocortisone and some adjunctive Mestinon.  She had had shower intolerance, orthostatic intolerance   She carries a diagnosis of autonomic failure even suggested as far back as 2018 with a drop in blood pressure of 35 mm without a change in heart rate Evaluation for myeloma was negative  Developed a pneumonia 5/23 at urgent care was found to be in atrial fibrillation.  Apparently it was making symptoms worse i.e. weakness and fatigue although she was unaware of the arrhythmia itself.  Heart rates were in the low 100s and she was started on low-dose beta-blockers.  No change in blood pressure was noted by her very attentive daughter, Charity fundraiser.  Anticoagulation was recommended but was declined because of concern about falls.  She has also been identified as having stool guaiac positive iron deficiency anemia which has been treated with IV iron  Problems with dysphagia and poor intake has required a number of ER visits for fluids  EGD for tomorrow and here for clearance   Significant interval worsening of her lower extremity edema.  She is largely nonambulatory.  She can be transferred with some assistance.  Palliative care has recently been engaged  Autonomic symptoms include constipation, dry eyes dry mouth.      DATE TEST EF    2/19 Echo   55-60 %     3/23 Echo  60-65%               Date Cr K Hgb  2/21 1.08 4.9 13.1   6/23 1.23 3.92` 13.2       Records and Results Reviewed   Past Medical History:  Diagnosis Date   2019 novel coronavirus disease (COVID-19)    A-fib (HCC)    Anxiety    Confusion,  hx of, without neuro findings    Depression    Hx of vertigo    Hyperlipidemia    Hypothyroid    Left shoulder pain    Migraines    Neck pain    Orthostatic hypotension    Renal insufficiency     Past Surgical History:  Procedure Laterality Date   ABDOMINAL HYSTERECTOMY     ABDOMINAL HYSTERECTOMY     BREAST LUMPECTOMY     CHOLECYSTECTOMY     COLONOSCOPY     Done in [facility] [place] about 15 years ago   JOINT REPLACEMENT     THYROID SURGERY     TONSILLECTOMY      Current Meds  Medication Sig   aspirin-acetaminophen-caffeine (EXCEDRIN MIGRAINE) 250-250-65 MG tablet Take 1-2 tablets by mouth every 6 (six) hours as needed for headache.   botulinum toxin Type A (BOTOX) 100 units SOLR injection Inject IM every 3 months by MD in the office (Patient taking differently: Inject 1 dose every 3 months by MD in the office)   buPROPion (WELLBUTRIN XL) 150 MG 24 hr tablet Take 1 tablet (150 mg total) by mouth every morning.   diphenhydrAMINE (BENADRYL) 25 mg capsule Take 50 mg by mouth See admin instructions. Take 50 mg at night, may take 50 mg during the day as needed  for itching, migraine, insomina   esomeprazole (NEXIUM) 20 MG capsule Take 20 mg by mouth at bedtime.   fludrocortisone (FLORINEF) 0.1 MG tablet Take 1 tablet (0.1 mg total) by mouth 2 (two) times daily.   metoCLOPramide (REGLAN) 5 MG tablet Take 1 tablet (5 mg total) by mouth every 8 (eight) hours as needed for nausea. (Patient taking differently: Take 5 mg by mouth 3 (three) times daily before meals.)   metoprolol succinate (TOPROL XL) 25 MG 24 hr tablet Take 1/2 tablet in the am and 1 tablet in the pm   Multiple Vitamins-Minerals (PRESERVISION AREDS 2) CAPS Take 1 capsule by mouth 2 (two) times daily.   ondansetron (ZOFRAN-ODT) 8 MG disintegrating tablet Take 1 tablet (8 mg total) by mouth every 8 (eight) hours as needed for nausea or vomiting.   Polyethyl Glycol-Propyl Glycol (SYSTANE OP) Place 1 drop into both eyes daily  as needed (dry eyes).   pravastatin (PRAVACHOL) 20 MG tablet Take 1 tablet (20 mg total) by mouth nightly.   sertraline (ZOLOFT) 100 MG tablet Take 1 tablet by mouth in the morning   SUMAtriptan (IMITREX) 100 MG tablet Take 1 tab at onset of migraine.  May repeat in 2 hrs, if needed.  Max dose: 2 tabs/day. This is a 30 day prescription.   SYNTHROID 88 MCG tablet Take 1 tablet (88 mcg total) by mouth daily before breakfast.   tiZANidine (ZANAFLEX) 2 MG tablet Take 1 tablet (2 mg total) by mouth every 8 (eight) hours as needed for muscle spasms. (Patient taking differently: Take 2 mg by mouth See admin instructions. Take 2 mg at night, may take an additional 2 mg dose up to 2 times a day as needed for migraines)   Vitamin D, Ergocalciferol, (DRISDOL) 1.25 MG (50000 UNIT) CAPS capsule Take 1 capsule (50,000 Units total) by mouth every 7 (seven) days.   [DISCONTINUED] midodrine (PROAMATINE) 10 MG tablet Take 1 tablet (10 mg total) by mouth 3 (three) times daily. (Patient taking differently: Take 10 mg by mouth 2 (two) times daily.)   [DISCONTINUED] pyridostigmine (MESTINON) 60 MG tablet Take 1 tablet (60 mg total) by mouth 3 (three) times daily as needed.    No Known Allergies    Review of Systems negative except from HPI and PMH  Physical Exam Ht 5\' 7"  (1.702 m)   SpO2 96%   BMI 31.48 kg/m  Well developed and well nourished in no acute distress HENT normal E scleral and icterus clear Neck Supple JVP flat; carotids brisk and full Clear to ausculation Irregular rate and rhythm, no murmurs gallops or rub Soft with active bowel sounds No clubbing cyanosis 2+ Edema Alert and oriented, grossly normal motor and sensory function Skin Warm and Dry  ECG afib 98 -/08/36  Estimated Creatinine Clearance: 42.3 mL/min (A) (by C-G formula based on SCr of 1.23 mg/dL (H)).   Assessment and  Plan Autonomic failure  Dysphagia  Atrial fibrillation-persistent  Anemia with guaiac positive  stool  Preoperative consultation  End-of-life discussion  The patient carries a diagnosis of autonomic failure which is likely correct although the duration since 2018 upon which this diagnosis is found it is certainly longer than I have seen.  Still she has limiting orthostatic intolerance, GI autonomic issues.  Indeed I wonder whether her dysphagia may not be autonomic as well, I guess we will see tomorrow.  Her risks for her endoscopy are likely acceptable.  She will be supine so blood pressures will not be an  issue, she does not ambulate so residual orthostatic intolerance should not be an issue.  The use of conscious sedation should be associated with less hypotension and other anesthetics.  Hypotension may ensue and will require appropriate treatments.  Following this, I think she should be anticoagulated.  We reviewed the relative risks of falls versus stroke, and would begin anticoagulation with apixaban 5 mg twice daily with cardioversion in about 3-4 weeks thereafter.  Blood thinners could be stopped 4 weeks afterwards if the family felt strongly although I would be inclined towards continuing.  At the end of this visit the patient then said this is not a life I would like to live.  We then discussed DNR status.  I stressed not instrumental view of life.  Her daughter is designated as her healthcare power of attorney and they have had some discussions about this.  At her request I have written a DNR   91 min was spent in care of the patient including the review of records    Current medicines are reviewed at length with the patient today .  The patient does not  have concerns regarding medicines.

## 2021-11-18 NOTE — Telephone Encounter (Signed)
Per Dr Graciela Husbands:  she should be ok for her EGD tomorrow  Hypotension with vasodilitation would be my biggest concern -- I wonder whether her dysphagia could be related to autonomic issues -- also would like to start on her anticoagulation for her afib when you can give [place] the go ahead  thanks steve klein    Per Dr Barron Alvine: Thanks Brett Canales, really appreciate it. If her esophagus is otherwise normal appearing tomorrow, would be interesting to see about autonomic as a potential etiology. That's a great take on her! I'll keep you posted on what we find, and hopefully should be fine to start [facility] afterward

## 2021-11-19 ENCOUNTER — Other Ambulatory Visit: Payer: Self-pay

## 2021-11-19 ENCOUNTER — Ambulatory Visit (HOSPITAL_BASED_OUTPATIENT_CLINIC_OR_DEPARTMENT_OTHER): Payer: PPO | Admitting: Anesthesiology

## 2021-11-19 ENCOUNTER — Encounter (HOSPITAL_COMMUNITY): Payer: Self-pay | Admitting: Gastroenterology

## 2021-11-19 ENCOUNTER — Ambulatory Visit (HOSPITAL_COMMUNITY)
Admission: RE | Admit: 2021-11-19 | Discharge: 2021-11-19 | Disposition: A | Discharge status: Home / Self Care | Payer: PPO | Attending: Gastroenterology | Admitting: Gastroenterology

## 2021-11-19 ENCOUNTER — Encounter (HOSPITAL_COMMUNITY)
Admission: RE | Disposition: A | Discharge status: Home / Self Care | Payer: Self-pay | Source: Home / Self Care | Attending: Gastroenterology

## 2021-11-19 ENCOUNTER — Ambulatory Visit (HOSPITAL_COMMUNITY): Payer: PPO | Admitting: Anesthesiology

## 2021-11-19 DIAGNOSIS — Z79890 Hormone replacement therapy (postmenopausal): Secondary | ICD-10-CM | POA: Insufficient documentation

## 2021-11-19 DIAGNOSIS — R630 Anorexia: Secondary | ICD-10-CM

## 2021-11-19 DIAGNOSIS — K449 Diaphragmatic hernia without obstruction or gangrene: Secondary | ICD-10-CM | POA: Insufficient documentation

## 2021-11-19 DIAGNOSIS — K2970 Gastritis, unspecified, without bleeding: Secondary | ICD-10-CM

## 2021-11-19 DIAGNOSIS — K222 Esophageal obstruction: Secondary | ICD-10-CM

## 2021-11-19 DIAGNOSIS — E039 Hypothyroidism, unspecified: Secondary | ICD-10-CM | POA: Diagnosis not present

## 2021-11-19 DIAGNOSIS — E785 Hyperlipidemia, unspecified: Secondary | ICD-10-CM | POA: Diagnosis not present

## 2021-11-19 DIAGNOSIS — R6881 Early satiety: Secondary | ICD-10-CM | POA: Diagnosis not present

## 2021-11-19 DIAGNOSIS — R110 Nausea: Secondary | ICD-10-CM | POA: Diagnosis not present

## 2021-11-19 DIAGNOSIS — I4891 Unspecified atrial fibrillation: Secondary | ICD-10-CM | POA: Insufficient documentation

## 2021-11-19 DIAGNOSIS — F419 Anxiety disorder, unspecified: Secondary | ICD-10-CM | POA: Diagnosis not present

## 2021-11-19 DIAGNOSIS — D509 Iron deficiency anemia, unspecified: Secondary | ICD-10-CM | POA: Insufficient documentation

## 2021-11-19 DIAGNOSIS — R1310 Dysphagia, unspecified: Secondary | ICD-10-CM | POA: Diagnosis not present

## 2021-11-19 DIAGNOSIS — Z79899 Other long term (current) drug therapy: Secondary | ICD-10-CM | POA: Diagnosis not present

## 2021-11-19 HISTORY — PX: BALLOON DILATION: SHX5330

## 2021-11-19 HISTORY — PX: ESOPHAGOGASTRODUODENOSCOPY (EGD) WITH PROPOFOL: SHX5813

## 2021-11-19 HISTORY — PX: BIOPSY: SHX5522

## 2021-11-19 SURGERY — ESOPHAGOGASTRODUODENOSCOPY (EGD) WITH PROPOFOL
Anesthesia: Monitor Anesthesia Care

## 2021-11-19 MED ORDER — PROPOFOL 10 MG/ML IV BOLUS
INTRAVENOUS | Status: DC | PRN
  Administered 2021-11-19 (×2): 20 mg via INTRAVENOUS

## 2021-11-19 MED ORDER — PHENYLEPHRINE HCL (PRESSORS) 10 MG/ML IV SOLN
INTRAVENOUS | Status: DC | PRN
  Administered 2021-11-19 (×11): 160 ug via INTRAVENOUS

## 2021-11-19 MED ORDER — ONDANSETRON HCL 4 MG/2ML IJ SOLN
INTRAMUSCULAR | Status: DC | PRN
  Administered 2021-11-19: 4 mg via INTRAVENOUS

## 2021-11-19 MED ORDER — SODIUM CHLORIDE 0.9 % IV SOLN: INTRAVENOUS | Status: DC

## 2021-11-19 MED ORDER — PROPOFOL 500 MG/50ML IV EMUL
INTRAVENOUS | Status: DC | PRN
  Administered 2021-11-19: 80 ug/kg/min via INTRAVENOUS

## 2021-11-19 MED ORDER — LACTATED RINGERS IV SOLN: INTRAVENOUS | Status: DC

## 2021-11-19 SURGICAL SUPPLY — 15 items

## 2021-11-19 NOTE — Interval H&P Note (Signed)
History and Physical Interval Note:  11/19/2021 11:46 AM  Mallory Durham  has presented today for surgery, with the diagnosis of decreased appetite, early satiety, regurgitation, dysphagia.  The various methods of treatment have been discussed with the patient and family. After consideration of risks, benefits and other options for treatment, the patient has consented to  Procedure(s): ESOPHAGOGASTRODUODENOSCOPY (EGD) WITH PROPOFOL (N/A) BALLOON DILATION (N/A) as a surgical intervention.  The patient's history has been reviewed, patient examined, no change in status, stable for surgery.  I have reviewed the patient's chart and labs.  Questions were answered to the patient's satisfaction.     [name]

## 2021-11-19 NOTE — Addendum Note (Signed)
Addendum  created 11/19/21 1341 by Shanon Payor, CRNA   Intraprocedure Meds edited

## 2021-11-19 NOTE — Transfer of Care (Signed)
Immediate Anesthesia Transfer of Care Note  Patient: Mallory Durham  Procedure(s) Performed: ESOPHAGOGASTRODUODENOSCOPY (EGD) WITH PROPOFOL BALLOON DILATION BIOPSY  Patient Location: PACU  Anesthesia Type:MAC  Level of Consciousness: awake, drowsy and patient cooperative  Airway & Oxygen Therapy: Patient Spontanous Breathing and Patient connected to face mask oxygen  Post-op Assessment: Report given to RN, Post -op Vital signs reviewed and stable and Patient moving all extremities X 4  Post vital signs: Reviewed and stable  Last Vitals:  Vitals Value Taken Time  BP 109/65 11/19/21 1226  Temp    Pulse 94 11/19/21 1228  Resp 24 11/19/21 1229  SpO2 100 % 11/19/21 1228  Vitals shown include unvalidated device data.  Last Pain:  Vitals:   11/19/21 1228  TempSrc:   PainSc: 0-No pain         Complications: No notable events documented.

## 2021-11-19 NOTE — Discharge Instructions (Signed)
YOU HAD AN ENDOSCOPIC PROCEDURE TODAY: Refer to the procedure report and other information in the discharge instructions given to you for any specific questions about what was found during the examination. If this information does not answer your questions, please call [place] office at [phone] to clarify.  ° °YOU SHOULD EXPECT: Some feelings of bloating in the abdomen. Passage of more gas than usual. Walking can help get rid of the air that was put into your GI tract during the procedure and reduce the bloating. If you had a lower endoscopy (such as a colonoscopy or flexible sigmoidoscopy) you may notice spotting of blood in your stool or on the toilet paper. Some abdominal soreness may be present for a day or two, also. ° °DIET: Your first meal following the procedure should be a light meal and then it is ok to progress to your normal diet. A half-sandwich or bowl of soup is an example of a good first meal. Heavy or fried foods are harder to digest and may make you feel nauseous or bloated. Drink plenty of fluids but you should avoid alcoholic beverages for 24 hours. If you had a esophageal dilation, please see attached instructions for diet.   ° °ACTIVITY: Your care partner should take you home directly after the procedure. You should plan to take it easy, moving slowly for the rest of the day. You can resume normal activity the day after the procedure however YOU SHOULD NOT DRIVE, use power tools, machinery or perform tasks that involve climbing or major physical exertion for 24 hours (because of the sedation medicines used during the test).  ° °SYMPTOMS TO REPORT IMMEDIATELY: °A gastroenterologist can be reached at any hour. Please call [phone]  for any of the following symptoms:  °Following lower endoscopy (colonoscopy, flexible sigmoidoscopy) °Excessive amounts of blood in the stool  °Significant tenderness, worsening of abdominal pains  °Swelling of the abdomen that is new, acute  °Fever of 100° or  higher  °Following upper endoscopy (EGD, EUS, ERCP, esophageal dilation) °Vomiting of blood or coffee ground material  °New, significant abdominal pain  °New, significant chest pain or pain under the shoulder blades  °Painful or persistently difficult swallowing  °New shortness of breath  °Black, tarry-looking or red, bloody stools ° °FOLLOW UP:  °If any biopsies were taken you will be contacted by phone or by letter within the next 1-3 weeks. Call [phone]  if you have not heard about the biopsies in 3 weeks.  °Please also call with any specific questions about appointments or follow up tests. ° °

## 2021-11-19 NOTE — Op Note (Signed)
[facility] Patient Name: Mallory Durham Procedure Date: 11/19/2021 MRN: [id number] Attending MD: Doristine Locks , MD Date of Birth: 12-May-1941 CSN: [id number] Age: 81 Admit Type: Outpatient Procedure:                Upper GI endoscopy Indications:              Iron deficiency anemia, Dysphagia, Early satiety,                            Nausea Providers:                Doristine Locks, MD, Rip Harbour, RN, Salley Scarlet, Technician Referring MD:              Medicines:                Monitored Anesthesia Care Complications:            No immediate complications. Estimated Blood Loss:     Estimated blood loss was minimal. Procedure:                Pre-Anesthesia Assessment:                           - Prior to the procedure, a History and Physical                            was performed, and patient medications and                            allergies were reviewed. The patient's tolerance of                            previous anesthesia was also reviewed. The risks                            and benefits of the procedure and the sedation                            options and risks were discussed with the patient.                            All questions were answered, and informed consent                            was obtained. Prior Anticoagulants: The patient has                            taken no previous anticoagulant or antiplatelet                            agents. ASA Grade Assessment: III - A patient with                            severe systemic  disease. After reviewing the risks                            and benefits, the patient was deemed in                            satisfactory condition to undergo the procedure.                           After obtaining informed consent, the endoscope was                            passed under direct vision. Throughout the                            procedure, the patient's blood  pressure, pulse, and                            oxygen saturations were monitored continuously. The                            [id number]) Olympus endoscope was introduced                            through the mouth, and advanced to the third part                            of duodenum. The upper GI endoscopy was                            accomplished without difficulty. The patient                            tolerated the procedure well. Scope In: Scope Out: Findings:      One benign-appearing, intrinsic mild stenosis was found 35 cm from the       incisors. This stenosis measured 1 cm (in length). The stenosis was       traversed. A TTS dilator was passed through the scope. Dilation with an       18-19-20 mm balloon dilator was performed to 18 mm. The dilation site       was examined and showed mild mucosal disruption and moderate improvement       in luminal narrowing, consistent with successful dilation. The cold       forceps were then used for further fracturing of the stricture.       Estimated blood loss was minimal.      A 5 cm hiatal hernia was present. This appeared to be a type 3 hernia on       retroflexed views. There was a small amount of retained solid food in       the hernia that was lavaged into the stomach.      Localized mild inflammation characterized by congestion (edema) and       erythema was found in the prepyloric region of the stomach. Biopsies       were taken with a cold forceps for  histology (jar 2). Estimated blood       loss was minimal.      Normal mucosa was found in the gastric body and at the incisura.       Additional biopsies were taken with a cold forceps for Helicobacter       pylori testing (jar 2). Estimated blood loss was minimal.      The examined duodenum was normal. Biopsies were taken with a cold       forceps for histology (jar 1). Estimated blood loss was minimal. Impression:               - Benign-appearing esophageal stenosis.  Dilated                            with 18 mm TTS balloon with appropriate mucosal                            rent consistent with successful dilation. This was                            then fractured further using cold forceps.                           - 5 cm, type 3 hiatal hernia.                           - Mild, non-ulcer antral gastritis. Biopsied.                           - Normal mucosa was found in the gastric body and                            in the incisura. Biopsied.                           - Normal examined duodenum. Biopsied. Moderate Sedation:      Not Applicable - Patient had care per Anesthesia. Recommendation:           - Patient has a contact number available for                            emergencies. The signs and symptoms of potential                            delayed complications were discussed with the                            patient. Return to normal activities tomorrow.                            Written discharge instructions were provided to the                            patient.                           -  Soft diet today then advance as tolerated                            tomorrow per post dilation protocol.                           - Continue present medications.                           - Await pathology results.                           - Repeat upper endoscopy PRN for retreatment.                           - Return to GI clinic at appointment to be                            scheduled.                           - Recommend colonoscopy for further evaluation of                            iron deficiency anemia.                           - Based on the results of this study, no issue with                            starting anticoagulation in 2 days.                           - If dysphagia persists, can consider esophageal                            manometry and/or gastric emptying study to evaluate                            for  dysmotility as additional contributing                            etiology. Additionally, discussed possibility of                            autonomic dysfunction as contributing factor of her                            GI symptoms. Procedure Code(s):        --- Professional ---                           [phone], Esophagogastroduodenoscopy, flexible,                            transoral; with transendoscopic balloon dilation of  esophagus (less than 30 mm diameter)                           43239, 59, Esophagogastroduodenoscopy, flexible,                            transoral; with biopsy, single or multiple Diagnosis Code(s):        --- Professional ---                           K22.2, Esophageal obstruction                           K44.9, Diaphragmatic hernia without obstruction or                            gangrene                           K29.70, Gastritis, unspecified, without bleeding                           D50.9, Iron deficiency anemia, unspecified                           R13.10, Dysphagia, unspecified                           R68.81, Early satiety                           R11.0, Nausea CPT copyright 2019 American Medical [employer]. All rights reserved. The codes documented in this report are preliminary and upon coder review may  be revised to meet current compliance requirements. Doristine Locks, MD 11/19/2021 12:25:43 PM Number of Addenda: 0

## 2021-11-19 NOTE — Anesthesia Preprocedure Evaluation (Signed)
Anesthesia Evaluation  Patient identified by MRN, date of birth, ID band Patient awake    Reviewed: Allergy & Precautions, NPO status , Patient's Chart, lab work & pertinent test results  Airway Mallampati: II  TM Distance: >3 FB Neck ROM: Full    Dental no notable dental hx.    Pulmonary neg pulmonary ROS,    Pulmonary exam normal        Cardiovascular + dysrhythmias Atrial Fibrillation  Rhythm:Irregular Rate:Normal     Neuro/Psych  Headaches, Anxiety Depression TIA   GI/Hepatic negative GI ROS, Neg liver ROS,   Endo/Other  Hypothyroidism   Renal/GU Renal disease  negative genitourinary   Musculoskeletal  (+) Arthritis , Osteoarthritis,    Abdominal Normal abdominal exam  (+)   Peds  Hematology  (+) Blood dyscrasia, anemia ,   Anesthesia Other Findings   Reproductive/Obstetrics                             Anesthesia Physical Anesthesia Plan  ASA: 3  Anesthesia Plan: MAC   Post-op Pain Management:    Induction: Intravenous  PONV Risk Score and Plan: 2 and Propofol infusion and Treatment may vary due to age or medical condition  Airway Management Planned: Simple Face Mask, Natural Airway and Nasal Cannula  Additional Equipment: None  Intra-op Plan:   Post-operative Plan:   Informed Consent: I have reviewed the patients History and Physical, chart, labs and discussed the procedure including the risks, benefits and alternatives for the proposed anesthesia with the patient or authorized representative who has indicated his/her understanding and acceptance.     Dental advisory given  Plan Discussed with: CRNA  Anesthesia Plan Comments:         Anesthesia Quick Evaluation

## 2021-11-19 NOTE — H&P (Signed)
GASTROENTEROLOGY PROCEDURE H&P NOTE   Primary Care Physician: Adolph Pollack, FNP    Reason for Procedure:  Iron deficiency anemia, fatigue, heme positive stool, nausea without emesis, early satiety, decreased appetite, dysphagia  Plan:    EGD with possible biopsy and/or dilation  Patient is appropriate for endoscopic procedure(s) in the ambulatory ([facility]) setting.  The nature of the procedure, as well as the risks, benefits, and alternatives were carefully and thoroughly reviewed with the patient. Ample time for discussion and questions allowed. The patient understood, was satisfied, and agreed to proceed.     HPI: Mallory Durham is a 81 y.o. female who presents for EGD for evaluation of iron deficiency anemia and heme positive stool along with multiple upper GI symptoms, to include nausea, early satiety, decreased appetite, and more recently dysphagia.  Was initially seen in the GI clinic on 09/21/2021 and had intended on upper endoscopy and colonoscopy.  However, due to concerns about tolerating bowel preparation, colonoscopy portion was postponed in favor of upper endoscopy alone.  Has received IV iron.  Since that appointment, has been seen in the ER multiple times for various issues, most recently on 11/10/2021 for nausea/vomiting, decreased p.o. intake, and dysphagia to solids and liquids.  Was seen by her Cardiologist yesterday.  Does have a history of autonomic failure/dysfunction, along with atrial fibrillation.  Not currently on any anticoagulation, but considering initiation depending on results today with potential plan for cardioversion 3-4 weeks afterwards.  Provided with Cardiac clearance to proceed with upper endoscopy today.  Past Medical History:  Diagnosis Date   2019 novel coronavirus disease (COVID-19)    A-fib (HCC)    Anxiety    Confusion, hx of, without neuro findings    Depression    Hx of vertigo    Hyperlipidemia    Hypothyroid    Left shoulder pain     Migraines    Neck pain    Orthostatic hypotension    Renal insufficiency     Past Surgical History:  Procedure Laterality Date   ABDOMINAL HYSTERECTOMY     ABDOMINAL HYSTERECTOMY     BREAST LUMPECTOMY     CHOLECYSTECTOMY     COLONOSCOPY     Done in [facility] [place] about 15 years ago   JOINT REPLACEMENT     THYROID SURGERY     TONSILLECTOMY      Prior to Admission medications   Medication Sig Start Date End Date Taking? Authorizing Provider  buPROPion (WELLBUTRIN XL) 150 MG 24 hr tablet Take 1 tablet (150 mg total) by mouth every morning. 11/03/21  Yes   diphenhydrAMINE (BENADRYL) 25 mg capsule Take 50 mg by mouth See admin instructions. Take 50 mg at night, may take 50 mg during the day as needed for itching, migraine, insomina   Yes [provider]  esomeprazole (NEXIUM) 20 MG capsule Take 20 mg by mouth at bedtime.   Yes [provider]  fludrocortisone (FLORINEF) 0.1 MG tablet Take 1 tablet (0.1 mg total) by mouth 2 (two) times daily. 11/04/21 11/04/22 Yes Levert Feinstein, MD  gabapentin (NEURONTIN) 100 MG capsule Take 3 capsules (300 mg total) by mouth 3 (three) times daily as needed. 11/06/21  Yes Levert Feinstein, MD  metoCLOPramide (REGLAN) 5 MG tablet Take 1 tablet (5 mg total) by mouth every 8 (eight) hours as needed for nausea. Patient taking differently: Take 5 mg by mouth 3 (three) times daily before meals. 11/04/21  Yes Levert Feinstein, MD  metoprolol succinate (TOPROL  XL) 25 MG 24 hr tablet Take 1/2 tablet in the am and 1 tablet in the pm 11/06/21  Yes Sherran Needs, NP  midodrine (PROAMATINE) 10 MG tablet Take 0.5 tablets (5 mg total) by mouth 2 (two) times daily. 11/18/21  Yes Deboraha Sprang, MD  ondansetron (ZOFRAN-ODT) 8 MG disintegrating tablet Take 1 tablet (8 mg total) by mouth every 8 (eight) hours as needed for nausea or vomiting. 11/04/21  Yes Marcial Pacas, MD  Polyethyl Glycol-Propyl Glycol (SYSTANE OP) Place 1 drop into both eyes daily as needed (dry  eyes).   Yes [provider]  pravastatin (PRAVACHOL) 20 MG tablet Take 1 tablet (20 mg total) by mouth nightly. 06/19/21  Yes   pyridostigmine (MESTINON) 60 MG tablet Take 1 tablet (60 mg total) by mouth 3 (three) times daily as needed. 11/18/21  Yes Deboraha Sprang, MD  sertraline (ZOLOFT) 100 MG tablet Take 1 tablet by mouth in the morning 06/19/21  Yes   SYNTHROID 88 MCG tablet Take 1 tablet (88 mcg total) by mouth daily before breakfast. 07/07/21  Yes Philemon Kingdom, MD  tiZANidine (ZANAFLEX) 2 MG tablet Take 1 tablet (2 mg total) by mouth every 8 (eight) hours as needed for muscle spasms. Patient taking differently: Take 2 mg by mouth See admin instructions. Take 2 mg at night, may take an additional 2 mg dose up to 2 times a day as needed for migraines 11/04/21 11/04/22 Yes Marcial Pacas, MD  Vitamin D, Ergocalciferol, (DRISDOL) 1.25 MG (50000 UNIT) CAPS capsule Take 1 capsule (50,000 Units total) by mouth every 7 (seven) days. 11/04/21  Yes Marcial Pacas, MD  aspirin-acetaminophen-caffeine (EXCEDRIN MIGRAINE) [phone] MG tablet Take 1-2 tablets by mouth every 6 (six) hours as needed for headache.    [provider]  botulinum toxin Type A (BOTOX) 100 units SOLR injection Inject IM every 3 months by MD in the office Patient taking differently: Inject 1 dose every 3 months by MD in the office 12/07/17   Marcial Pacas, MD  Multiple Vitamins-Minerals (PRESERVISION AREDS 2) CAPS Take 1 capsule by mouth 2 (two) times daily.    [provider]  SUMAtriptan (IMITREX) 100 MG tablet Take 1 tab at onset of migraine.  May repeat in 2 hrs, if needed.  Max dose: 2 tabs/day. This is a 30 day prescription. 07/22/21   Marcial Pacas, MD    No current facility-administered medications for this encounter.    Allergies as of 11/09/2021   (No Known Allergies)    Family History  Problem Relation Age of Onset   Heart disease Mother        Angina   Cancer Mother        Breast   Cancer Father         Prostate   Heart disease Father    Cancer Maternal Aunt    Colon cancer Neg Hx    Esophageal cancer Neg Hx     Social History   Socioeconomic History   Marital status: Married    Spouse name: Not on file   Number of children: 2   Years of education: 16   Highest education level: Bachelor's degree (e.g., BA, AB, BS)  Occupational History    Employer: RETIRED  Tobacco Use   Smoking status: Never   Smokeless tobacco: Never   Tobacco comments:    Never smoke 10/08/21  Vaping Use   Vaping Use: Never used  Substance and Sexual Activity   Alcohol use: No  Drug use: No   Sexual activity: Not on file  Other Topics Concern   Not on file  Social History Narrative   Lives at home alone.   Right-handed.   1-2 cups caffeine per day.   Social Determinants of Health   Financial Resource Strain: Not on file  Food Insecurity: Not on file  Transportation Needs: Not on file  Physical Activity: Not on file  Stress: Not on file  Social Connections: Not on file  Intimate Partner Violence: Not on file    Physical Exam: Vital signs in last 24 hours: @There  were no vitals taken for this visit. GEN: NAD EYE: Sclerae anicteric ENT: MMM CV: Non-tachycardic Pulm: CTA b/l GI: Soft, NT/ND NEURO:  Alert & Oriented x 3   , DO [facility]   11/19/2021 11:29 AM

## 2021-11-19 NOTE — Anesthesia Postprocedure Evaluation (Signed)
Anesthesia Post Note  Patient: Mallory Durham  Procedure(s) Performed: ESOPHAGOGASTRODUODENOSCOPY (EGD) WITH PROPOFOL BALLOON DILATION BIOPSY     Patient location during evaluation: Endoscopy Anesthesia Type: MAC Level of consciousness: awake and alert Pain management: pain level controlled Vital Signs Assessment: post-procedure vital signs reviewed and stable Respiratory status: spontaneous breathing, nonlabored ventilation, respiratory function stable and patient connected to nasal cannula oxygen Cardiovascular status: stable and blood pressure returned to baseline Postop Assessment: no apparent nausea or vomiting Anesthetic complications: no   No notable events documented.  Last Vitals:  Vitals:   11/19/21 1242 11/19/21 1256  BP: 94/60 100/81  Pulse: 87 93  Resp: 16 16  Temp:    SpO2:      Last Pain:  Vitals:   11/19/21 1256  TempSrc:   PainSc: 0-No pain                 Belenda Cruise P Kemya Shed

## 2021-11-20 LAB — SURGICAL PATHOLOGY

## 2021-11-23 ENCOUNTER — Ambulatory Visit (HOSPITAL_COMMUNITY): Payer: PPO

## 2021-11-23 ENCOUNTER — Telehealth: Payer: Self-pay | Admitting: Neurology

## 2021-12-01 ENCOUNTER — Encounter: Payer: Self-pay | Admitting: Gastroenterology

## 2021-12-08 ENCOUNTER — Inpatient Hospital Stay: Payer: PPO

## 2021-12-08 ENCOUNTER — Inpatient Hospital Stay: Payer: PPO | Admitting: Family

## 2021-12-15 NOTE — Telephone Encounter (Signed)
Left message for a return call

## 2021-12-15 NOTE — Telephone Encounter (Signed)
I spoke to [place]. Reports her mother was not feeling well this morning. Got up, turned on the TV, laid down on couch. Her grandson offered to get her water, barely able to drink it. She asked him to get [place]. Angelique Blonder went in to check on her. Noted agonal breathing and her mother passed shortly after. EMS was called. Time of death at 7:02am. Family does not wish to pursue an autopsy. She was very thankful for the care provided by her [facility] physicians.

## 2021-12-15 NOTE — Telephone Encounter (Signed)
Pt's daughter, Harless Litten, pt passed this morning. Would like to speak with Marcelino Duster.

## 2021-12-15 NOTE — Telephone Encounter (Signed)
Pt's daughter is asking for a call back from Michelle,RN °

## 2021-12-15 DEATH — deceased

## 2022-01-06 ENCOUNTER — Ambulatory Visit: Payer: PPO | Admitting: Neurology

## 2022-02-09 ENCOUNTER — Ambulatory Visit: Payer: PPO | Admitting: Internal Medicine

## 2022-02-15 ENCOUNTER — Ambulatory Visit: Payer: PPO | Admitting: Neurology

## 2022-02-25 ENCOUNTER — Other Ambulatory Visit (HOSPITAL_COMMUNITY): Payer: Self-pay

## 2022-03-25 ENCOUNTER — Other Ambulatory Visit (HOSPITAL_COMMUNITY): Payer: Self-pay

## 2022-04-24 IMAGING — DX DG CHEST 2V
2 series · 2 of 2 positions shown · non-contrast
Comparison: 06/27/2017

CLINICAL DATA: Lung infection. Cough, nasal congestion, wheezing,
and rales.

EXAM:
CHEST - 2 VIEW

[chest lat]
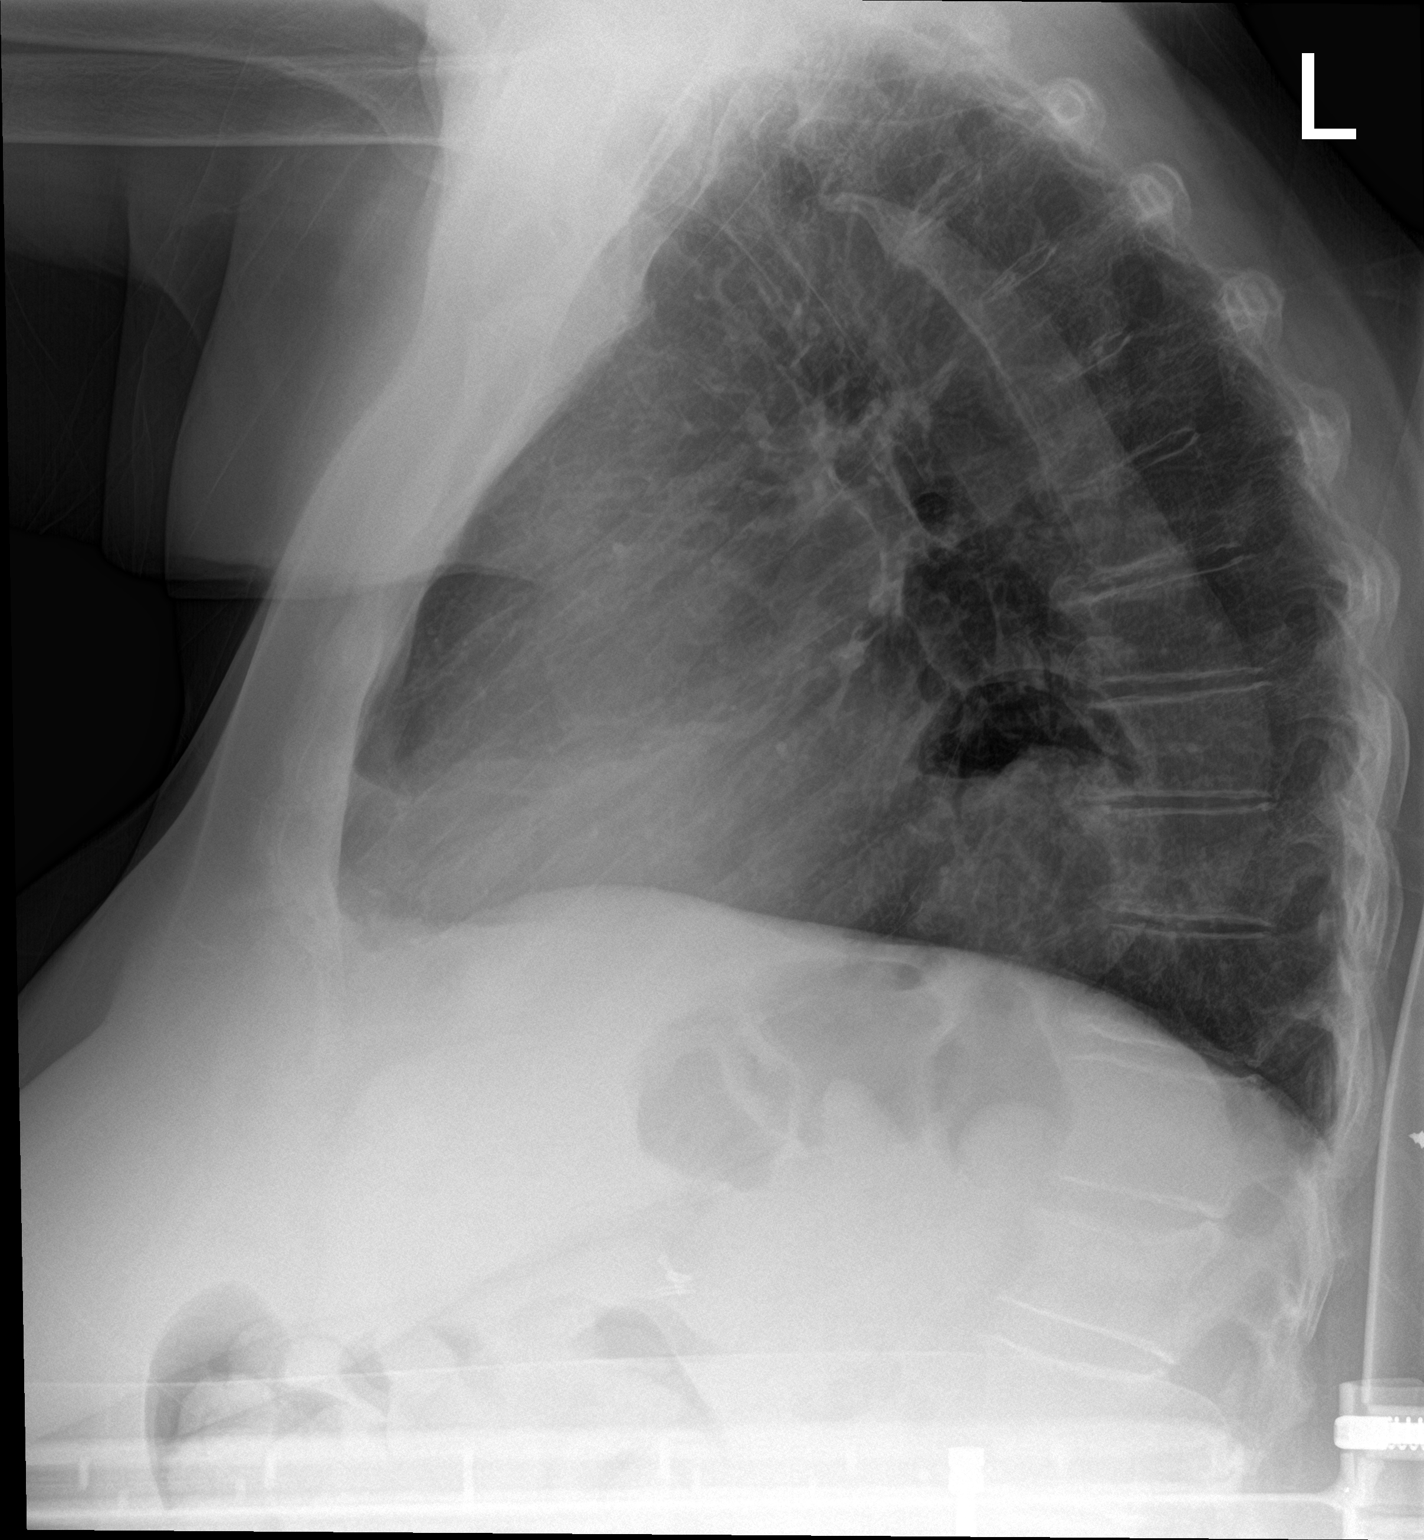

[chest ap]
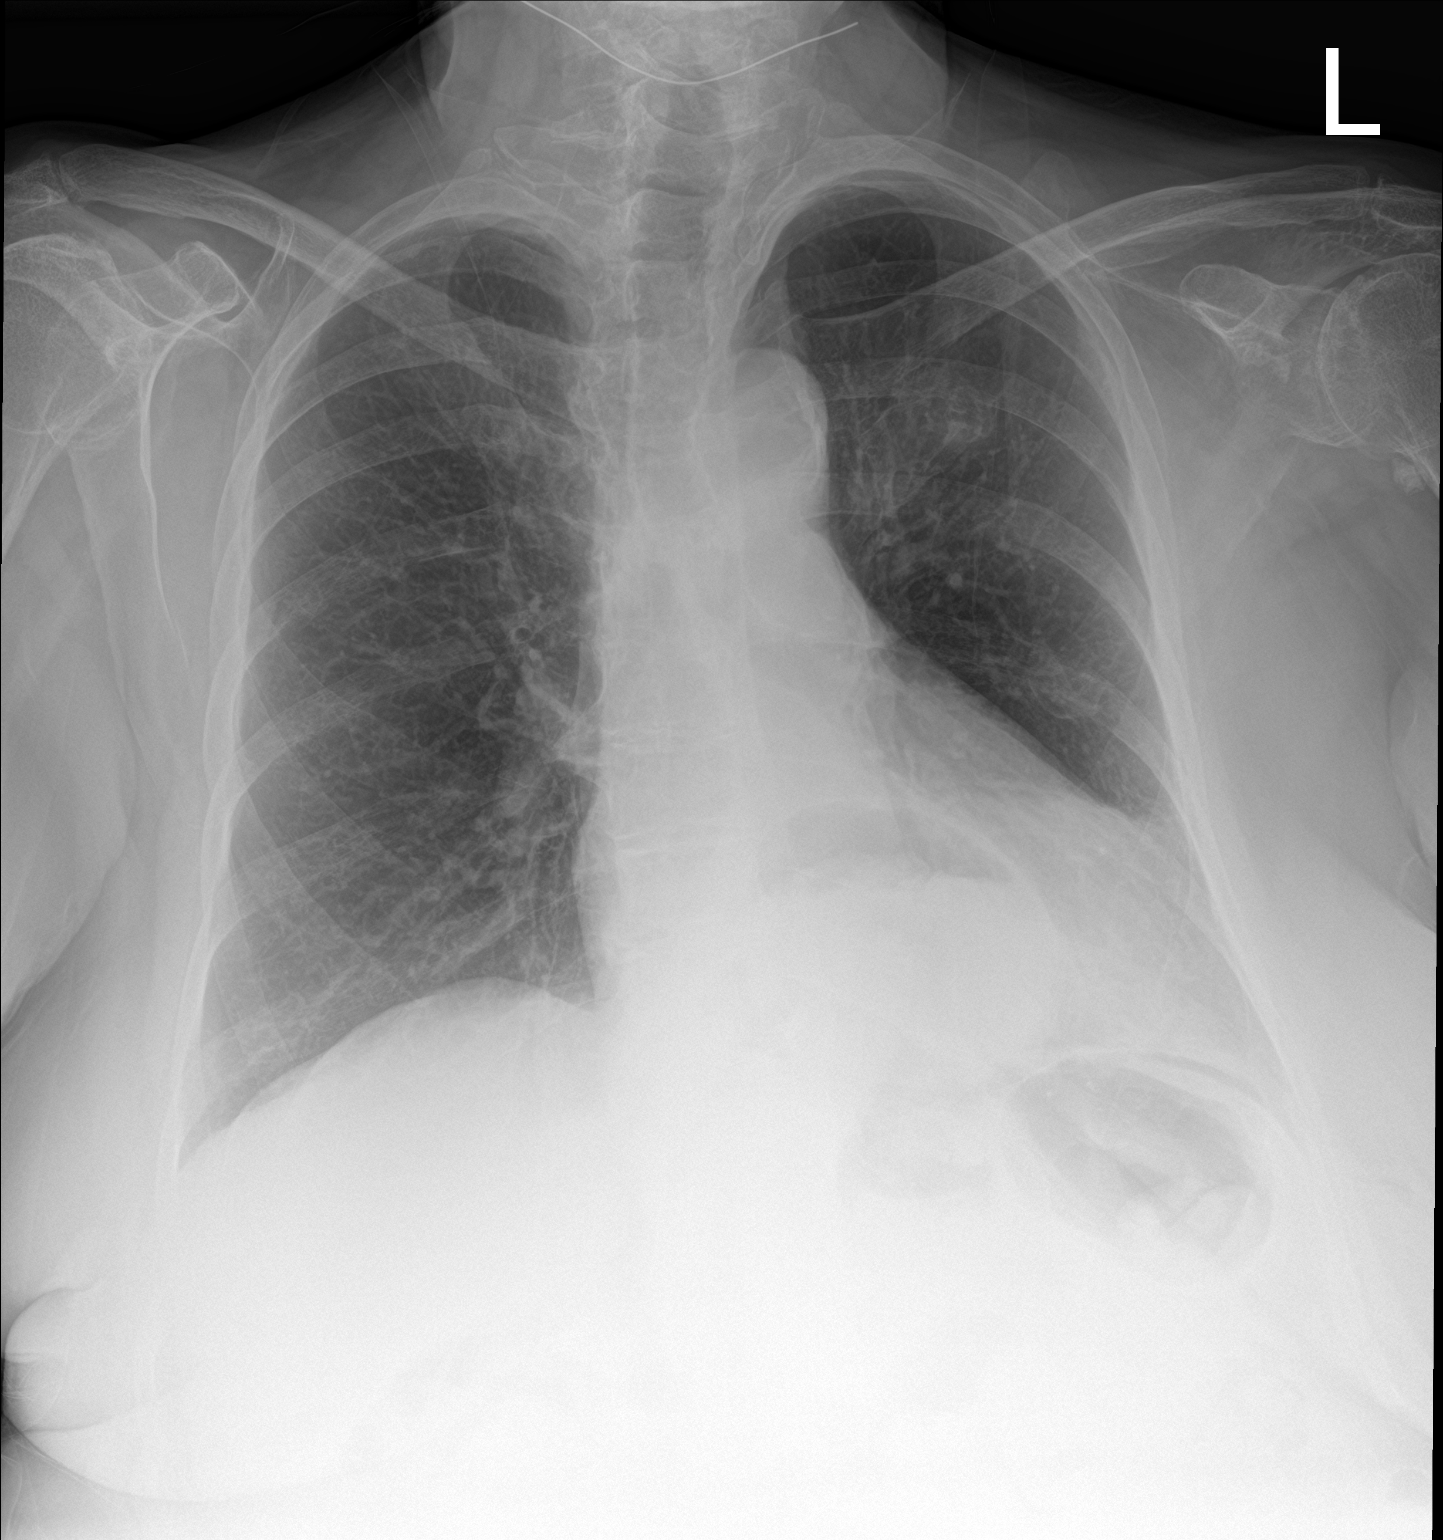

[2 of 2 positions shown; findings below may reference images not displayed]

FINDINGS: The cardiac silhouette is borderline enlarged. There is a moderately
large hiatal hernia which was not evident on the prior study. No
airspace consolidation, edema, pleural effusion, or pneumothorax is
identified. No acute osseous abnormality is seen.
IMPRESSION: Moderately large hiatal hernia. No evidence of active
cardiopulmonary disease.

## 2022-04-29 ENCOUNTER — Other Ambulatory Visit (HOSPITAL_COMMUNITY): Payer: Self-pay
